# Patient Record
Sex: Male | Born: 1950 | Race: Black or African American | Hispanic: No | Smoking: Current every day smoker
Health system: Southern US, Community
[De-identification: ages and names within clinical notes are randomized; demographics above are authoritative.]

## PROBLEM LIST (undated history)

## (undated) DIAGNOSIS — I509 Heart failure, unspecified: Secondary | ICD-10-CM

## (undated) DIAGNOSIS — E119 Type 2 diabetes mellitus without complications: Secondary | ICD-10-CM

## (undated) DIAGNOSIS — I729 Aneurysm of unspecified site: Secondary | ICD-10-CM

## (undated) DIAGNOSIS — M1990 Unspecified osteoarthritis, unspecified site: Secondary | ICD-10-CM

## (undated) DIAGNOSIS — G4730 Sleep apnea, unspecified: Secondary | ICD-10-CM

## (undated) DIAGNOSIS — R011 Cardiac murmur, unspecified: Secondary | ICD-10-CM

## (undated) HISTORY — DX: Aneurysm of unspecified site: I72.9

## (undated) HISTORY — PX: BUNIONECTOMY: SHX129

---

## 2008-02-21 HISTORY — PX: CARDIAC CATHETERIZATION: SHX172

## 2008-05-09 ENCOUNTER — Inpatient Hospital Stay: Payer: Self-pay | Admitting: Internal Medicine

## 2009-05-25 ENCOUNTER — Encounter: Payer: Self-pay | Admitting: Internal Medicine

## 2010-02-20 HISTORY — PX: OTHER SURGICAL HISTORY: SHX169

## 2010-11-23 ENCOUNTER — Ambulatory Visit: Payer: Self-pay | Admitting: Gastroenterology

## 2012-05-10 ENCOUNTER — Other Ambulatory Visit: Payer: Self-pay | Admitting: Ophthalmology

## 2012-05-10 NOTE — H&P (Signed)
  Pre-operative History and Physical for Ophthalmic Surgery  Jeremy Gutierrez 05/10/2012                  Chief Complaint: Decreased Vision Right Eye  Diagnosis:  Combined Cataract  No known allergies  Prior to Admission medications   Not on File    Planned Procedure:                                       Phacoemulsification, Posterior Chamber Intra-ocular Lens Right Eye  There were no vitals filed for this visit.  Pulse: 74         Temp: NE        Resp:  18      ROS: negative   History   Social History  . Marital Status: N/A    Spouse Name: N/A    Number of Children: N/A  . Years of Education: N/A   Occupational History  . Not on file.   Social History Main Topics  . Smoking status: Not on file  . Smokeless tobacco: Not on file  . Alcohol Use: Not on file  . Drug Use: Not on file  . Sexually Active: Not on file   Other Topics Concern  . Not on file   Social History Narrative  . No narrative on file     The following examination is for anesthesia clearance for minimally invasive Ophthalmic surgery. It is primarily to document heart and lung findings and is not intended to elucidate unknown general medical conditions inclusive of abdominal masses, lung lesions, etc.   General Constitution:  within normal limits   Alertness/Orientation:  Person, time place     yes   HEENT:  Eye Findings:  Combined Cataract                   right eye  Neck: supple without masses  Chest/Lungs: clear to auscultation  Cardiac: Normal S1 and S2 without Murmur, S3 or S4  Neuro: non-focal  Impression:  Combined Cataract  Right Eye  Planned Procedure:  Phacoemulsification, Posterior Chamber Intraocular Lens OD    Shade Flood, MD

## 2012-06-03 ENCOUNTER — Encounter (HOSPITAL_COMMUNITY): Payer: Self-pay

## 2012-06-03 ENCOUNTER — Encounter (HOSPITAL_COMMUNITY): Payer: Self-pay | Admitting: *Deleted

## 2012-06-05 ENCOUNTER — Encounter (HOSPITAL_COMMUNITY)
Admission: RE | Disposition: A | Discharge status: Home / Self Care | Payer: Self-pay | Source: Ambulatory Visit | Attending: Ophthalmology

## 2012-06-05 ENCOUNTER — Ambulatory Visit (HOSPITAL_COMMUNITY)
Admission: RE | Admit: 2012-06-05 | Discharge: 2012-06-05 | Disposition: A | Discharge status: Home / Self Care | Payer: Medicare Other | Source: Ambulatory Visit | Attending: Ophthalmology | Admitting: Ophthalmology

## 2012-06-05 ENCOUNTER — Ambulatory Visit (HOSPITAL_COMMUNITY): Payer: Medicare Other | Admitting: Anesthesiology

## 2012-06-05 ENCOUNTER — Ambulatory Visit (HOSPITAL_COMMUNITY): Payer: Medicare Other

## 2012-06-05 ENCOUNTER — Encounter (HOSPITAL_COMMUNITY): Payer: Self-pay | Admitting: Anesthesiology

## 2012-06-05 DIAGNOSIS — H2510 Age-related nuclear cataract, unspecified eye: Secondary | ICD-10-CM | POA: Insufficient documentation

## 2012-06-05 DIAGNOSIS — H269 Unspecified cataract: Secondary | ICD-10-CM | POA: Insufficient documentation

## 2012-06-05 HISTORY — DX: Type 2 diabetes mellitus without complications: E11.9

## 2012-06-05 HISTORY — DX: Cardiac murmur, unspecified: R01.1

## 2012-06-05 HISTORY — DX: Unspecified osteoarthritis, unspecified site: M19.90

## 2012-06-05 HISTORY — DX: Sleep apnea, unspecified: G47.30

## 2012-06-05 HISTORY — PX: CATARACT EXTRACTION W/PHACO: SHX586

## 2012-06-05 HISTORY — DX: Heart failure, unspecified: I50.9

## 2012-06-05 LAB — BASIC METABOLIC PANEL
Calcium: 9.2 mg/dL (ref 8.4–10.5)
Creatinine, Ser: 1.15 mg/dL (ref 0.50–1.35)
GFR calc non Af Amer: 66 mL/min — ABNORMAL LOW (ref >90)
Sodium: 138 mEq/L (ref 135–145)

## 2012-06-05 LAB — CBC
MCH: 31.8 pg (ref 26.0–34.0)
MCHC: 35.6 g/dL (ref 30.0–36.0)
MCV: 89.2 fL (ref 78.0–100.0)
Platelets: 155 10*3/uL (ref 150–400)
RDW: 14 % (ref 11.5–15.5)

## 2012-06-05 LAB — GLUCOSE, CAPILLARY: Glucose-Capillary: 126 mg/dL — ABNORMAL HIGH (ref 70–99)

## 2012-06-05 SURGERY — PHACOEMULSIFICATION, CATARACT, WITH IOL INSERTION
Anesthesia: Monitor Anesthesia Care | Site: Eye | Laterality: Right | Wound class: Clean

## 2012-06-05 MED ORDER — PROVISC 10 MG/ML IO SOLN
INTRAOCULAR | Status: DC | PRN
  Administered 2012-06-05: 8.5 mg via INTRAOCULAR

## 2012-06-05 MED ORDER — DEXAMETHASONE SODIUM PHOSPHATE 10 MG/ML IJ SOLN
INTRAMUSCULAR | Status: DC | PRN
  Administered 2012-06-05: 10 mg via INTRAVENOUS

## 2012-06-05 MED ORDER — ONDANSETRON HCL 4 MG/2ML IJ SOLN: 4.0000 mg | Freq: Four times a day (QID) | INTRAMUSCULAR | Status: DC | PRN

## 2012-06-05 MED ORDER — PROPOFOL 10 MG/ML IV BOLUS
INTRAVENOUS | Status: DC | PRN
  Administered 2012-06-05: 20 mg via INTRAVENOUS
  Administered 2012-06-05: 40 mg via INTRAVENOUS

## 2012-06-05 MED ORDER — HYPROMELLOSE (GONIOSCOPIC) 2.5 % OP SOLN
OPHTHALMIC | Status: DC | PRN
  Administered 2012-06-05: 2 [drp] via OPHTHALMIC

## 2012-06-05 MED ORDER — SODIUM CHLORIDE 0.9 % IV SOLN
INTRAVENOUS | Status: DC | PRN
  Administered 2012-06-05: 12:00:00 via INTRAVENOUS

## 2012-06-05 MED ORDER — EPINEPHRINE HCL 1 MG/ML IJ SOLN
INTRAOCULAR | Status: DC | PRN
  Administered 2012-06-05: 12:00:00

## 2012-06-05 MED ORDER — OXYCODONE HCL 5 MG PO TABS: 5.0000 mg | ORAL_TABLET | Freq: Once | ORAL | Status: DC | PRN

## 2012-06-05 MED ORDER — NA CHONDROIT SULF-NA HYALURON 40-30 MG/ML IO SOLN
INTRAOCULAR | Status: DC | PRN
  Administered 2012-06-05: 0.5 mL via INTRAOCULAR

## 2012-06-05 MED ORDER — WATER FOR IRRIGATION, STERILE IR SOLN
Status: DC | PRN
  Administered 2012-06-05: 1000 mL

## 2012-06-05 MED ORDER — CEFAZOLIN SUBCONJUNCTIVAL INJECTION 100 MG/0.5 ML
INJECTION | SUBCONJUNCTIVAL | Status: DC | PRN
  Administered 2012-06-05: 200 mg via SUBCONJUNCTIVAL

## 2012-06-05 MED ORDER — BACITRACIN-POLYMYXIN B 500-10000 UNIT/GM OP OINT
TOPICAL_OINTMENT | OPHTHALMIC | Status: DC | PRN
  Administered 2012-06-05: 1 via OPHTHALMIC

## 2012-06-05 MED ORDER — FENTANYL CITRATE 0.05 MG/ML IJ SOLN: 25.0000 ug | INTRAMUSCULAR | Status: DC | PRN

## 2012-06-05 MED ORDER — PHENYLEPHRINE HCL 2.5 % OP SOLN
1.0000 [drp] | OPHTHALMIC | Status: AC | PRN
  Administered 2012-06-05 (×3): 1 [drp] via OPHTHALMIC
  Filled 2012-06-05: qty 3

## 2012-06-05 MED ORDER — LIDOCAINE HCL 2 % IJ SOLN
INTRAMUSCULAR | Status: DC | PRN
  Administered 2012-06-05: 12:00:00 via RETROBULBAR

## 2012-06-05 MED ORDER — TETRACAINE HCL 0.5 % OP SOLN
2.0000 [drp] | OPHTHALMIC | Status: AC
  Administered 2012-06-05: 2 [drp] via OPHTHALMIC
  Filled 2012-06-05: qty 2

## 2012-06-05 MED ORDER — GATIFLOXACIN 0.5 % OP SOLN
1.0000 [drp] | OPHTHALMIC | Status: AC | PRN
  Administered 2012-06-05 (×3): 1 [drp] via OPHTHALMIC
  Filled 2012-06-05 (×2): qty 2.5

## 2012-06-05 MED ORDER — CEFAZOLIN SUBCONJUNCTIVAL INJECTION 100 MG/0.5 ML
200.0000 mg | INJECTION | SUBCONJUNCTIVAL | Status: DC
  Filled 2012-06-05: qty 1

## 2012-06-05 MED ORDER — OXYCODONE HCL 5 MG/5ML PO SOLN: 5.0000 mg | Freq: Once | ORAL | Status: DC | PRN

## 2012-06-05 MED ORDER — PREDNISOLONE ACETATE 1 % OP SUSP
1.0000 [drp] | OPHTHALMIC | Status: AC
  Administered 2012-06-05: 1 [drp] via OPHTHALMIC
  Filled 2012-06-05 (×2): qty 5

## 2012-06-05 SURGICAL SUPPLY — 61 items
APPLICATOR COTTON TIP 6IN STRL (MISCELLANEOUS) ×2 IMPLANT
APPLICATOR DR MATTHEWS STRL (MISCELLANEOUS) ×2 IMPLANT
BAG MINI COLL DRAIN (WOUND CARE) ×2 IMPLANT
BLADE EYE MINI 60D BEAVER (BLADE) IMPLANT
BLADE KERATOME 2.75 (BLADE) ×2 IMPLANT
BLADE STAB KNIFE 15DEG (BLADE) IMPLANT
CANNULA ANTERIOR CHAMBER 27GA (MISCELLANEOUS) IMPLANT
CLOTH BEACON ORANGE TIMEOUT ST (SAFETY) ×2 IMPLANT
DRAPE OPHTHALMIC 77X100 STRL (CUSTOM PROCEDURE TRAY) ×2 IMPLANT
DRAPE POUCH INSTRU U-SHP 10X18 (DRAPES) ×2 IMPLANT
DRSG TEGADERM 4X4.75 (GAUZE/BANDAGES/DRESSINGS) ×2 IMPLANT
FILTER BLUE MILLIPORE (MISCELLANEOUS) IMPLANT
GLOVE ECLIPSE 7.5 STRL STRAW (GLOVE) ×2 IMPLANT
GLOVE SS BIOGEL STRL SZ 6.5 (GLOVE) ×2 IMPLANT
GLOVE SUPERSENSE BIOGEL SZ 6.5 (GLOVE) ×2
GOWN SRG XL XLNG 56XLVL 4 (GOWN DISPOSABLE) ×1 IMPLANT
GOWN STRL NON-REIN LRG LVL3 (GOWN DISPOSABLE) ×4 IMPLANT
GOWN STRL NON-REIN XL XLG LVL4 (GOWN DISPOSABLE) ×1
KIT BASIN OR (CUSTOM PROCEDURE TRAY) ×2 IMPLANT
KIT ROOM TURNOVER OR (KITS) IMPLANT
KNIFE GRIESHABER SHARP 2.5MM (MISCELLANEOUS) ×2 IMPLANT
LENS IOL ACRYSOF MP POST 21.0 (Intraocular Lens) ×2 IMPLANT
MASK EYE SHIELD (GAUZE/BANDAGES/DRESSINGS) ×2 IMPLANT
NEEDLE 18GX1X1/2 (RX/OR ONLY) (NEEDLE) ×4 IMPLANT
NEEDLE 22X1 1/2 (OR ONLY) (NEEDLE) ×2 IMPLANT
NEEDLE 25GX 5/8IN NON SAFETY (NEEDLE) ×2 IMPLANT
NEEDLE FILTER BLUNT 18X 1/2SAF (NEEDLE)
NEEDLE FILTER BLUNT 18X1 1/2 (NEEDLE) IMPLANT
NEEDLE HYPO 30X.5 LL (NEEDLE) ×4 IMPLANT
NS IRRIG 1000ML POUR BTL (IV SOLUTION) ×2 IMPLANT
PACK CATARACT CUSTOM (CUSTOM PROCEDURE TRAY) ×2 IMPLANT
PACK CATARACT MCHSCP (PACKS) ×2 IMPLANT
PACK COMBINED CATERACT/VIT 23G (OPHTHALMIC RELATED) IMPLANT
PAD ARMBOARD 7.5X6 YLW CONV (MISCELLANEOUS) ×4 IMPLANT
PAD EYE OVAL STERILE LF (GAUZE/BANDAGES/DRESSINGS) ×2 IMPLANT
PHACO TIP KELMAN 45DEG (TIP) ×2 IMPLANT
PROBE ANTERIOR 20G W/INFUS NDL (MISCELLANEOUS) IMPLANT
RING MALYGIN (MISCELLANEOUS) IMPLANT
ROLLS DENTAL (MISCELLANEOUS) IMPLANT
SHUTTLE MONARCH TYPE A (NEEDLE) ×2 IMPLANT
SOLUTION ANTI FOG 6CC (MISCELLANEOUS) IMPLANT
SPEAR EYE SURG WECK-CEL (MISCELLANEOUS) ×4 IMPLANT
SUT ETHILON 10-0 CS-B-6CS-B-6 (SUTURE)
SUT ETHILON 5 0 P 3 18 (SUTURE)
SUT ETHILON 9 0 TG140 8 (SUTURE) IMPLANT
SUT NYLON ETHILON 5-0 P-3 1X18 (SUTURE) IMPLANT
SUT PLAIN 6 0 TG1408 (SUTURE) IMPLANT
SUT POLY NON ABSORB 10-0 8 STR (SUTURE) IMPLANT
SUT VICRYL 6 0 S 29 12 (SUTURE) IMPLANT
SUTURE EHLN 10-0 CS-B-6CS-B-6 (SUTURE) IMPLANT
SYR 20CC LL (SYRINGE) IMPLANT
SYR 5ML LL (SYRINGE) IMPLANT
SYR TB 1ML LUER SLIP (SYRINGE) ×2 IMPLANT
SYRINGE 10CC LL (SYRINGE) IMPLANT
TAPE SURG TRANSPORE 1 IN (GAUZE/BANDAGES/DRESSINGS) ×1 IMPLANT
TAPE SURGICAL TRANSPORE 1 IN (GAUZE/BANDAGES/DRESSINGS) ×1
TIP ABS 45DEG FLARED 0.9MM (TIP) ×2 IMPLANT
TOWEL OR 17X24 6PK STRL BLUE (TOWEL DISPOSABLE) ×4 IMPLANT
WATER STERILE IRR 1000ML POUR (IV SOLUTION) ×2 IMPLANT
WIPE INSTRUMENT ADHESIVE BACK (MISCELLANEOUS) ×2 IMPLANT
WIPE INSTRUMENT VISIWIPE 73X73 (MISCELLANEOUS) ×2 IMPLANT

## 2012-06-05 NOTE — Interval H&P Note (Signed)
History and Physical Interval Note:  06/05/2012 11:44 AM  Jeremy Gutierrez  has presented today for surgery, with the diagnosis of Combined Cataract Right Eye  The various methods of treatment have been discussed with the patient and family. After consideration of risks, benefits and other options for treatment, the patient has consented to  Procedure(s): CATARACT EXTRACTION PHACO AND INTRAOCULAR LENS PLACEMENT (IOC) (Right) as a surgical intervention .  The patient's history has been reviewed, patient examined, no change in status, stable for surgery.  I have reviewed the patient's chart and labs.  Questions were answered to the patient's satisfaction.     Kenzie Thoreson, Waynette Buttery

## 2012-06-05 NOTE — Anesthesia Postprocedure Evaluation (Signed)
Anesthesia Post Note  Patient: Jeremy Gutierrez  Procedure(s) Performed: Procedure(s) (LRB): CATARACT EXTRACTION PHACO AND INTRAOCULAR LENS PLACEMENT (IOC) (Right)  Anesthesia type: MAC  Patient location: PACU  Post pain: Pain level controlled  Post assessment: Patient's Cardiovascular Status Stable  Last Vitals:  Filed Vitals:   06/05/12 1328  BP: 114/73  Pulse: 66  Temp: 36.4 C  Resp: 16    Post vital signs: Reviewed and stable  Level of consciousness: sedated  Complications: No apparent anesthesia complications

## 2012-06-05 NOTE — H&P (View-Only) (Signed)
  Pre-operative History and Physical for Ophthalmic Surgery  Jeremy Gutierrez 05/10/2012                  Chief Complaint: Decreased Vision Right Eye  Diagnosis:  Combined Cataract  No known allergies  Prior to Admission medications   Not on File    Planned Procedure:                                       Phacoemulsification, Posterior Chamber Intra-ocular Lens Right Eye  There were no vitals filed for this visit.  Pulse: 74         Temp: NE        Resp:  18      ROS: negative   History   Social History  . Marital Status: N/A    Spouse Name: N/A    Number of Children: N/A  . Years of Education: N/A   Occupational History  . Not on file.   Social History Main Topics  . Smoking status: Not on file  . Smokeless tobacco: Not on file  . Alcohol Use: Not on file  . Drug Use: Not on file  . Sexually Active: Not on file   Other Topics Concern  . Not on file   Social History Narrative  . No narrative on file     The following examination is for anesthesia clearance for minimally invasive Ophthalmic surgery. It is primarily to document heart and lung findings and is not intended to elucidate unknown general medical conditions inclusive of abdominal masses, lung lesions, etc.   General Constitution:  within normal limits   Alertness/Orientation:  Person, time place     yes   HEENT:  Eye Findings:  Combined Cataract                   right eye  Neck: supple without masses  Chest/Lungs: clear to auscultation  Cardiac: Normal S1 and S2 without Murmur, S3 or S4  Neuro: non-focal  Impression:  Combined Cataract  Right Eye  Planned Procedure:  Phacoemulsification, Posterior Chamber Intraocular Lens OD    Talar Fraley, MD       

## 2012-06-05 NOTE — Op Note (Signed)
Jeremy Gutierrez 06/05/2012 Cataract: Combined, Nuclear, Cortical and Posterior Subcapsular  Procedure: Phacoemulsification, Posterior Chamber Intra-ocular Lens Operative Eye:  right eye  Surgeon: Shade Flood Estimated Blood Loss: minimal Specimens for Pathology:  None Complications: none  The patient was prepared and draped in the usual manner for ocular surgery on the right eye. A Cook lid speculum was placed. A peripheral clear corneal incision was made at the surgical limbus centered at the 11:00 meridian. A separate clear corneal stab incision was made with a 15 degree blade at the 2:00 meridian to permit bi-manual technique. Viscoat and  Provisc as an underlying layer next to the capsule was instilled into the anterior chamber through that incision.  A keratome was used to create a self sealing incision entering the anterior chamber at the 11:00 meridian. A capsulorhexis was performed using a bent 25g needle. The lens was hydrodissected and the nucleus was hydrodilineated using a Nichammin cannula. The Chang chopper was inserted and used to rotate the lens to insure adequate lens mobility. The phacoemulsification handpiece was inserted and a combined phaco-chop technique was employed, fracturing the lens into separate sections with subsequent removal with the phaco handpiece.   The I/A cannula was used to remove remaining lens cortex. Provisc was instilled and used to deepen the anterior chamber and posterior capsule bag. The Monarch injector was used to place a folded Acrysof MA50BM PC IOL, + 21.00  diopters, into the capsule bag. A Sinskey lens hook was used to dial in the trailing haptic.  The I/A cannula was used to remove the viscoelastic from the anterior chamber. BSS was used to bring IOP to the desired range and the wound was checked to insure it was watertight. Subconjunctival injections of Ancef 100/0.6ml and Dexamethasone 0.5 ml of a 10mg /57ml solution were placed without complication.  The lid speculum and drapes were removed and the patient's eye was patched with Polymixin/Bacitracin ophthalmic ointment. An eye shield was placed and the patient was transferred alert and conversant from the operating room to the post-operative recovery area.   Shade Flood, MD

## 2012-06-05 NOTE — Anesthesia Preprocedure Evaluation (Signed)
Anesthesia Evaluation  Patient identified by MRN, date of birth, ID band Patient awake    Reviewed: Allergy & Precautions, H&P , NPO status , Patient's Chart, lab work & pertinent test results  Airway Mallampati: II  Neck ROM: full    Dental   Pulmonary sleep apnea , former smoker,          Cardiovascular +CHF     Neuro/Psych    GI/Hepatic   Endo/Other  diabetes, Type 2  Renal/GU      Musculoskeletal  (+) Arthritis -,   Abdominal   Peds  Hematology   Anesthesia Other Findings   Reproductive/Obstetrics                           Anesthesia Physical Anesthesia Plan  ASA: III  Anesthesia Plan: MAC   Post-op Pain Management:    Induction: Intravenous  Airway Management Planned: Nasal Cannula  Additional Equipment:   Intra-op Plan:   Post-operative Plan:   Informed Consent: I have reviewed the patients History and Physical, chart, labs and discussed the procedure including the risks, benefits and alternatives for the proposed anesthesia with the patient or authorized representative who has indicated his/her understanding and acceptance.     Plan Discussed with: Anesthesiologist, Surgeon and CRNA  Anesthesia Plan Comments:         Anesthesia Quick Evaluation

## 2012-06-05 NOTE — Transfer of Care (Signed)
Immediate Anesthesia Transfer of Care Note  Patient: Jeremy Gutierrez  Procedure(s) Performed: Procedure(s): CATARACT EXTRACTION PHACO AND INTRAOCULAR LENS PLACEMENT (IOC) (Right)  Patient Location: PACU and Short Stay  Anesthesia Type:MAC  Level of Consciousness: awake, alert  and oriented  Airway & Oxygen Therapy: Patient Spontanous Breathing  Post-op Assessment: Report given to PACU RN, Post -op Vital signs reviewed and stable and Patient moving all extremities  Post vital signs: Reviewed and stable  Complications: No apparent anesthesia complications

## 2012-06-05 NOTE — Preoperative (Signed)
Beta Blockers   Reason not to administer Beta Blockers:Not Applicable 

## 2012-06-05 NOTE — Interval H&P Note (Signed)
History and Physical Interval Note:  06/05/2012 10:25 AM  Jeremy Gutierrez  has presented today for surgery, with the diagnosis of Combined Cataract Right Eye  The various methods of treatment have been discussed with the patient and family. After consideration of risks, benefits and other options for treatment, the patient has consented to  Procedure(s): CATARACT EXTRACTION PHACO AND INTRAOCULAR LENS PLACEMENT (IOC) (Right) as a surgical intervention .  The patient's history has been reviewed, patient examined, no change in status, stable for surgery.  I have reviewed the patient's chart and labs.  Questions were answered to the patient's satisfaction.    The patient has a combined Nuclear, Cortical and Posterior Sub-capsular  Cataract with 4+ posterior subcapsular Cataract with best corrected acuity of 20/400 not improved with refraction. He does have Diabetic Retinopathy, but the vision is obscured for treating his retinopathy by the Cataract. He also reports poor vision in the right eye which is making him mis-step and have falls. He cannot see out of his right eye. He is also knocking over things when he reaches for them.   We discussed the risks, benefits and alternatives to planned Cataract surgery. We also discussed visual expectations. I shared that the Cataract obscures our ability to adequately treat and manage his retinopathy as well. He indicates understanding the indications for his surgery and desires to proceed with planned surgery with goal of vision improvement as well as facilitating care of his retinopathy.  AScan calculations were performed to assist selection of an appropriate intra-ocualar lens. The estimated lens power with the IOL Master was +21.00 diopter  using an Acrysof MA50BM implant lens.    Jeff Mccallum, Waynette Buttery

## 2012-06-06 ENCOUNTER — Encounter (HOSPITAL_COMMUNITY): Payer: Self-pay | Admitting: Ophthalmology

## 2012-10-29 ENCOUNTER — Encounter: Payer: Self-pay | Admitting: General Surgery

## 2012-10-29 ENCOUNTER — Ambulatory Visit (INDEPENDENT_AMBULATORY_CARE_PROVIDER_SITE_OTHER): Payer: Medicare Other | Admitting: General Surgery

## 2012-10-29 VITALS — BP 122/74 | HR 80 | Temp 97.8°F | Resp 12 | Ht 68.0 in | Wt 249.0 lb

## 2012-10-29 DIAGNOSIS — L02419 Cutaneous abscess of limb, unspecified: Secondary | ICD-10-CM

## 2012-10-29 DIAGNOSIS — L02415 Cutaneous abscess of right lower limb: Secondary | ICD-10-CM

## 2012-10-29 NOTE — Patient Instructions (Addendum)
Keep area clean Use pad as needed Finish antibiotic

## 2012-10-29 NOTE — Progress Notes (Signed)
Patient ID: Jeremy Gutierrez, male   DOB: 10/19/1950, 62 y.o.   MRN: [id number]  Chief Complaint  Patient presents with  . Rectal Pain    HPI Jeremy Gutierrez is a 62 y.o. male.  Patient here today for evaluation of buttock boil referred by Dr Maryellen Pile. States it has been there for about one week. States Dr. Maryellen Pile placed him on an antibiotic yesterday.  The area is painful and tender lower posteriorvpart of right buttock/upper thigh. States he gets "these boils" at various times and various locations through the year. HPI  Past Medical History  Diagnosis Date  . Diabetes mellitus without complication   . CHF (congestive heart failure)   . Heart murmur     as a child- 'nothing to worry about'  . Arthritis   . Sleep apnea   . Aneurysm     Past Surgical History  Procedure Laterality Date  . Bunionectomy Right   . Colonscopy  2012  . Cataract extraction w/phaco Right 06/05/2012    Procedure: CATARACT EXTRACTION PHACO AND INTRAOCULAR LENS PLACEMENT (IOC);  Surgeon: Shade Flood, MD;  Location: [facility] OR;  Service: Ophthalmology;  Laterality: Right;  . Cardiac catheterization  2010    History reviewed. No pertinent family history.  Social History History  Substance Use Topics  . Smoking status: Current Every Day Smoker -- 0.50 packs/day for 40 years    Types: Cigarettes    Last Attempt to Quit: 06/02/2012  . Smokeless tobacco: Not on file     Comment: smoked for many years.  . Alcohol Use: Yes     Comment: social    Allergies  Allergen Reactions  . Sulfa Antibiotics Hives    Current Outpatient Prescriptions  Medication Sig Dispense Refill  . aspirin 81 MG tablet Take 81 mg by mouth daily.      . carvedilol (COREG) 3.125 MG tablet Take 3.125 mg by mouth 2 (two) times daily with a meal.      . cephALEXin (KEFLEX) 500 MG capsule Take 500 mg by mouth 2 (two) times daily.      . furosemide (LASIX) 40 MG tablet Take 80 mg by mouth daily.      . insulin lispro protamine-lispro (HUMALOG 75/25)  (75-25) 100 UNIT/ML SUSP Inject 25-30 Units into the skin 2 (two) times daily. 30 morning and 25 at bedtime      . losartan (COZAAR) 50 MG tablet Take 50 mg by mouth daily.      . potassium chloride (K-DUR,KLOR-CON) 10 MEQ tablet Take 10 mEq by mouth daily.      . simvastatin (ZOCOR) 20 MG tablet Take 20 mg by mouth every evening.       No current facility-administered medications for this visit.    Review of Systems Review of Systems  Constitutional: Negative.   Respiratory: Negative.   Cardiovascular: Negative.   Gastrointestinal: Positive for rectal pain.    Blood pressure 122/74, pulse 80, temperature 97.8 F (36.6 C), temperature source Oral, resp. rate 12, height 5\' 8"  (1.727 m), weight 249 lb (112.946 kg).  Physical Exam Physical Exam  Constitutional: He is oriented to person, place, and time. He appears well-developed and well-nourished.  Neurological: He is alert and oriented to person, place, and time.  Skin: Skin is warm and dry.       Data Reviewed    Assessment    Skin abscess roight posterior thigh.     Plan    Drainage. Completed today  With pt consent.  After betadine prep 1 ml 1% xylocaine instilled over the central area of the right posterior thigh abscess. Cruciate incision made and 2 ml seropurulent fluid drained. Dressed with 4/4s. No immediate problems noted.       SANKAR,SEEPLAPUTHUR G 10/29/2012, 6:59 PM

## 2013-02-24 DIAGNOSIS — K3189 Other diseases of stomach and duodenum: Secondary | ICD-10-CM | POA: Diagnosis not present

## 2013-02-24 DIAGNOSIS — I10 Essential (primary) hypertension: Secondary | ICD-10-CM | POA: Diagnosis not present

## 2013-02-24 DIAGNOSIS — R1013 Epigastric pain: Secondary | ICD-10-CM | POA: Diagnosis not present

## 2013-02-24 DIAGNOSIS — I5023 Acute on chronic systolic (congestive) heart failure: Secondary | ICD-10-CM | POA: Diagnosis not present

## 2013-02-24 DIAGNOSIS — I119 Hypertensive heart disease without heart failure: Secondary | ICD-10-CM | POA: Diagnosis not present

## 2013-02-24 DIAGNOSIS — I270 Primary pulmonary hypertension: Secondary | ICD-10-CM | POA: Diagnosis not present

## 2013-02-24 DIAGNOSIS — I517 Cardiomegaly: Secondary | ICD-10-CM | POA: Diagnosis not present

## 2013-02-24 DIAGNOSIS — IMO0001 Reserved for inherently not codable concepts without codable children: Secondary | ICD-10-CM | POA: Diagnosis not present

## 2013-03-03 DIAGNOSIS — E11339 Type 2 diabetes mellitus with moderate nonproliferative diabetic retinopathy without macular edema: Secondary | ICD-10-CM | POA: Diagnosis not present

## 2013-03-03 DIAGNOSIS — E1139 Type 2 diabetes mellitus with other diabetic ophthalmic complication: Secondary | ICD-10-CM | POA: Diagnosis not present

## 2013-03-03 DIAGNOSIS — E11311 Type 2 diabetes mellitus with unspecified diabetic retinopathy with macular edema: Secondary | ICD-10-CM | POA: Diagnosis not present

## 2013-03-06 DIAGNOSIS — L708 Other acne: Secondary | ICD-10-CM | POA: Diagnosis not present

## 2013-03-06 DIAGNOSIS — Z79899 Other long term (current) drug therapy: Secondary | ICD-10-CM | POA: Diagnosis not present

## 2013-03-24 DIAGNOSIS — I119 Hypertensive heart disease without heart failure: Secondary | ICD-10-CM | POA: Diagnosis not present

## 2013-03-24 DIAGNOSIS — I270 Primary pulmonary hypertension: Secondary | ICD-10-CM | POA: Diagnosis not present

## 2013-03-24 DIAGNOSIS — R609 Edema, unspecified: Secondary | ICD-10-CM | POA: Diagnosis not present

## 2013-03-24 DIAGNOSIS — R0601 Orthopnea: Secondary | ICD-10-CM | POA: Diagnosis not present

## 2013-04-14 DIAGNOSIS — L708 Other acne: Secondary | ICD-10-CM | POA: Diagnosis not present

## 2013-04-14 DIAGNOSIS — Z79899 Other long term (current) drug therapy: Secondary | ICD-10-CM | POA: Diagnosis not present

## 2013-04-18 DIAGNOSIS — L708 Other acne: Secondary | ICD-10-CM | POA: Diagnosis not present

## 2013-05-19 DIAGNOSIS — L708 Other acne: Secondary | ICD-10-CM | POA: Diagnosis not present

## 2013-05-19 DIAGNOSIS — Z79899 Other long term (current) drug therapy: Secondary | ICD-10-CM | POA: Diagnosis not present

## 2013-05-21 DIAGNOSIS — L708 Other acne: Secondary | ICD-10-CM | POA: Diagnosis not present

## 2013-05-27 DIAGNOSIS — G4730 Sleep apnea, unspecified: Secondary | ICD-10-CM | POA: Diagnosis not present

## 2013-05-27 DIAGNOSIS — G4720 Circadian rhythm sleep disorder, unspecified type: Secondary | ICD-10-CM | POA: Diagnosis not present

## 2013-05-27 DIAGNOSIS — G4710 Hypersomnia, unspecified: Secondary | ICD-10-CM | POA: Diagnosis not present

## 2013-05-27 DIAGNOSIS — I059 Rheumatic mitral valve disease, unspecified: Secondary | ICD-10-CM | POA: Diagnosis not present

## 2013-06-02 DIAGNOSIS — E11339 Type 2 diabetes mellitus with moderate nonproliferative diabetic retinopathy without macular edema: Secondary | ICD-10-CM | POA: Diagnosis not present

## 2013-06-02 DIAGNOSIS — E11311 Type 2 diabetes mellitus with unspecified diabetic retinopathy with macular edema: Secondary | ICD-10-CM | POA: Diagnosis not present

## 2013-06-02 DIAGNOSIS — E1139 Type 2 diabetes mellitus with other diabetic ophthalmic complication: Secondary | ICD-10-CM | POA: Diagnosis not present

## 2013-06-20 DIAGNOSIS — L708 Other acne: Secondary | ICD-10-CM | POA: Diagnosis not present

## 2013-06-20 DIAGNOSIS — Z79899 Other long term (current) drug therapy: Secondary | ICD-10-CM | POA: Diagnosis not present

## 2013-06-23 DIAGNOSIS — L708 Other acne: Secondary | ICD-10-CM | POA: Diagnosis not present

## 2013-06-23 DIAGNOSIS — L819 Disorder of pigmentation, unspecified: Secondary | ICD-10-CM | POA: Diagnosis not present

## 2013-07-22 DIAGNOSIS — L708 Other acne: Secondary | ICD-10-CM | POA: Diagnosis not present

## 2013-07-22 DIAGNOSIS — Z79899 Other long term (current) drug therapy: Secondary | ICD-10-CM | POA: Diagnosis not present

## 2013-07-24 DIAGNOSIS — L708 Other acne: Secondary | ICD-10-CM | POA: Diagnosis not present

## 2013-07-30 DIAGNOSIS — E11339 Type 2 diabetes mellitus with moderate nonproliferative diabetic retinopathy without macular edema: Secondary | ICD-10-CM | POA: Diagnosis not present

## 2013-07-30 DIAGNOSIS — E11311 Type 2 diabetes mellitus with unspecified diabetic retinopathy with macular edema: Secondary | ICD-10-CM | POA: Diagnosis not present

## 2013-07-30 DIAGNOSIS — E1139 Type 2 diabetes mellitus with other diabetic ophthalmic complication: Secondary | ICD-10-CM | POA: Diagnosis not present

## 2013-08-13 DIAGNOSIS — I119 Hypertensive heart disease without heart failure: Secondary | ICD-10-CM | POA: Diagnosis not present

## 2013-08-13 DIAGNOSIS — IMO0001 Reserved for inherently not codable concepts without codable children: Secondary | ICD-10-CM | POA: Diagnosis not present

## 2013-08-13 DIAGNOSIS — E7800 Pure hypercholesterolemia, unspecified: Secondary | ICD-10-CM | POA: Diagnosis not present

## 2013-08-13 DIAGNOSIS — R972 Elevated prostate specific antigen [PSA]: Secondary | ICD-10-CM | POA: Diagnosis not present

## 2013-08-13 DIAGNOSIS — I270 Primary pulmonary hypertension: Secondary | ICD-10-CM | POA: Diagnosis not present

## 2013-09-01 DIAGNOSIS — L708 Other acne: Secondary | ICD-10-CM | POA: Diagnosis not present

## 2013-09-01 DIAGNOSIS — Z79899 Other long term (current) drug therapy: Secondary | ICD-10-CM | POA: Diagnosis not present

## 2013-09-16 DIAGNOSIS — L708 Other acne: Secondary | ICD-10-CM | POA: Diagnosis not present

## 2013-09-25 DIAGNOSIS — E1139 Type 2 diabetes mellitus with other diabetic ophthalmic complication: Secondary | ICD-10-CM | POA: Diagnosis not present

## 2013-09-25 DIAGNOSIS — E11339 Type 2 diabetes mellitus with moderate nonproliferative diabetic retinopathy without macular edema: Secondary | ICD-10-CM | POA: Diagnosis not present

## 2013-09-25 DIAGNOSIS — E11311 Type 2 diabetes mellitus with unspecified diabetic retinopathy with macular edema: Secondary | ICD-10-CM | POA: Diagnosis not present

## 2013-10-13 DIAGNOSIS — I10 Essential (primary) hypertension: Secondary | ICD-10-CM | POA: Diagnosis not present

## 2013-10-13 DIAGNOSIS — E1165 Type 2 diabetes mellitus with hyperglycemia: Secondary | ICD-10-CM | POA: Diagnosis not present

## 2013-10-13 DIAGNOSIS — E782 Mixed hyperlipidemia: Secondary | ICD-10-CM | POA: Diagnosis not present

## 2013-10-13 DIAGNOSIS — IMO0002 Reserved for concepts with insufficient information to code with codable children: Secondary | ICD-10-CM | POA: Diagnosis not present

## 2013-10-13 DIAGNOSIS — I2510 Atherosclerotic heart disease of native coronary artery without angina pectoris: Secondary | ICD-10-CM | POA: Diagnosis not present

## 2013-10-17 DIAGNOSIS — Z79899 Other long term (current) drug therapy: Secondary | ICD-10-CM | POA: Diagnosis not present

## 2013-10-17 DIAGNOSIS — L708 Other acne: Secondary | ICD-10-CM | POA: Diagnosis not present

## 2013-10-20 DIAGNOSIS — L280 Lichen simplex chronicus: Secondary | ICD-10-CM | POA: Diagnosis not present

## 2013-10-20 DIAGNOSIS — L981 Factitial dermatitis: Secondary | ICD-10-CM | POA: Diagnosis not present

## 2013-10-20 DIAGNOSIS — L708 Other acne: Secondary | ICD-10-CM | POA: Diagnosis not present

## 2013-10-28 DIAGNOSIS — I119 Hypertensive heart disease without heart failure: Secondary | ICD-10-CM | POA: Diagnosis not present

## 2013-10-28 DIAGNOSIS — E162 Hypoglycemia, unspecified: Secondary | ICD-10-CM | POA: Diagnosis not present

## 2013-10-28 DIAGNOSIS — I270 Primary pulmonary hypertension: Secondary | ICD-10-CM | POA: Diagnosis not present

## 2013-10-28 DIAGNOSIS — IMO0001 Reserved for inherently not codable concepts without codable children: Secondary | ICD-10-CM | POA: Diagnosis not present

## 2013-11-20 DIAGNOSIS — E10331 Type 1 diabetes mellitus with moderate nonproliferative diabetic retinopathy with macular edema: Secondary | ICD-10-CM | POA: Diagnosis not present

## 2013-11-20 DIAGNOSIS — H25811 Combined forms of age-related cataract, right eye: Secondary | ICD-10-CM | POA: Diagnosis not present

## 2013-11-20 DIAGNOSIS — H4611 Retrobulbar neuritis, right eye: Secondary | ICD-10-CM | POA: Diagnosis not present

## 2013-11-20 DIAGNOSIS — H40011 Open angle with borderline findings, low risk, right eye: Secondary | ICD-10-CM | POA: Diagnosis not present

## 2013-11-25 DIAGNOSIS — G4733 Obstructive sleep apnea (adult) (pediatric): Secondary | ICD-10-CM | POA: Diagnosis not present

## 2013-11-25 DIAGNOSIS — J300 Vasomotor rhinitis: Secondary | ICD-10-CM | POA: Diagnosis not present

## 2013-12-24 DIAGNOSIS — I129 Hypertensive chronic kidney disease with stage 1 through stage 4 chronic kidney disease, or unspecified chronic kidney disease: Secondary | ICD-10-CM | POA: Diagnosis not present

## 2013-12-24 DIAGNOSIS — E1122 Type 2 diabetes mellitus with diabetic chronic kidney disease: Secondary | ICD-10-CM | POA: Diagnosis not present

## 2013-12-24 DIAGNOSIS — N183 Chronic kidney disease, stage 3 (moderate): Secondary | ICD-10-CM | POA: Diagnosis not present

## 2014-01-09 DIAGNOSIS — I714 Abdominal aortic aneurysm, without rupture: Secondary | ICD-10-CM | POA: Diagnosis not present

## 2014-01-19 DIAGNOSIS — L728 Other follicular cysts of the skin and subcutaneous tissue: Secondary | ICD-10-CM | POA: Diagnosis not present

## 2014-01-19 DIAGNOSIS — L700 Acne vulgaris: Secondary | ICD-10-CM | POA: Diagnosis not present

## 2014-02-10 DIAGNOSIS — E11331 Type 2 diabetes mellitus with moderate nonproliferative diabetic retinopathy with macular edema: Secondary | ICD-10-CM | POA: Diagnosis not present

## 2014-02-10 DIAGNOSIS — H4611 Retrobulbar neuritis, right eye: Secondary | ICD-10-CM | POA: Diagnosis not present

## 2014-02-10 DIAGNOSIS — H25811 Combined forms of age-related cataract, right eye: Secondary | ICD-10-CM | POA: Diagnosis not present

## 2014-02-10 DIAGNOSIS — H40011 Open angle with borderline findings, low risk, right eye: Secondary | ICD-10-CM | POA: Diagnosis not present

## 2014-03-02 DIAGNOSIS — L732 Hidradenitis suppurativa: Secondary | ICD-10-CM | POA: Diagnosis not present

## 2014-03-02 DIAGNOSIS — L700 Acne vulgaris: Secondary | ICD-10-CM | POA: Diagnosis not present

## 2014-03-02 DIAGNOSIS — Z79899 Other long term (current) drug therapy: Secondary | ICD-10-CM | POA: Diagnosis not present

## 2014-03-02 DIAGNOSIS — L819 Disorder of pigmentation, unspecified: Secondary | ICD-10-CM | POA: Diagnosis not present

## 2014-03-02 DIAGNOSIS — L280 Lichen simplex chronicus: Secondary | ICD-10-CM | POA: Diagnosis not present

## 2014-03-16 DIAGNOSIS — N183 Chronic kidney disease, stage 3 (moderate): Secondary | ICD-10-CM | POA: Diagnosis not present

## 2014-03-16 DIAGNOSIS — I270 Primary pulmonary hypertension: Secondary | ICD-10-CM | POA: Diagnosis not present

## 2014-03-16 DIAGNOSIS — E1122 Type 2 diabetes mellitus with diabetic chronic kidney disease: Secondary | ICD-10-CM | POA: Diagnosis not present

## 2014-03-16 DIAGNOSIS — Z794 Long term (current) use of insulin: Secondary | ICD-10-CM | POA: Diagnosis not present

## 2014-03-16 DIAGNOSIS — N182 Chronic kidney disease, stage 2 (mild): Secondary | ICD-10-CM | POA: Diagnosis not present

## 2014-04-01 DIAGNOSIS — R600 Localized edema: Secondary | ICD-10-CM | POA: Diagnosis not present

## 2014-04-01 DIAGNOSIS — I429 Cardiomyopathy, unspecified: Secondary | ICD-10-CM | POA: Diagnosis not present

## 2014-04-01 DIAGNOSIS — L700 Acne vulgaris: Secondary | ICD-10-CM | POA: Diagnosis not present

## 2014-04-01 DIAGNOSIS — R0602 Shortness of breath: Secondary | ICD-10-CM | POA: Diagnosis not present

## 2014-04-01 DIAGNOSIS — I5043 Acute on chronic combined systolic (congestive) and diastolic (congestive) heart failure: Secondary | ICD-10-CM | POA: Diagnosis not present

## 2014-04-01 DIAGNOSIS — Z79899 Other long term (current) drug therapy: Secondary | ICD-10-CM | POA: Diagnosis not present

## 2014-04-02 DIAGNOSIS — L98491 Non-pressure chronic ulcer of skin of other sites limited to breakdown of skin: Secondary | ICD-10-CM | POA: Diagnosis not present

## 2014-04-02 DIAGNOSIS — L700 Acne vulgaris: Secondary | ICD-10-CM | POA: Diagnosis not present

## 2014-04-02 DIAGNOSIS — L720 Epidermal cyst: Secondary | ICD-10-CM | POA: Diagnosis not present

## 2014-04-02 DIAGNOSIS — L910 Hypertrophic scar: Secondary | ICD-10-CM | POA: Diagnosis not present

## 2014-04-02 DIAGNOSIS — Z79899 Other long term (current) drug therapy: Secondary | ICD-10-CM | POA: Diagnosis not present

## 2014-04-15 DIAGNOSIS — Z794 Long term (current) use of insulin: Secondary | ICD-10-CM | POA: Diagnosis not present

## 2014-04-15 DIAGNOSIS — N183 Chronic kidney disease, stage 3 (moderate): Secondary | ICD-10-CM | POA: Diagnosis not present

## 2014-04-15 DIAGNOSIS — E1122 Type 2 diabetes mellitus with diabetic chronic kidney disease: Secondary | ICD-10-CM | POA: Diagnosis not present

## 2014-04-30 DIAGNOSIS — E784 Other hyperlipidemia: Secondary | ICD-10-CM | POA: Diagnosis not present

## 2014-04-30 DIAGNOSIS — I2510 Atherosclerotic heart disease of native coronary artery without angina pectoris: Secondary | ICD-10-CM | POA: Diagnosis not present

## 2014-04-30 DIAGNOSIS — I429 Cardiomyopathy, unspecified: Secondary | ICD-10-CM | POA: Diagnosis not present

## 2014-04-30 DIAGNOSIS — I5042 Chronic combined systolic (congestive) and diastolic (congestive) heart failure: Secondary | ICD-10-CM | POA: Diagnosis not present

## 2014-05-01 DIAGNOSIS — L700 Acne vulgaris: Secondary | ICD-10-CM | POA: Diagnosis not present

## 2014-05-01 DIAGNOSIS — Z79899 Other long term (current) drug therapy: Secondary | ICD-10-CM | POA: Diagnosis not present

## 2014-05-04 DIAGNOSIS — Z79899 Other long term (current) drug therapy: Secondary | ICD-10-CM | POA: Diagnosis not present

## 2014-05-04 DIAGNOSIS — L720 Epidermal cyst: Secondary | ICD-10-CM | POA: Diagnosis not present

## 2014-05-04 DIAGNOSIS — L732 Hidradenitis suppurativa: Secondary | ICD-10-CM | POA: Diagnosis not present

## 2014-05-04 DIAGNOSIS — L700 Acne vulgaris: Secondary | ICD-10-CM | POA: Diagnosis not present

## 2014-05-04 DIAGNOSIS — L819 Disorder of pigmentation, unspecified: Secondary | ICD-10-CM | POA: Diagnosis not present

## 2014-05-07 DIAGNOSIS — H4611 Retrobulbar neuritis, right eye: Secondary | ICD-10-CM | POA: Diagnosis not present

## 2014-05-07 DIAGNOSIS — E11331 Type 2 diabetes mellitus with moderate nonproliferative diabetic retinopathy with macular edema: Secondary | ICD-10-CM | POA: Diagnosis not present

## 2014-05-07 DIAGNOSIS — H40011 Open angle with borderline findings, low risk, right eye: Secondary | ICD-10-CM | POA: Diagnosis not present

## 2014-05-07 DIAGNOSIS — H25811 Combined forms of age-related cataract, right eye: Secondary | ICD-10-CM | POA: Diagnosis not present

## 2014-05-26 DIAGNOSIS — J300 Vasomotor rhinitis: Secondary | ICD-10-CM | POA: Diagnosis not present

## 2014-05-26 DIAGNOSIS — G4733 Obstructive sleep apnea (adult) (pediatric): Secondary | ICD-10-CM | POA: Diagnosis not present

## 2014-05-26 DIAGNOSIS — F17210 Nicotine dependence, cigarettes, uncomplicated: Secondary | ICD-10-CM | POA: Diagnosis not present

## 2014-05-27 DIAGNOSIS — E119 Type 2 diabetes mellitus without complications: Secondary | ICD-10-CM | POA: Diagnosis not present

## 2014-06-05 DIAGNOSIS — Z79899 Other long term (current) drug therapy: Secondary | ICD-10-CM | POA: Diagnosis not present

## 2014-06-05 DIAGNOSIS — L700 Acne vulgaris: Secondary | ICD-10-CM | POA: Diagnosis not present

## 2014-06-08 DIAGNOSIS — R21 Rash and other nonspecific skin eruption: Secondary | ICD-10-CM | POA: Diagnosis not present

## 2014-06-08 DIAGNOSIS — Z79899 Other long term (current) drug therapy: Secondary | ICD-10-CM | POA: Diagnosis not present

## 2014-06-08 DIAGNOSIS — L701 Acne conglobata: Secondary | ICD-10-CM | POA: Diagnosis not present

## 2014-06-08 DIAGNOSIS — L300 Nummular dermatitis: Secondary | ICD-10-CM | POA: Diagnosis not present

## 2014-06-08 DIAGNOSIS — L732 Hidradenitis suppurativa: Secondary | ICD-10-CM | POA: Diagnosis not present

## 2014-06-08 DIAGNOSIS — L0291 Cutaneous abscess, unspecified: Secondary | ICD-10-CM | POA: Diagnosis not present

## 2014-07-06 DIAGNOSIS — L700 Acne vulgaris: Secondary | ICD-10-CM | POA: Diagnosis not present

## 2014-07-06 DIAGNOSIS — Z79899 Other long term (current) drug therapy: Secondary | ICD-10-CM | POA: Diagnosis not present

## 2014-07-08 DIAGNOSIS — Z79899 Other long term (current) drug therapy: Secondary | ICD-10-CM | POA: Diagnosis not present

## 2014-07-08 DIAGNOSIS — L700 Acne vulgaris: Secondary | ICD-10-CM | POA: Diagnosis not present

## 2014-07-08 DIAGNOSIS — L732 Hidradenitis suppurativa: Secondary | ICD-10-CM | POA: Diagnosis not present

## 2014-07-15 DIAGNOSIS — I272 Other secondary pulmonary hypertension: Secondary | ICD-10-CM | POA: Diagnosis not present

## 2014-07-15 DIAGNOSIS — Z794 Long term (current) use of insulin: Secondary | ICD-10-CM | POA: Diagnosis not present

## 2014-07-15 DIAGNOSIS — E1165 Type 2 diabetes mellitus with hyperglycemia: Secondary | ICD-10-CM | POA: Diagnosis not present

## 2014-07-15 DIAGNOSIS — N401 Enlarged prostate with lower urinary tract symptoms: Secondary | ICD-10-CM | POA: Diagnosis not present

## 2014-07-24 DIAGNOSIS — I272 Other secondary pulmonary hypertension: Secondary | ICD-10-CM | POA: Diagnosis not present

## 2014-08-06 DIAGNOSIS — Z79899 Other long term (current) drug therapy: Secondary | ICD-10-CM | POA: Diagnosis not present

## 2014-08-06 DIAGNOSIS — L700 Acne vulgaris: Secondary | ICD-10-CM | POA: Diagnosis not present

## 2014-08-10 DIAGNOSIS — L0291 Cutaneous abscess, unspecified: Secondary | ICD-10-CM | POA: Diagnosis not present

## 2014-08-10 DIAGNOSIS — L708 Other acne: Secondary | ICD-10-CM | POA: Diagnosis not present

## 2014-08-10 DIAGNOSIS — L732 Hidradenitis suppurativa: Secondary | ICD-10-CM | POA: Diagnosis not present

## 2014-08-10 DIAGNOSIS — Z79899 Other long term (current) drug therapy: Secondary | ICD-10-CM | POA: Diagnosis not present

## 2014-08-10 DIAGNOSIS — L720 Epidermal cyst: Secondary | ICD-10-CM | POA: Diagnosis not present

## 2014-08-27 DIAGNOSIS — H25811 Combined forms of age-related cataract, right eye: Secondary | ICD-10-CM | POA: Diagnosis not present

## 2014-08-27 DIAGNOSIS — E11331 Type 2 diabetes mellitus with moderate nonproliferative diabetic retinopathy with macular edema: Secondary | ICD-10-CM | POA: Diagnosis not present

## 2014-08-27 DIAGNOSIS — H4611 Retrobulbar neuritis, right eye: Secondary | ICD-10-CM | POA: Diagnosis not present

## 2014-08-27 DIAGNOSIS — H40011 Open angle with borderline findings, low risk, right eye: Secondary | ICD-10-CM | POA: Diagnosis not present

## 2014-09-08 DIAGNOSIS — Z79899 Other long term (current) drug therapy: Secondary | ICD-10-CM | POA: Diagnosis not present

## 2014-09-08 DIAGNOSIS — L700 Acne vulgaris: Secondary | ICD-10-CM | POA: Diagnosis not present

## 2014-09-10 DIAGNOSIS — L732 Hidradenitis suppurativa: Secondary | ICD-10-CM | POA: Diagnosis not present

## 2014-09-10 DIAGNOSIS — L700 Acne vulgaris: Secondary | ICD-10-CM | POA: Diagnosis not present

## 2014-09-10 DIAGNOSIS — L0291 Cutaneous abscess, unspecified: Secondary | ICD-10-CM | POA: Diagnosis not present

## 2014-09-10 DIAGNOSIS — Z79899 Other long term (current) drug therapy: Secondary | ICD-10-CM | POA: Diagnosis not present

## 2014-10-14 DIAGNOSIS — I272 Other secondary pulmonary hypertension: Secondary | ICD-10-CM | POA: Diagnosis not present

## 2014-10-16 DIAGNOSIS — Z79899 Other long term (current) drug therapy: Secondary | ICD-10-CM | POA: Diagnosis not present

## 2014-10-16 DIAGNOSIS — L700 Acne vulgaris: Secondary | ICD-10-CM | POA: Diagnosis not present

## 2014-10-19 DIAGNOSIS — L720 Epidermal cyst: Secondary | ICD-10-CM | POA: Diagnosis not present

## 2014-10-19 DIAGNOSIS — L732 Hidradenitis suppurativa: Secondary | ICD-10-CM | POA: Diagnosis not present

## 2014-10-19 DIAGNOSIS — Z79899 Other long term (current) drug therapy: Secondary | ICD-10-CM | POA: Diagnosis not present

## 2014-10-19 DIAGNOSIS — L0291 Cutaneous abscess, unspecified: Secondary | ICD-10-CM | POA: Diagnosis not present

## 2014-10-29 DIAGNOSIS — I272 Other secondary pulmonary hypertension: Secondary | ICD-10-CM | POA: Diagnosis not present

## 2014-10-29 DIAGNOSIS — N289 Disorder of kidney and ureter, unspecified: Secondary | ICD-10-CM | POA: Diagnosis not present

## 2014-10-29 DIAGNOSIS — F17200 Nicotine dependence, unspecified, uncomplicated: Secondary | ICD-10-CM | POA: Diagnosis not present

## 2014-10-29 DIAGNOSIS — I10 Essential (primary) hypertension: Secondary | ICD-10-CM | POA: Diagnosis not present

## 2014-10-29 DIAGNOSIS — I5032 Chronic diastolic (congestive) heart failure: Secondary | ICD-10-CM | POA: Diagnosis not present

## 2014-10-29 DIAGNOSIS — R600 Localized edema: Secondary | ICD-10-CM | POA: Diagnosis not present

## 2014-10-29 DIAGNOSIS — E785 Hyperlipidemia, unspecified: Secondary | ICD-10-CM | POA: Diagnosis not present

## 2014-10-29 DIAGNOSIS — E119 Type 2 diabetes mellitus without complications: Secondary | ICD-10-CM | POA: Diagnosis not present

## 2014-11-11 DIAGNOSIS — L700 Acne vulgaris: Secondary | ICD-10-CM | POA: Diagnosis not present

## 2014-11-11 DIAGNOSIS — Z79899 Other long term (current) drug therapy: Secondary | ICD-10-CM | POA: Diagnosis not present

## 2014-11-19 DIAGNOSIS — Z79899 Other long term (current) drug therapy: Secondary | ICD-10-CM | POA: Diagnosis not present

## 2014-11-19 DIAGNOSIS — L732 Hidradenitis suppurativa: Secondary | ICD-10-CM | POA: Diagnosis not present

## 2014-11-20 DIAGNOSIS — N289 Disorder of kidney and ureter, unspecified: Secondary | ICD-10-CM | POA: Diagnosis not present

## 2014-11-20 DIAGNOSIS — R079 Chest pain, unspecified: Secondary | ICD-10-CM | POA: Diagnosis not present

## 2014-12-01 DIAGNOSIS — G4733 Obstructive sleep apnea (adult) (pediatric): Secondary | ICD-10-CM | POA: Diagnosis not present

## 2014-12-01 DIAGNOSIS — J309 Allergic rhinitis, unspecified: Secondary | ICD-10-CM | POA: Diagnosis not present

## 2014-12-01 DIAGNOSIS — F17210 Nicotine dependence, cigarettes, uncomplicated: Secondary | ICD-10-CM | POA: Diagnosis not present

## 2014-12-07 DIAGNOSIS — R319 Hematuria, unspecified: Secondary | ICD-10-CM | POA: Diagnosis not present

## 2014-12-07 DIAGNOSIS — E1122 Type 2 diabetes mellitus with diabetic chronic kidney disease: Secondary | ICD-10-CM | POA: Diagnosis not present

## 2014-12-07 DIAGNOSIS — E785 Hyperlipidemia, unspecified: Secondary | ICD-10-CM | POA: Diagnosis not present

## 2014-12-07 DIAGNOSIS — E559 Vitamin D deficiency, unspecified: Secondary | ICD-10-CM | POA: Diagnosis not present

## 2014-12-07 DIAGNOSIS — I10 Essential (primary) hypertension: Secondary | ICD-10-CM | POA: Diagnosis not present

## 2014-12-07 DIAGNOSIS — R609 Edema, unspecified: Secondary | ICD-10-CM | POA: Diagnosis not present

## 2014-12-07 DIAGNOSIS — R809 Proteinuria, unspecified: Secondary | ICD-10-CM | POA: Diagnosis not present

## 2014-12-09 DIAGNOSIS — R809 Proteinuria, unspecified: Secondary | ICD-10-CM | POA: Diagnosis not present

## 2014-12-09 DIAGNOSIS — N183 Chronic kidney disease, stage 3 (moderate): Secondary | ICD-10-CM | POA: Diagnosis not present

## 2014-12-21 DIAGNOSIS — E785 Hyperlipidemia, unspecified: Secondary | ICD-10-CM | POA: Diagnosis not present

## 2014-12-21 DIAGNOSIS — I10 Essential (primary) hypertension: Secondary | ICD-10-CM | POA: Diagnosis not present

## 2014-12-21 DIAGNOSIS — N183 Chronic kidney disease, stage 3 (moderate): Secondary | ICD-10-CM | POA: Diagnosis not present

## 2014-12-21 DIAGNOSIS — R319 Hematuria, unspecified: Secondary | ICD-10-CM | POA: Diagnosis not present

## 2014-12-21 DIAGNOSIS — E1129 Type 2 diabetes mellitus with other diabetic kidney complication: Secondary | ICD-10-CM | POA: Diagnosis not present

## 2014-12-21 DIAGNOSIS — E875 Hyperkalemia: Secondary | ICD-10-CM | POA: Diagnosis not present

## 2014-12-21 DIAGNOSIS — R809 Proteinuria, unspecified: Secondary | ICD-10-CM | POA: Diagnosis not present

## 2014-12-28 DIAGNOSIS — L905 Scar conditions and fibrosis of skin: Secondary | ICD-10-CM | POA: Diagnosis not present

## 2014-12-28 DIAGNOSIS — L732 Hidradenitis suppurativa: Secondary | ICD-10-CM | POA: Diagnosis not present

## 2014-12-28 DIAGNOSIS — L280 Lichen simplex chronicus: Secondary | ICD-10-CM | POA: Diagnosis not present

## 2014-12-29 DIAGNOSIS — I10 Essential (primary) hypertension: Secondary | ICD-10-CM | POA: Diagnosis not present

## 2014-12-29 DIAGNOSIS — N183 Chronic kidney disease, stage 3 (moderate): Secondary | ICD-10-CM | POA: Diagnosis not present

## 2014-12-31 DIAGNOSIS — H25811 Combined forms of age-related cataract, right eye: Secondary | ICD-10-CM | POA: Diagnosis not present

## 2014-12-31 DIAGNOSIS — H4611 Retrobulbar neuritis, right eye: Secondary | ICD-10-CM | POA: Diagnosis not present

## 2014-12-31 DIAGNOSIS — E083313 Diabetes mellitus due to underlying condition with moderate nonproliferative diabetic retinopathy with macular edema, bilateral: Secondary | ICD-10-CM | POA: Diagnosis not present

## 2014-12-31 DIAGNOSIS — H40011 Open angle with borderline findings, low risk, right eye: Secondary | ICD-10-CM | POA: Diagnosis not present

## 2015-01-11 DIAGNOSIS — E119 Type 2 diabetes mellitus without complications: Secondary | ICD-10-CM | POA: Diagnosis not present

## 2015-01-11 DIAGNOSIS — I272 Other secondary pulmonary hypertension: Secondary | ICD-10-CM | POA: Diagnosis not present

## 2015-01-11 DIAGNOSIS — I119 Hypertensive heart disease without heart failure: Secondary | ICD-10-CM | POA: Diagnosis not present

## 2015-01-12 DIAGNOSIS — I714 Abdominal aortic aneurysm, without rupture: Secondary | ICD-10-CM | POA: Diagnosis not present

## 2015-01-12 DIAGNOSIS — I10 Essential (primary) hypertension: Secondary | ICD-10-CM | POA: Diagnosis not present

## 2015-02-08 DIAGNOSIS — L905 Scar conditions and fibrosis of skin: Secondary | ICD-10-CM | POA: Diagnosis not present

## 2015-02-08 DIAGNOSIS — L732 Hidradenitis suppurativa: Secondary | ICD-10-CM | POA: Diagnosis not present

## 2015-02-08 DIAGNOSIS — R208 Other disturbances of skin sensation: Secondary | ICD-10-CM | POA: Diagnosis not present

## 2015-02-08 DIAGNOSIS — L819 Disorder of pigmentation, unspecified: Secondary | ICD-10-CM | POA: Diagnosis not present

## 2015-02-08 DIAGNOSIS — L720 Epidermal cyst: Secondary | ICD-10-CM | POA: Diagnosis not present

## 2015-03-22 DIAGNOSIS — I10 Essential (primary) hypertension: Secondary | ICD-10-CM | POA: Diagnosis not present

## 2015-03-22 DIAGNOSIS — E1129 Type 2 diabetes mellitus with other diabetic kidney complication: Secondary | ICD-10-CM | POA: Diagnosis not present

## 2015-03-22 DIAGNOSIS — E559 Vitamin D deficiency, unspecified: Secondary | ICD-10-CM | POA: Diagnosis not present

## 2015-03-22 DIAGNOSIS — R809 Proteinuria, unspecified: Secondary | ICD-10-CM | POA: Diagnosis not present

## 2015-04-15 DIAGNOSIS — E119 Type 2 diabetes mellitus without complications: Secondary | ICD-10-CM | POA: Diagnosis not present

## 2015-04-15 DIAGNOSIS — I119 Hypertensive heart disease without heart failure: Secondary | ICD-10-CM | POA: Diagnosis not present

## 2015-04-22 DIAGNOSIS — I5042 Chronic combined systolic (congestive) and diastolic (congestive) heart failure: Secondary | ICD-10-CM | POA: Diagnosis not present

## 2015-04-22 DIAGNOSIS — R600 Localized edema: Secondary | ICD-10-CM | POA: Diagnosis not present

## 2015-04-22 DIAGNOSIS — R0602 Shortness of breath: Secondary | ICD-10-CM | POA: Diagnosis not present

## 2015-04-22 DIAGNOSIS — R011 Cardiac murmur, unspecified: Secondary | ICD-10-CM | POA: Diagnosis not present

## 2015-04-22 DIAGNOSIS — I429 Cardiomyopathy, unspecified: Secondary | ICD-10-CM | POA: Diagnosis not present

## 2015-04-22 DIAGNOSIS — I272 Other secondary pulmonary hypertension: Secondary | ICD-10-CM | POA: Diagnosis not present

## 2015-04-22 DIAGNOSIS — I208 Other forms of angina pectoris: Secondary | ICD-10-CM | POA: Diagnosis not present

## 2015-04-22 DIAGNOSIS — I2510 Atherosclerotic heart disease of native coronary artery without angina pectoris: Secondary | ICD-10-CM | POA: Diagnosis not present

## 2015-05-05 DIAGNOSIS — I272 Other secondary pulmonary hypertension: Secondary | ICD-10-CM | POA: Diagnosis not present

## 2015-05-05 DIAGNOSIS — R0602 Shortness of breath: Secondary | ICD-10-CM | POA: Diagnosis not present

## 2015-05-05 DIAGNOSIS — I2510 Atherosclerotic heart disease of native coronary artery without angina pectoris: Secondary | ICD-10-CM | POA: Diagnosis not present

## 2015-05-05 DIAGNOSIS — I429 Cardiomyopathy, unspecified: Secondary | ICD-10-CM | POA: Diagnosis not present

## 2015-05-05 DIAGNOSIS — I208 Other forms of angina pectoris: Secondary | ICD-10-CM | POA: Diagnosis not present

## 2015-05-17 DIAGNOSIS — Z794 Long term (current) use of insulin: Secondary | ICD-10-CM | POA: Diagnosis not present

## 2015-05-17 DIAGNOSIS — G4733 Obstructive sleep apnea (adult) (pediatric): Secondary | ICD-10-CM | POA: Diagnosis not present

## 2015-05-17 DIAGNOSIS — N183 Chronic kidney disease, stage 3 (moderate): Secondary | ICD-10-CM | POA: Diagnosis not present

## 2015-05-17 DIAGNOSIS — E1122 Type 2 diabetes mellitus with diabetic chronic kidney disease: Secondary | ICD-10-CM | POA: Diagnosis not present

## 2015-06-01 DIAGNOSIS — G4733 Obstructive sleep apnea (adult) (pediatric): Secondary | ICD-10-CM | POA: Diagnosis not present

## 2015-06-01 DIAGNOSIS — E6609 Other obesity due to excess calories: Secondary | ICD-10-CM | POA: Diagnosis not present

## 2015-06-01 DIAGNOSIS — F17210 Nicotine dependence, cigarettes, uncomplicated: Secondary | ICD-10-CM | POA: Diagnosis not present

## 2015-06-01 DIAGNOSIS — J309 Allergic rhinitis, unspecified: Secondary | ICD-10-CM | POA: Diagnosis not present

## 2015-08-09 DIAGNOSIS — Z79899 Other long term (current) drug therapy: Secondary | ICD-10-CM | POA: Diagnosis not present

## 2015-08-09 DIAGNOSIS — L720 Epidermal cyst: Secondary | ICD-10-CM | POA: Diagnosis not present

## 2015-08-09 DIAGNOSIS — L905 Scar conditions and fibrosis of skin: Secondary | ICD-10-CM | POA: Diagnosis not present

## 2015-08-09 DIAGNOSIS — L819 Disorder of pigmentation, unspecified: Secondary | ICD-10-CM | POA: Diagnosis not present

## 2015-08-09 DIAGNOSIS — L732 Hidradenitis suppurativa: Secondary | ICD-10-CM | POA: Diagnosis not present

## 2015-09-13 DIAGNOSIS — E1129 Type 2 diabetes mellitus with other diabetic kidney complication: Secondary | ICD-10-CM | POA: Diagnosis not present

## 2015-09-13 DIAGNOSIS — N2581 Secondary hyperparathyroidism of renal origin: Secondary | ICD-10-CM | POA: Diagnosis not present

## 2015-09-13 DIAGNOSIS — R809 Proteinuria, unspecified: Secondary | ICD-10-CM | POA: Diagnosis not present

## 2015-09-13 DIAGNOSIS — I10 Essential (primary) hypertension: Secondary | ICD-10-CM | POA: Diagnosis not present

## 2015-09-13 DIAGNOSIS — E559 Vitamin D deficiency, unspecified: Secondary | ICD-10-CM | POA: Diagnosis not present

## 2015-10-04 DIAGNOSIS — E1122 Type 2 diabetes mellitus with diabetic chronic kidney disease: Secondary | ICD-10-CM | POA: Diagnosis not present

## 2015-10-04 DIAGNOSIS — R809 Proteinuria, unspecified: Secondary | ICD-10-CM | POA: Diagnosis not present

## 2015-10-04 DIAGNOSIS — N2581 Secondary hyperparathyroidism of renal origin: Secondary | ICD-10-CM | POA: Diagnosis not present

## 2015-10-04 DIAGNOSIS — N183 Chronic kidney disease, stage 3 (moderate): Secondary | ICD-10-CM | POA: Diagnosis not present

## 2015-10-04 DIAGNOSIS — I129 Hypertensive chronic kidney disease with stage 1 through stage 4 chronic kidney disease, or unspecified chronic kidney disease: Secondary | ICD-10-CM | POA: Diagnosis not present

## 2015-10-04 DIAGNOSIS — E559 Vitamin D deficiency, unspecified: Secondary | ICD-10-CM | POA: Diagnosis not present

## 2015-10-12 DIAGNOSIS — I252 Old myocardial infarction: Secondary | ICD-10-CM | POA: Diagnosis not present

## 2015-10-12 DIAGNOSIS — G4733 Obstructive sleep apnea (adult) (pediatric): Secondary | ICD-10-CM | POA: Diagnosis not present

## 2015-10-12 DIAGNOSIS — E1122 Type 2 diabetes mellitus with diabetic chronic kidney disease: Secondary | ICD-10-CM | POA: Diagnosis not present

## 2015-10-21 DIAGNOSIS — E113213 Type 2 diabetes mellitus with mild nonproliferative diabetic retinopathy with macular edema, bilateral: Secondary | ICD-10-CM | POA: Diagnosis not present

## 2015-10-27 DIAGNOSIS — I2510 Atherosclerotic heart disease of native coronary artery without angina pectoris: Secondary | ICD-10-CM | POA: Diagnosis not present

## 2015-10-27 DIAGNOSIS — I429 Cardiomyopathy, unspecified: Secondary | ICD-10-CM | POA: Diagnosis not present

## 2015-10-27 DIAGNOSIS — N183 Chronic kidney disease, stage 3 (moderate): Secondary | ICD-10-CM | POA: Diagnosis not present

## 2015-10-27 DIAGNOSIS — R0602 Shortness of breath: Secondary | ICD-10-CM | POA: Diagnosis not present

## 2015-10-27 DIAGNOSIS — R600 Localized edema: Secondary | ICD-10-CM | POA: Diagnosis not present

## 2015-10-27 DIAGNOSIS — I5042 Chronic combined systolic (congestive) and diastolic (congestive) heart failure: Secondary | ICD-10-CM | POA: Diagnosis not present

## 2015-10-27 DIAGNOSIS — R319 Hematuria, unspecified: Secondary | ICD-10-CM | POA: Diagnosis not present

## 2015-10-27 DIAGNOSIS — R809 Proteinuria, unspecified: Secondary | ICD-10-CM | POA: Diagnosis not present

## 2015-10-27 DIAGNOSIS — I272 Other secondary pulmonary hypertension: Secondary | ICD-10-CM | POA: Diagnosis not present

## 2015-10-27 DIAGNOSIS — R011 Cardiac murmur, unspecified: Secondary | ICD-10-CM | POA: Diagnosis not present

## 2015-10-27 DIAGNOSIS — I208 Other forms of angina pectoris: Secondary | ICD-10-CM | POA: Diagnosis not present

## 2015-11-10 DIAGNOSIS — E113313 Type 2 diabetes mellitus with moderate nonproliferative diabetic retinopathy with macular edema, bilateral: Secondary | ICD-10-CM | POA: Diagnosis not present

## 2015-11-30 DIAGNOSIS — J300 Vasomotor rhinitis: Secondary | ICD-10-CM | POA: Diagnosis not present

## 2015-11-30 DIAGNOSIS — F17210 Nicotine dependence, cigarettes, uncomplicated: Secondary | ICD-10-CM | POA: Diagnosis not present

## 2015-11-30 DIAGNOSIS — G4733 Obstructive sleep apnea (adult) (pediatric): Secondary | ICD-10-CM | POA: Diagnosis not present

## 2015-11-30 DIAGNOSIS — E6609 Other obesity due to excess calories: Secondary | ICD-10-CM | POA: Diagnosis not present

## 2015-12-08 DIAGNOSIS — H25812 Combined forms of age-related cataract, left eye: Secondary | ICD-10-CM | POA: Diagnosis not present

## 2015-12-08 DIAGNOSIS — E113313 Type 2 diabetes mellitus with moderate nonproliferative diabetic retinopathy with macular edema, bilateral: Secondary | ICD-10-CM | POA: Diagnosis not present

## 2015-12-13 ENCOUNTER — Other Ambulatory Visit: Payer: Self-pay | Admitting: Internal Medicine

## 2015-12-13 DIAGNOSIS — H540X55 Blindness right eye category 5, blindness left eye category 5: Principal | ICD-10-CM

## 2015-12-13 DIAGNOSIS — E113313 Type 2 diabetes mellitus with moderate nonproliferative diabetic retinopathy with macular edema, bilateral: Secondary | ICD-10-CM

## 2015-12-13 DIAGNOSIS — I6523 Occlusion and stenosis of bilateral carotid arteries: Secondary | ICD-10-CM

## 2015-12-16 ENCOUNTER — Ambulatory Visit
Admission: RE | Admit: 2015-12-16 | Discharge: 2015-12-16 | Disposition: A | Discharge status: Home / Self Care | Payer: Medicare Other | Source: Ambulatory Visit | Attending: Internal Medicine | Admitting: Internal Medicine

## 2015-12-16 DIAGNOSIS — H540X55 Blindness right eye category 5, blindness left eye category 5: Secondary | ICD-10-CM

## 2015-12-16 DIAGNOSIS — E113313 Type 2 diabetes mellitus with moderate nonproliferative diabetic retinopathy with macular edema, bilateral: Secondary | ICD-10-CM

## 2015-12-16 DIAGNOSIS — I6523 Occlusion and stenosis of bilateral carotid arteries: Secondary | ICD-10-CM | POA: Insufficient documentation

## 2016-01-06 DIAGNOSIS — H2512 Age-related nuclear cataract, left eye: Secondary | ICD-10-CM | POA: Diagnosis not present

## 2016-01-06 DIAGNOSIS — H25812 Combined forms of age-related cataract, left eye: Secondary | ICD-10-CM | POA: Diagnosis not present

## 2016-01-11 ENCOUNTER — Other Ambulatory Visit (INDEPENDENT_AMBULATORY_CARE_PROVIDER_SITE_OTHER): Payer: Self-pay | Admitting: Vascular Surgery

## 2016-01-11 DIAGNOSIS — I714 Abdominal aortic aneurysm, without rupture, unspecified: Secondary | ICD-10-CM

## 2016-01-17 ENCOUNTER — Other Ambulatory Visit (INDEPENDENT_AMBULATORY_CARE_PROVIDER_SITE_OTHER): Payer: Self-pay | Admitting: Vascular Surgery

## 2016-01-17 DIAGNOSIS — Z79899 Other long term (current) drug therapy: Secondary | ICD-10-CM | POA: Diagnosis not present

## 2016-01-17 DIAGNOSIS — I714 Abdominal aortic aneurysm, without rupture, unspecified: Secondary | ICD-10-CM

## 2016-01-17 DIAGNOSIS — L0291 Cutaneous abscess, unspecified: Secondary | ICD-10-CM | POA: Diagnosis not present

## 2016-01-17 DIAGNOSIS — L732 Hidradenitis suppurativa: Secondary | ICD-10-CM | POA: Diagnosis not present

## 2016-01-18 ENCOUNTER — Ambulatory Visit (INDEPENDENT_AMBULATORY_CARE_PROVIDER_SITE_OTHER): Payer: Medicare Other | Admitting: Vascular Surgery

## 2016-01-18 ENCOUNTER — Other Ambulatory Visit (INDEPENDENT_AMBULATORY_CARE_PROVIDER_SITE_OTHER): Payer: Medicare Other

## 2016-01-24 ENCOUNTER — Other Ambulatory Visit (INDEPENDENT_AMBULATORY_CARE_PROVIDER_SITE_OTHER): Payer: Self-pay | Admitting: Vascular Surgery

## 2016-01-25 ENCOUNTER — Other Ambulatory Visit (INDEPENDENT_AMBULATORY_CARE_PROVIDER_SITE_OTHER): Payer: Self-pay | Admitting: Vascular Surgery

## 2016-01-25 DIAGNOSIS — I714 Abdominal aortic aneurysm, without rupture, unspecified: Secondary | ICD-10-CM

## 2016-02-01 ENCOUNTER — Ambulatory Visit
Admission: RE | Admit: 2016-02-01 | Discharge: 2016-02-01 | Disposition: A | Discharge status: Home / Self Care | Payer: Medicare Other | Source: Ambulatory Visit | Attending: Vascular Surgery | Admitting: Vascular Surgery

## 2016-02-01 DIAGNOSIS — K769 Liver disease, unspecified: Secondary | ICD-10-CM | POA: Insufficient documentation

## 2016-02-01 DIAGNOSIS — I714 Abdominal aortic aneurysm, without rupture, unspecified: Secondary | ICD-10-CM

## 2016-02-23 DIAGNOSIS — E559 Vitamin D deficiency, unspecified: Secondary | ICD-10-CM | POA: Diagnosis not present

## 2016-02-23 DIAGNOSIS — E1122 Type 2 diabetes mellitus with diabetic chronic kidney disease: Secondary | ICD-10-CM | POA: Diagnosis not present

## 2016-02-23 DIAGNOSIS — I129 Hypertensive chronic kidney disease with stage 1 through stage 4 chronic kidney disease, or unspecified chronic kidney disease: Secondary | ICD-10-CM | POA: Diagnosis not present

## 2016-02-23 DIAGNOSIS — N2581 Secondary hyperparathyroidism of renal origin: Secondary | ICD-10-CM | POA: Diagnosis not present

## 2016-02-23 DIAGNOSIS — N183 Chronic kidney disease, stage 3 (moderate): Secondary | ICD-10-CM | POA: Diagnosis not present

## 2016-02-23 DIAGNOSIS — R809 Proteinuria, unspecified: Secondary | ICD-10-CM | POA: Diagnosis not present

## 2016-02-29 ENCOUNTER — Encounter (INDEPENDENT_AMBULATORY_CARE_PROVIDER_SITE_OTHER): Payer: Self-pay | Admitting: Vascular Surgery

## 2016-02-29 ENCOUNTER — Ambulatory Visit (INDEPENDENT_AMBULATORY_CARE_PROVIDER_SITE_OTHER): Payer: Medicare Other | Admitting: Vascular Surgery

## 2016-02-29 VITALS — BP 108/66 | HR 72 | Resp 16 | Ht 68.0 in | Wt 227.0 lb

## 2016-02-29 DIAGNOSIS — I714 Abdominal aortic aneurysm, without rupture, unspecified: Secondary | ICD-10-CM

## 2016-02-29 DIAGNOSIS — E118 Type 2 diabetes mellitus with unspecified complications: Secondary | ICD-10-CM

## 2016-02-29 DIAGNOSIS — Z794 Long term (current) use of insulin: Secondary | ICD-10-CM | POA: Diagnosis not present

## 2016-02-29 DIAGNOSIS — E119 Type 2 diabetes mellitus without complications: Secondary | ICD-10-CM | POA: Insufficient documentation

## 2016-02-29 DIAGNOSIS — I10 Essential (primary) hypertension: Secondary | ICD-10-CM | POA: Insufficient documentation

## 2016-02-29 NOTE — Assessment & Plan Note (Signed)
The patient has a long-standing abdominal aortic aneurysm which we have been following. His recent ultrasound the hospital would suggest slight progression from his previous study but still a less than 4 cm aneurysm. Plan to recheck this in one year with aortic duplex. Contact our office with problems in the interim. Smoking cessation and blood pressure control recommended.

## 2016-02-29 NOTE — Assessment & Plan Note (Signed)
blood glucose control important in reducing the progression of atherosclerotic disease. Also, involved in wound healing. On appropriate medications.  

## 2016-02-29 NOTE — Progress Notes (Signed)
MRN : [id number]  Jeremy Gutierrez is a 66 y.o. ([birth year]) male who presents with chief complaint of  Chief Complaint  Patient presents with  . Follow-up  .  History of Present Illness: Patient returns today in follow up of AAA. He has no aneurysm related symptoms. Specifically, the patient denies new back or abdominal pain, or signs of peripheral embolization. His aortic duplex performed recently at the hospital suggest slight progression of his aneurysm now measuring 3.5 cm in maximal diameter. This had previously measured 3.3-3.4 cm.  Current Outpatient Prescriptions  Medication Sig Dispense Refill  . aspirin 81 MG tablet Take 81 mg by mouth daily.    . carvedilol (COREG) 3.125 MG tablet Take 3.125 mg by mouth 2 (two) times daily with a meal.    . DILT-XR 120 MG 24 hr capsule     . furosemide (LASIX) 40 MG tablet Take 80 mg by mouth daily.    . insulin lispro protamine-lispro (HUMALOG 75/25) (75-25) 100 UNIT/ML SUSP Inject 25-30 Units into the skin 2 (two) times daily. 30 morning and 25 at bedtime    . losartan (COZAAR) 50 MG tablet Take 50 mg by mouth daily.    . simvastatin (ZOCOR) 20 MG tablet Take 20 mg by mouth every evening.    . torsemide (DEMADEX) 100 MG tablet     . cephALEXin (KEFLEX) 500 MG capsule Take 500 mg by mouth 2 (two) times daily.    . potassium chloride (K-DUR,KLOR-CON) 10 MEQ tablet Take 10 mEq by mouth daily.     No current facility-administered medications for this visit.     Past Medical History:  Diagnosis Date  . Aneurysm (HCC)   . Arthritis   . CHF (congestive heart failure) (HCC)   . Diabetes mellitus without complication (HCC)   . Heart murmur    as a child- 'nothing to worry about'  . Sleep apnea     Past Surgical History:  Procedure Laterality Date  . BUNIONECTOMY Right   . CARDIAC CATHETERIZATION  2010  . CATARACT EXTRACTION W/PHACO Right 06/05/2012   Procedure: CATARACT EXTRACTION PHACO AND INTRAOCULAR LENS PLACEMENT (IOC);  Surgeon:  Shade Flood, MD;  Location: [facility] OR;  Service: Ophthalmology;  Laterality: Right;  . Colonscopy  2012    Social History Social History  Substance Use Topics  . Smoking status: Current Every Day Smoker    Packs/day: 0.50    Years: 40.00    Types: Cigarettes    Last attempt to quit: 06/02/2012  . Smokeless tobacco: Never Used     Comment: smoked for many years.  . Alcohol use Yes     Comment: social    Family History No bleeding or clotting disorders  Allergies  Allergen Reactions  . Sulfa Antibiotics Hives    Patient states he is not allergic to this medication     REVIEW OF SYSTEMS (Negative unless checked)  Constitutional: [] Weight loss  [] Fever  [] Chills Cardiac: [] Chest pain   [] Chest pressure   [] Palpitations   [] Shortness of breath when laying flat   [] Shortness of breath at rest   [] Shortness of breath with exertion. Vascular:  [] Pain in legs with walking   [] Pain in legs at rest   [] Pain in legs when laying flat   [] Claudication   [] Pain in feet when walking  [] Pain in feet at rest  [] Pain in feet when laying flat   [] History of DVT   [] Phlebitis   [] Swelling in legs   []   Varicose veins   [] Non-healing ulcers Pulmonary:   [] Uses home oxygen   [] Productive cough   [] Hemoptysis   [] Wheeze  [] COPD   [] Asthma Neurologic:  [] Dizziness  [] Blackouts   [] Seizures   [] History of stroke   [] History of TIA  [] Aphasia   [] Temporary blindness   [] Dysphagia   [] Weakness or numbness in arms   [] Weakness or numbness in legs Musculoskeletal:  [] Arthritis   [] Joint swelling   [] Joint pain   [] Low back pain Hematologic:  [] Easy bruising  [] Easy bleeding   [] Hypercoagulable state   [] Anemic   Gastrointestinal:  [] Blood in stool   [] Vomiting blood  [] Gastroesophageal reflux/heartburn   [] Abdominal pain Genitourinary:  [] Chronic kidney disease   [] Difficult urination  [] Frequent urination  [] Burning with urination   [] Hematuria Skin:  [] Rashes   [] Ulcers   [] Wounds Psychological:  [] History of  anxiety   []  History of major depression.  Physical Examination  BP 108/66   Pulse 72   Resp 16   Ht 5\' 8"  (1.727 m)   Wt 227 lb (103 kg)   BMI 34.52 kg/m  Gen:  WD/WN, NAD Head: [place]/AT, No temporalis wasting. Ear/Nose/Throat: Hearing grossly intact, nares w/o erythema or drainage, trachea midline Eyes: Conjunctiva clear. Sclera non-icteric Neck: Supple.  No JVD.  Pulmonary:  Good air movement, no use of accessory muscles.  Cardiac: RRR, normal S1, S2 Vascular:  Vessel Right Left  Radial Palpable Palpable                                   Gastrointestinal: soft, non-tender/non-distended.  Musculoskeletal: M/S 5/5 throughout.  No deformity or atrophy.  Neurologic: Sensation grossly intact in extremities.  Symmetrical.  Speech is fluent.  Psychiatric: Judgment intact, Mood & affect appropriate for pt's clinical situation. Dermatologic: No rashes or ulcers noted.  No cellulitis or open wounds. Lymph : No Cervical, Axillary, or Inguinal lymphadenopathy.      Labs No results found for this or any previous visit (from the past 2160 hour(s)).  Radiology [place] Retroperitoneal Comp  Result Date: 02/01/2016 CLINICAL DATA:  History of abdominal aortic aneurysm and a abdominal pain EXAM: ULTRASOUND RETROPERITONEAL COMPLETE TECHNIQUE: Ultrasound examination of the abdominal aorta was performed to evaluate for abdominal aortic aneurysm. The common iliac arteries, IVC, and kidneys were also evaluated. COMPARISON:  None. FINDINGS: Abdominal Aorta Mild aneurysmal dilatation to 3.5 cm. Maximum AP Diameter:  3.4 cm Maximum TRV Diameter: 3.5 cm Right Common Iliac Artery 9 mm Left Common Iliac Artery 12 mm IVC No abnormality visualized. Right Kidney Length: 11.5 cm Echogenicity within normal limits. No mass or hydronephrosis visualized. Left Kidney Length: 10.5 cm Echogenicity within normal limits. No mass or hydronephrosis visualized. Incidental note is made of a 1.4 cm hyperechoic lesion in  the left lobe of the liver. Statistically this likely represents a hemangioma. The need for further workup can be determined clinically. IMPRESSION: Mild aneurysmal dilatation of the abdominal aorta. Hyperechoic lesion within the left lobe of the liver likely representing a hemangioma. Electronically Signed   By: Alcide Clever M.D.   On: 02/01/2016 10:42      Assessment/Plan  Essential hypertension, benign blood pressure control important in reducing the progression of atherosclerotic disease and aneurysmal disease. On appropriate oral medications.   AAA (abdominal aortic aneurysm) without rupture (HCC) The patient has a long-standing abdominal aortic aneurysm which we have been following. His recent ultrasound the hospital would  suggest slight progression from his previous study but still a less than 4 cm aneurysm. Plan to recheck this in one year with aortic duplex. Contact our office with problems in the interim. Smoking cessation and blood pressure control recommended.  Diabetes (HCC) blood glucose control important in reducing the progression of atherosclerotic disease. Also, involved in wound healing. On appropriate medications.     Festus BarrenJason Hance Caspers, MD  02/29/2016 4:14 PM    This note was created with Dragon medical transcription system.  Any errors from dictation are purely unintentional

## 2016-02-29 NOTE — Assessment & Plan Note (Signed)
blood pressure control important in reducing the progression of atherosclerotic disease and aneurysmal disease. On appropriate oral medications.  

## 2016-03-21 DIAGNOSIS — E1165 Type 2 diabetes mellitus with hyperglycemia: Secondary | ICD-10-CM | POA: Diagnosis not present

## 2016-03-21 DIAGNOSIS — I110 Hypertensive heart disease with heart failure: Secondary | ICD-10-CM | POA: Diagnosis not present

## 2016-03-21 DIAGNOSIS — I252 Old myocardial infarction: Secondary | ICD-10-CM | POA: Diagnosis not present

## 2016-04-01 DIAGNOSIS — R531 Weakness: Secondary | ICD-10-CM | POA: Diagnosis not present

## 2016-04-01 DIAGNOSIS — I959 Hypotension, unspecified: Secondary | ICD-10-CM | POA: Diagnosis not present

## 2016-04-02 ENCOUNTER — Encounter: Payer: Self-pay | Admitting: Emergency Medicine

## 2016-04-02 ENCOUNTER — Emergency Department: Payer: Medicare Other

## 2016-04-02 ENCOUNTER — Inpatient Hospital Stay
Admit: 2016-04-02 | Discharge: 2016-04-02 | Disposition: A | Discharge status: Home / Self Care | Payer: Medicare Other | Attending: Internal Medicine | Admitting: Internal Medicine

## 2016-04-02 ENCOUNTER — Inpatient Hospital Stay
Admission: EM | Admit: 2016-04-02 | Discharge: 2016-04-04 | DRG: 638 | Disposition: A | Discharge status: Home / Self Care | Payer: Medicare Other | Attending: Internal Medicine | Admitting: Internal Medicine

## 2016-04-02 DIAGNOSIS — Z8249 Family history of ischemic heart disease and other diseases of the circulatory system: Secondary | ICD-10-CM | POA: Diagnosis not present

## 2016-04-02 DIAGNOSIS — I130 Hypertensive heart and chronic kidney disease with heart failure and stage 1 through stage 4 chronic kidney disease, or unspecified chronic kidney disease: Secondary | ICD-10-CM | POA: Diagnosis present

## 2016-04-02 DIAGNOSIS — R809 Proteinuria, unspecified: Secondary | ICD-10-CM | POA: Diagnosis not present

## 2016-04-02 DIAGNOSIS — M1990 Unspecified osteoarthritis, unspecified site: Secondary | ICD-10-CM | POA: Diagnosis present

## 2016-04-02 DIAGNOSIS — E785 Hyperlipidemia, unspecified: Secondary | ICD-10-CM | POA: Diagnosis present

## 2016-04-02 DIAGNOSIS — Z7982 Long term (current) use of aspirin: Secondary | ICD-10-CM | POA: Diagnosis not present

## 2016-04-02 DIAGNOSIS — E1165 Type 2 diabetes mellitus with hyperglycemia: Principal | ICD-10-CM | POA: Diagnosis present

## 2016-04-02 DIAGNOSIS — R410 Disorientation, unspecified: Secondary | ICD-10-CM

## 2016-04-02 DIAGNOSIS — I714 Abdominal aortic aneurysm, without rupture: Secondary | ICD-10-CM | POA: Diagnosis present

## 2016-04-02 DIAGNOSIS — E1122 Type 2 diabetes mellitus with diabetic chronic kidney disease: Secondary | ICD-10-CM | POA: Diagnosis present

## 2016-04-02 DIAGNOSIS — T502X5A Adverse effect of carbonic-anhydrase inhibitors, benzothiadiazides and other diuretics, initial encounter: Secondary | ICD-10-CM | POA: Diagnosis present

## 2016-04-02 DIAGNOSIS — R05 Cough: Secondary | ICD-10-CM | POA: Diagnosis not present

## 2016-04-02 DIAGNOSIS — E1151 Type 2 diabetes mellitus with diabetic peripheral angiopathy without gangrene: Secondary | ICD-10-CM | POA: Diagnosis present

## 2016-04-02 DIAGNOSIS — I5023 Acute on chronic systolic (congestive) heart failure: Secondary | ICD-10-CM | POA: Diagnosis not present

## 2016-04-02 DIAGNOSIS — M6281 Muscle weakness (generalized): Secondary | ICD-10-CM

## 2016-04-02 DIAGNOSIS — R531 Weakness: Secondary | ICD-10-CM | POA: Diagnosis not present

## 2016-04-02 DIAGNOSIS — G4730 Sleep apnea, unspecified: Secondary | ICD-10-CM | POA: Diagnosis present

## 2016-04-02 DIAGNOSIS — F17210 Nicotine dependence, cigarettes, uncomplicated: Secondary | ICD-10-CM | POA: Diagnosis present

## 2016-04-02 DIAGNOSIS — N179 Acute kidney failure, unspecified: Secondary | ICD-10-CM | POA: Diagnosis not present

## 2016-04-02 DIAGNOSIS — Z809 Family history of malignant neoplasm, unspecified: Secondary | ICD-10-CM | POA: Diagnosis not present

## 2016-04-02 DIAGNOSIS — E119 Type 2 diabetes mellitus without complications: Secondary | ICD-10-CM | POA: Diagnosis not present

## 2016-04-02 DIAGNOSIS — I5022 Chronic systolic (congestive) heart failure: Secondary | ICD-10-CM | POA: Diagnosis present

## 2016-04-02 DIAGNOSIS — N184 Chronic kidney disease, stage 4 (severe): Secondary | ICD-10-CM | POA: Diagnosis present

## 2016-04-02 DIAGNOSIS — R17 Unspecified jaundice: Secondary | ICD-10-CM

## 2016-04-02 DIAGNOSIS — N183 Chronic kidney disease, stage 3 (moderate): Secondary | ICD-10-CM | POA: Diagnosis not present

## 2016-04-02 DIAGNOSIS — R739 Hyperglycemia, unspecified: Secondary | ICD-10-CM | POA: Diagnosis present

## 2016-04-02 DIAGNOSIS — K7689 Other specified diseases of liver: Secondary | ICD-10-CM | POA: Diagnosis not present

## 2016-04-02 DIAGNOSIS — I081 Rheumatic disorders of both mitral and tricuspid valves: Secondary | ICD-10-CM | POA: Diagnosis present

## 2016-04-02 DIAGNOSIS — Z961 Presence of intraocular lens: Secondary | ICD-10-CM | POA: Diagnosis present

## 2016-04-02 DIAGNOSIS — Z882 Allergy status to sulfonamides status: Secondary | ICD-10-CM | POA: Diagnosis not present

## 2016-04-02 DIAGNOSIS — Z716 Tobacco abuse counseling: Secondary | ICD-10-CM | POA: Diagnosis not present

## 2016-04-02 DIAGNOSIS — G4733 Obstructive sleep apnea (adult) (pediatric): Secondary | ICD-10-CM | POA: Diagnosis not present

## 2016-04-02 DIAGNOSIS — Z9841 Cataract extraction status, right eye: Secondary | ICD-10-CM

## 2016-04-02 LAB — URINALYSIS, COMPLETE (UACMP) WITH MICROSCOPIC
BACTERIA UA: NONE SEEN
BILIRUBIN URINE: NEGATIVE
Glucose, UA: >=500 mg/dL — AB
HGB URINE DIPSTICK: NEGATIVE
KETONES UR: NEGATIVE mg/dL
LEUKOCYTES UA: NEGATIVE
NITRITE: NEGATIVE
PROTEIN: 100 mg/dL — AB
SPECIFIC GRAVITY, URINE: 1.015 (ref 1.005–1.030)
pH: 5 (ref 5.0–8.0)

## 2016-04-02 LAB — COMPREHENSIVE METABOLIC PANEL
ALBUMIN: 3.9 g/dL (ref 3.5–5.0)
ALT: 46 U/L (ref 17–63)
AST: 39 U/L (ref 15–41)
Alkaline Phosphatase: 105 U/L (ref 38–126)
Anion gap: 13 (ref 5–15)
BUN: 96 mg/dL — AB (ref 6–20)
CHLORIDE: 81 mmol/L — AB (ref 101–111)
CO2: 31 mmol/L (ref 22–32)
Calcium: 9.4 mg/dL (ref 8.9–10.3)
Creatinine, Ser: 3 mg/dL — ABNORMAL HIGH (ref 0.61–1.24)
GFR calc Af Amer: 23 mL/min — ABNORMAL LOW (ref >60)
GFR calc non Af Amer: 20 mL/min — ABNORMAL LOW (ref >60)
Glucose, Bld: 706 mg/dL (ref 65–99)
POTASSIUM: 4.9 mmol/L (ref 3.5–5.1)
Sodium: 125 mmol/L — ABNORMAL LOW (ref 135–145)
Total Bilirubin: 1.9 mg/dL — ABNORMAL HIGH (ref 0.3–1.2)
Total Protein: 8.5 g/dL — ABNORMAL HIGH (ref 6.5–8.1)

## 2016-04-02 LAB — GLUCOSE, CAPILLARY
Glucose-Capillary: 138 mg/dL — ABNORMAL HIGH (ref 65–99)
Glucose-Capillary: 167 mg/dL — ABNORMAL HIGH (ref 65–99)
Glucose-Capillary: 361 mg/dL — ABNORMAL HIGH (ref 65–99)
Glucose-Capillary: 452 mg/dL — ABNORMAL HIGH (ref 65–99)
Glucose-Capillary: 479 mg/dL — ABNORMAL HIGH (ref 65–99)
Glucose-Capillary: 553 mg/dL (ref 65–99)
Glucose-Capillary: 74 mg/dL (ref 65–99)
Glucose-Capillary: 88 mg/dL (ref 65–99)
Glucose-Capillary: >600 mg/dL (ref 65–99)
Glucose-Capillary: >600 mg/dL (ref 65–99)
Glucose-Capillary: >600 mg/dL (ref 65–99)

## 2016-04-02 LAB — BASIC METABOLIC PANEL
Anion gap: 14 (ref 5–15)
BUN: 103 mg/dL — ABNORMAL HIGH (ref 6–20)
CO2: 30 mmol/L (ref 22–32)
Calcium: 9.7 mg/dL (ref 8.9–10.3)
Chloride: 83 mmol/L — ABNORMAL LOW (ref 101–111)
Creatinine, Ser: 3.07 mg/dL — ABNORMAL HIGH (ref 0.61–1.24)
GFR calc Af Amer: 23 mL/min — ABNORMAL LOW (ref >60)
GFR calc non Af Amer: 20 mL/min — ABNORMAL LOW (ref >60)
Glucose, Bld: 573 mg/dL (ref 65–99)
Potassium: 4.4 mmol/L (ref 3.5–5.1)
Sodium: 127 mmol/L — ABNORMAL LOW (ref 135–145)

## 2016-04-02 LAB — DIFFERENTIAL
BASOS ABS: 0.1 10*3/uL (ref 0–0.1)
BASOS PCT: 0 %
EOS ABS: 0.1 10*3/uL (ref 0–0.7)
Eosinophils Relative: 1 %
Lymphocytes Relative: 25 %
Lymphs Abs: 3.2 10*3/uL (ref 1.0–3.6)
MONOS PCT: 8 %
Monocytes Absolute: 1 10*3/uL (ref 0.2–1.0)
Neutro Abs: 8.4 10*3/uL — ABNORMAL HIGH (ref 1.4–6.5)
Neutrophils Relative %: 66 %

## 2016-04-02 LAB — CBC
HEMATOCRIT: 60.9 % — AB (ref 40.0–52.0)
Hemoglobin: 19.9 g/dL — ABNORMAL HIGH (ref 13.0–18.0)
MCH: 31.1 pg (ref 26.0–34.0)
MCHC: 32.6 g/dL (ref 32.0–36.0)
MCV: 95.3 fL (ref 80.0–100.0)
PLATELETS: 148 10*3/uL — AB (ref 150–440)
RBC: 6.4 MIL/uL — AB (ref 4.40–5.90)
RDW: 13.8 % (ref 11.5–14.5)
WBC: 13.3 10*3/uL — AB (ref 3.8–10.6)

## 2016-04-02 LAB — TSH: TSH: 3.434 u[IU]/mL (ref 0.350–4.500)

## 2016-04-02 LAB — MAGNESIUM: Magnesium: 2.4 mg/dL (ref 1.7–2.4)

## 2016-04-02 LAB — INFLUENZA PANEL BY PCR (TYPE A & B)
Influenza A By PCR: NEGATIVE
Influenza B By PCR: NEGATIVE

## 2016-04-02 MED ORDER — ONDANSETRON HCL 4 MG PO TABS: 4.0000 mg | ORAL_TABLET | Freq: Four times a day (QID) | ORAL | Status: DC | PRN

## 2016-04-02 MED ORDER — INSULIN ASPART 100 UNIT/ML ~~LOC~~ SOLN: 8.0000 [IU] | Freq: Three times a day (TID) | SUBCUTANEOUS | Status: DC

## 2016-04-02 MED ORDER — DOCUSATE SODIUM 100 MG PO CAPS
100.0000 mg | ORAL_CAPSULE | Freq: Two times a day (BID) | ORAL | Status: DC
  Administered 2016-04-02 – 2016-04-04 (×4): 100 mg via ORAL
  Filled 2016-04-02 (×5): qty 1

## 2016-04-02 MED ORDER — INSULIN REGULAR HUMAN 100 UNIT/ML IJ SOLN
10.0000 [IU] | Freq: Once | INTRAMUSCULAR | Status: AC
  Administered 2016-04-02: 10 [IU] via INTRAVENOUS
  Filled 2016-04-02: qty 0.1

## 2016-04-02 MED ORDER — SIMVASTATIN 20 MG PO TABS
20.0000 mg | ORAL_TABLET | Freq: Every evening | ORAL | Status: DC
  Administered 2016-04-02: 20 mg via ORAL
  Filled 2016-04-02: qty 1

## 2016-04-02 MED ORDER — ACETAMINOPHEN 650 MG RE SUPP: 650.0000 mg | Freq: Four times a day (QID) | RECTAL | Status: DC | PRN

## 2016-04-02 MED ORDER — POTASSIUM CHLORIDE CRYS ER 10 MEQ PO TBCR
10.0000 meq | EXTENDED_RELEASE_TABLET | Freq: Every day | ORAL | Status: DC
  Administered 2016-04-02 – 2016-04-03 (×2): 10 meq via ORAL
  Filled 2016-04-02 (×2): qty 1

## 2016-04-02 MED ORDER — FUROSEMIDE 40 MG PO TABS
80.0000 mg | ORAL_TABLET | Freq: Every day | ORAL | Status: DC
  Administered 2016-04-02: 80 mg via ORAL
  Filled 2016-04-02: qty 2

## 2016-04-02 MED ORDER — INSULIN ASPART 100 UNIT/ML ~~LOC~~ SOLN
0.0000 [IU] | Freq: Three times a day (TID) | SUBCUTANEOUS | Status: DC
  Administered 2016-04-02: 20 [IU] via SUBCUTANEOUS
  Administered 2016-04-02: 28 [IU] via SUBCUTANEOUS
  Administered 2016-04-03: 4 [IU] via SUBCUTANEOUS
  Administered 2016-04-03: 15 [IU] via SUBCUTANEOUS
  Administered 2016-04-03: 11 [IU] via SUBCUTANEOUS
  Administered 2016-04-04: 7 [IU] via SUBCUTANEOUS
  Administered 2016-04-04: 4 [IU] via SUBCUTANEOUS
  Administered 2016-04-04: 11 [IU] via SUBCUTANEOUS
  Filled 2016-04-02: qty 20
  Filled 2016-04-02: qty 28
  Filled 2016-04-02: qty 4
  Filled 2016-04-02: qty 7
  Filled 2016-04-02: qty 4
  Filled 2016-04-02: qty 11
  Filled 2016-04-02: qty 15
  Filled 2016-04-02: qty 11

## 2016-04-02 MED ORDER — DILTIAZEM HCL ER COATED BEADS 120 MG PO CP24
120.0000 mg | ORAL_CAPSULE | Freq: Every day | ORAL | Status: DC
  Administered 2016-04-02 – 2016-04-04 (×3): 120 mg via ORAL
  Filled 2016-04-02 (×3): qty 1

## 2016-04-02 MED ORDER — ORAL CARE MOUTH RINSE
15.0000 mL | Freq: Two times a day (BID) | OROMUCOSAL | Status: DC
  Administered 2016-04-02 (×2): 15 mL via OROMUCOSAL

## 2016-04-02 MED ORDER — ASPIRIN 81 MG PO CHEW
81.0000 mg | CHEWABLE_TABLET | Freq: Every day | ORAL | Status: DC
  Administered 2016-04-02 – 2016-04-04 (×3): 81 mg via ORAL
  Filled 2016-04-02 (×3): qty 1

## 2016-04-02 MED ORDER — INSULIN DETEMIR 100 UNIT/ML ~~LOC~~ SOLN
40.0000 [IU] | Freq: Every day | SUBCUTANEOUS | Status: DC
  Administered 2016-04-02: 40 [IU] via SUBCUTANEOUS
  Filled 2016-04-02: qty 0.4

## 2016-04-02 MED ORDER — NICOTINE 21 MG/24HR TD PT24
21.0000 mg | MEDICATED_PATCH | Freq: Every day | TRANSDERMAL | Status: DC
  Filled 2016-04-02: qty 1

## 2016-04-02 MED ORDER — CARVEDILOL 3.125 MG PO TABS
3.1250 mg | ORAL_TABLET | Freq: Two times a day (BID) | ORAL | Status: DC
  Administered 2016-04-02 – 2016-04-04 (×4): 3.125 mg via ORAL
  Filled 2016-04-02 (×4): qty 1

## 2016-04-02 MED ORDER — HEPARIN SODIUM (PORCINE) 5000 UNIT/ML IJ SOLN
5000.0000 [IU] | Freq: Three times a day (TID) | INTRAMUSCULAR | Status: DC
  Administered 2016-04-02 – 2016-04-03 (×4): 5000 [IU] via SUBCUTANEOUS
  Filled 2016-04-02 (×4): qty 1

## 2016-04-02 MED ORDER — INSULIN ASPART 100 UNIT/ML ~~LOC~~ SOLN
8.0000 [IU] | Freq: Once | SUBCUTANEOUS | Status: AC
  Administered 2016-04-02: 8 [IU] via INTRAVENOUS
  Filled 2016-04-02: qty 8

## 2016-04-02 MED ORDER — SODIUM CHLORIDE 0.9 % IV SOLN
INTRAVENOUS | Status: DC
  Administered 2016-04-02 (×2): via INTRAVENOUS

## 2016-04-02 MED ORDER — ONDANSETRON HCL 4 MG/2ML IJ SOLN: 4.0000 mg | Freq: Four times a day (QID) | INTRAMUSCULAR | Status: DC | PRN

## 2016-04-02 MED ORDER — INSULIN ASPART 100 UNIT/ML ~~LOC~~ SOLN
0.0000 [IU] | Freq: Once | SUBCUTANEOUS | Status: AC
  Administered 2016-04-02: 5 [IU] via SUBCUTANEOUS
  Filled 2016-04-02: qty 5

## 2016-04-02 MED ORDER — LOSARTAN POTASSIUM 50 MG PO TABS: 50.0000 mg | ORAL_TABLET | Freq: Every day | ORAL | Status: DC

## 2016-04-02 MED ORDER — ACETAMINOPHEN 325 MG PO TABS: 650.0000 mg | ORAL_TABLET | Freq: Four times a day (QID) | ORAL | Status: DC | PRN

## 2016-04-02 NOTE — ED Notes (Signed)
While rounding on the pt, pt has removed all CM , b/p cuff and IV from the right hand , states he had to use the BR and just pulled everything off. States he would rather have the IV in the left hand. IV placed pt reconnected to CM. Encouraged pt to please use the call bell when he needs assistance..Marland Kitchen

## 2016-04-02 NOTE — Progress Notes (Signed)
Pt's CBG dropped from 361 at 1200 to 74 at 1700. Pt is due for 8 units of novolog for meal coverage. Will hold this dose. MD made aware. No new orders at this time.

## 2016-04-02 NOTE — ED Notes (Signed)
Reports called to French Anaracy, RN, RN "not comfortable with taking patient" and French Anaracy, Charity fundraiserN, is supposed to call back this RN after talking to house sup,  Charge and Dr Sheryle Hailiamond notified, orders recieved

## 2016-04-02 NOTE — Progress Notes (Signed)
*  PRELIMINARY RESULTS* Echocardiogram 2D Echocardiogram has been performed.  Garrel Ridgelikeshia S Tadeo Besecker 04/02/2016, 2:18 PM

## 2016-04-02 NOTE — ED Notes (Signed)
Dr Sheryle Hailiamond notified of "HI" CBG

## 2016-04-02 NOTE — Progress Notes (Addendum)
This is a 66 year old male admitted for hyperglycemia. The patient complains of generalized weakness. Blood sugar was high more than 600 in the ED. He was treated with insulin several times and start Levemir with NovoLog 8 units before meals. Blood sugar is better at 361.  Vital signs reviewed and stable. Physical examinations is unremarkable.  1. Hyperglycemia and DM  Continue sliding scale Levemir with NovoLog 8 units before meals.   2. Leukocytosis: Likely secondary to hyperglycemia induced demarcation. Differential of CBC is normal. The patient is afebrile without wounds and has a clear chest x-ray. Follow-up CBC.  3. Congestive heart failure: Chronic; systolic. Murmurs consistent with tricuspid and mitral stenosis are present.  hold Lasix due to renal failure. F/u echocardiogram. Continue Coreg.  4. Hypertension: Controlled; hold losartan until kidney function improves. Continue diltiazem  5. 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 IV fluid support and follow-up BMP. Nephrology consult. Avoid nephrotoxic agents, hold losartan.  6. Hyperlipidemia: Continue statin therapy 7. AAA: no abdominal pain or pulsatile abdominal masses. Stable at 3.4 cm last ultrasound.  Tobacco abuse. Smoking cessation was counseled for 4-5 minutes, nicotine patch.  Time spent: 43 minutes.

## 2016-04-02 NOTE — ED Notes (Signed)
Pt eating breakfast with a diet sprite per pt preference.

## 2016-04-02 NOTE — Progress Notes (Signed)
Pt transferred from ED to floor. Pt is alert and oriented x 4, daughter is at bedside, VSS: BP 114/66 (BP Location: Right Arm)   Pulse 80   Temp 97.7 F (36.5 C) (Oral)   Resp 16   Ht 5\' 8"  (1.727 m)   Wt 94.2 kg (207 lb 9.6 oz)   SpO2 96%   BMI 31.57 kg/m , and he has no complaints at this time.

## 2016-04-02 NOTE — ED Triage Notes (Signed)
Pt from home via EMS; per EMS pt c/o generalized weakness, pt's bp was 86/56 around 2340; EMS administered approximately 700 ml of NS, BP increased to 118/70, pt's cbg was 289; [facility] ED, pt is alert, and speaking in full sentences, pt's speech is incoherent at times, pt is oriented to person, time and space; pt states he uses a c-pap at home at night; pt states he has a cough and congestion for the past month, cough is unproductive, pt denies feeling any dizziness, lightheadedness, nausea, vomiting, or diarrhea; pt has history of CHF, and diabetes

## 2016-04-02 NOTE — H&P (Addendum)
Jeremy Gutierrez is an 66 y.o. male.   Chief Complaint: Weakness HPI: The patient with past medical history of diabetes and congestive heart failure presents emergency department complaining of weakness. The patient awoke yesterday morning feeling somewhat tremulous. He told his daughter that he felt well and had no complaints. Throughout the day she states that the patient did not seem to have the same energy level as usual but again did not complain of fever, nausea, vomiting, chest pain or shortness of breath. By the evening the patient was again tremulous but decided to have a little to eat and drink because thought his blood sugar was low. he went to bed because he felt fatigued. Later, the patient's daughter heard him fall and got up from bed to find him laying in the floor. The patient was conscious without altered mental status.  He denied falling but stated that he was too weak to get up on his own which prompted him to seek medical evaluation. In the emergency department he was found to have a blood sugar greater than 700 without increased anion gap. After intravenous insulin emergency department staff called the hospitalist service for admission.  Past Medical History:  Diagnosis Date  . Aneurysm ([place])   . Arthritis   . CHF (congestive heart failure) ([place])   . Diabetes mellitus without complication ([place])   . Heart murmur    as a child- 'nothing to worry about'  . Sleep apnea     Past Surgical History:  Procedure Laterality Date  . BUNIONECTOMY Right   . CARDIAC CATHETERIZATION  2010  . CATARACT EXTRACTION W/PHACO Right 06/05/2012   Procedure: CATARACT EXTRACTION PHACO AND INTRAOCULAR LENS PLACEMENT (IOC);  Surgeon: Adonis Brook, MD;  Location: [place];  Service: Ophthalmology;  Laterality: Right;  . Colonscopy  2012    Family History  Problem Relation Age of Onset  . CAD Mother   . Cancer Sister    Social History:  reports that he has been smoking Cigarettes.  He has a 20.00  pack-year smoking history. He has never used smokeless tobacco. He reports that he drinks alcohol. He reports that he does not use drugs.  Allergies:  Allergies  Allergen Reactions  . Sulfa Antibiotics Hives    Patient states he is not allergic to this medication    Prior to Admission medications   Medication Sig Start Date End Date Taking? Authorizing Provider  aspirin 81 MG tablet Take 81 mg by mouth daily.    Historical Provider, MD  carvedilol (COREG) 3.125 MG tablet Take 3.125 mg by mouth 2 (two) times daily with a meal.    Historical Provider, MD  cephALEXin (KEFLEX) 500 MG capsule Take 500 mg by mouth 2 (two) times daily.    Historical Provider, MD  DILT-XR 120 MG 24 hr capsule  12/19/15   Historical Provider, MD  furosemide (LASIX) 40 MG tablet Take 80 mg by mouth daily.    Historical Provider, MD  insulin lispro protamine-lispro (HUMALOG 75/25) (75-25) 100 UNIT/ML SUSP Inject 25-30 Units into the skin 2 (two) times daily. 30 morning and 25 at bedtime    Historical Provider, MD  losartan (COZAAR) 50 MG tablet Take 50 mg by mouth daily.    Historical Provider, MD  potassium chloride (K-DUR,KLOR-CON) 10 MEQ tablet Take 10 mEq by mouth daily.    Historical Provider, MD  simvastatin (ZOCOR) 20 MG tablet Take 20 mg by mouth every evening.    Historical Provider, MD  torsemide [facility])  100 MG tablet  12/24/15   Historical Provider, MD     Results for orders placed or performed during the hospital encounter of 04/02/16 (from the past 48 hour(s))  Glucose, capillary     Status: Abnormal   Collection Time: 04/02/16 12:35 AM  Result Value Ref Range   Glucose-Capillary >600 (HH) 65 - 99 mg/dL  Glucose, capillary     Status: Abnormal   Collection Time: 04/02/16 12:38 AM  Result Value Ref Range   Glucose-Capillary >600 (HH) 65 - 99 mg/dL  CBC     Status: Abnormal   Collection Time: 04/02/16  1:02 AM  Result Value Ref Range   WBC 13.3 (H) 3.8 - 10.6 K/uL   RBC 6.40 (H) 4.40 - 5.90  MIL/uL   Hemoglobin 19.9 (H) 13.0 - 18.0 g/dL   HCT 60.9 (H) 40.0 - 52.0 %   MCV 95.3 80.0 - 100.0 fL   MCH 31.1 26.0 - 34.0 pg   MCHC 32.6 32.0 - 36.0 g/dL   RDW 13.8 11.5 - 14.5 %   Platelets 148 (L) 150 - 440 K/uL  Comprehensive metabolic panel     Status: Abnormal   Collection Time: 04/02/16  1:02 AM  Result Value Ref Range   Sodium 125 (L) 135 - 145 mmol/L   Potassium 4.9 3.5 - 5.1 mmol/L   Chloride 81 (L) 101 - 111 mmol/L   CO2 31 22 - 32 mmol/L   Glucose, Bld 706 (HH) 65 - 99 mg/dL    Comment: CRITICAL RESULT CALLED TO, READ BACK BY AND VERIFIED WITH KENNY PATEL AT 0140 04/02/16.PMH   BUN 96 (H) 6 - 20 mg/dL    Comment: RESULT CONFIRMED BY MANUAL DILUTION.PMH   Creatinine, Ser 3.00 (H) 0.61 - 1.24 mg/dL   Calcium 9.4 8.9 - 10.3 mg/dL   Total Protein 8.5 (H) 6.5 - 8.1 g/dL   Albumin 3.9 3.5 - 5.0 g/dL   AST 39 15 - 41 U/L   ALT 46 17 - 63 U/L   Alkaline Phosphatase 105 38 - 126 U/L   Total Bilirubin 1.9 (H) 0.3 - 1.2 mg/dL   GFR calc non Af Amer 20 (L) >60 mL/min   GFR calc Af Amer 23 (L) >60 mL/min    Comment: (NOTE) The eGFR has been calculated using the CKD EPI equation. This calculation has not been validated in all clinical situations. eGFR's persistently <60 mL/min signify possible Chronic Kidney Disease.    Anion gap 13 5 - 15  Influenza panel by PCR (type A & B)     Status: None   Collection Time: 04/02/16  1:02 AM  Result Value Ref Range   Influenza A By PCR NEGATIVE NEGATIVE   Influenza B By PCR NEGATIVE NEGATIVE    Comment: (NOTE) The Xpert Xpress Flu assay is intended as an aid in the diagnosis of  influenza and should not be used as a sole basis for treatment.  This  assay is FDA approved for nasopharyngeal swab specimens only. Nasal  washings and aspirates are unacceptable for Xpert Xpress Flu testing.   Urinalysis, Complete w Microscopic     Status: Abnormal   Collection Time: 04/02/16  1:02 AM  Result Value Ref Range   Color, Urine YELLOW (A)  YELLOW   APPearance CLEAR (A) CLEAR   Specific Gravity, Urine 1.015 1.005 - 1.030   pH 5.0 5.0 - 8.0   Glucose, UA >=500 (A) NEGATIVE mg/dL   Hgb urine dipstick NEGATIVE NEGATIVE  Bilirubin Urine NEGATIVE NEGATIVE   Ketones, ur NEGATIVE NEGATIVE mg/dL   Protein, ur 100 (A) NEGATIVE mg/dL   Nitrite NEGATIVE NEGATIVE   Leukocytes, UA NEGATIVE NEGATIVE   RBC / HPF 0-5 0 - 5 RBC/hpf   WBC, UA 0-5 0 - 5 WBC/hpf   Bacteria, UA NONE SEEN NONE SEEN   Squamous Epithelial / LPF 0-5 (A) NONE SEEN   Mucous PRESENT   Differential     Status: Abnormal   Collection Time: 04/02/16  1:02 AM  Result Value Ref Range   Neutrophils Relative % 66 %   Neutro Abs 8.4 (H) 1.4 - 6.5 K/uL   Lymphocytes Relative 25 %   Lymphs Abs 3.2 1.0 - 3.6 K/uL   Monocytes Relative 8 %   Monocytes Absolute 1.0 0.2 - 1.0 K/uL   Eosinophils Relative 1 %   Eosinophils Absolute 0.1 0 - 0.7 K/uL   Basophils Relative 0 %   Basophils Absolute 0.1 0 - 0.1 K/uL  Glucose, capillary     Status: Abnormal   Collection Time: 04/02/16  3:38 AM  Result Value Ref Range   Glucose-Capillary >600 (HH) 65 - 99 mg/dL  Glucose, capillary     Status: Abnormal   Collection Time: 04/02/16  5:26 AM  Result Value Ref Range   Glucose-Capillary >600 (HH) 65 - 99 mg/dL   Dg Chest 2 View  Result Date: 04/02/2016 CLINICAL DATA:  66 y/o  M; cough and dyspnea. EXAM: CHEST  2 VIEW COMPARISON:  06/05/2012 chest radiograph FINDINGS: Stable mild cardiomegaly. Aortic atherosclerosis with calcification. No focal consolidation. No pleural effusion. No pneumothorax. Mild degenerative changes of the thoracic spine. IMPRESSION: No active cardiopulmonary disease. Stable mild cardiomegaly. Aortic atherosclerosis. Electronically Signed   By: Kristine Garbe M.D.   On: 04/02/2016 01:10    Review of Systems  Constitutional: Negative for chills and fever.  HENT: Negative for sore throat and tinnitus.   Eyes: Negative for blurred vision and redness.   Respiratory: Positive for cough (chronic). Negative for shortness of breath.   Cardiovascular: Negative for chest pain, palpitations, orthopnea and PND.  Gastrointestinal: Negative for abdominal pain, diarrhea, nausea and vomiting.  Genitourinary: Negative for dysuria, frequency and urgency.  Musculoskeletal: Negative for joint pain and myalgias.  Skin: Negative for rash.       No lesions  Neurological: Positive for tremors. Negative for speech change, focal weakness and weakness.  Endo/Heme/Allergies: Does not bruise/bleed easily.       No temperature intolerance  Psychiatric/Behavioral: Negative for depression and suicidal ideas.    Blood pressure 123/71, pulse 79, temperature 97.9 F (36.6 C), temperature source Oral, resp. rate 20, SpO2 94 %. Physical Exam  Constitutional: He is oriented to person, place, and time. He appears well-developed and well-nourished. No distress.  HENT:  Head: Normocephalic and atraumatic.  Mouth/Throat: Oropharynx is clear and moist.  Eyes: Conjunctivae and EOM are normal. Pupils are equal, round, and reactive to light. No scleral icterus.  Neck: Normal range of motion. Neck supple. No JVD present. No tracheal deviation present. No thyromegaly present.  Cardiovascular: Normal rate and regular rhythm.  Exam reveals no gallop and no friction rub.   Murmur heard.  Systolic murmur is present with a grade of 3/6  Heard best over tricuspid and mitral areas  Respiratory: Effort normal and breath sounds normal. No respiratory distress.  GI: Soft. Bowel sounds are normal. He exhibits no distension. There is no tenderness.  Genitourinary:  Genitourinary Comments: Deferred  Musculoskeletal:  Normal range of motion. He exhibits no edema.  Lymphadenopathy:    He has no cervical adenopathy.  Neurological: He is alert and oriented to person, place, and time. No cranial nerve deficit.  Skin: Skin is warm and dry. No rash noted. No erythema.  Psychiatric: He has a  normal mood and affect. His behavior is normal. Judgment and thought content normal.     Assessment/Plan This is a 66 year old male admitted for hyperglycemia. 1. Hyperglycemia: Unclear etiology. The patient states that his diet is no different than usual (which admittedly is not per diabetic recommendations) but does not usually result in hyperglycemia of this magnitude. He denies constitutional symptoms. He has been eating and drinking like usual (although he admits to decreasing the amount of diuretic he had been taking as he had not made much urine the day before). He is received a total of 18 units of IV insulin in the emergency department and I started him on a gentle rate of normal saline due to his congestive heart failure. I have also given the patient his nightly dose of basal insulin as well as some corrective fast acting insulin subcutaneously. We will recheck a BMP for an accurate glucose reading. If still high start insulin drip. Pseudohyponatremia present secondary to hyperglycemia. 2. Leukocytosis: Likely secondary to hyperglycemia induced demarcation. Differential of CBC is normal. The patient is afebrile without wounds and has a clear chest x-ray. Follow for signs or symptoms of sepsis. 3. Congestive heart failure: Chronic; systolic. Murmurs consistent with tricuspid and mitral stenosis are present. Continue Lasix per home regimen. EF is unknown as I cannot find an echocardiogram in his records. As the patient will need continuous fluids to improve his blood sugars I have also ordered an echocardiogram to assess cardiac motion and output.  Continue Coreg 4. Hypertension: Controlled; hold losartan until kidney function improves. Continue diltiazem 5. CKD: Stage IV; check outpatient labs for baseline. Avoid nephrotoxic agents 6. Diabetes mellitus type 2: Continue basal insulin as well as sliding scale hospitalized. Hold oral hypoglycemic agents 7. Hyperlipidemia: Continue statin  therapy 8. AAA: no abdominal pain or pulsatile abdominal masses. Stable at 3.4 cm last ultrasound. 9. DVT prophylaxis: Heparin 10. GI prophylaxis: None The patient is a full code. Time spent on admission orders and patient care approximately 45 minutes   Harrie Foreman, MD 04/02/2016, 7:16 AM

## 2016-04-02 NOTE — ED Notes (Signed)
Per Dr. Claudie Fishermanhin will receive 28units on insulin aspart due to above 400 on the sliding scale

## 2016-04-02 NOTE — ED Provider Notes (Signed)
[facility] Emergency Department Provider Note   I have reviewed the triage vital signs and the nursing notes.   HISTORY  Chief Complaint Weakness    HPI Jeremy Gutierrez is a 66 y.o. male with bolus of chronic medical conditions presents to the emergency department with generalized weakness, unproductive cough and congestion. Patient states that he's noted progressive generalized weakness today. Patient denies any headache no nausea vomiting diarrhea. Patient denies any fever,Dyspnea.   Past Medical History:  Diagnosis Date  . Aneurysm ([facility])   . Arthritis   . CHF (congestive heart failure) ([facility])   . Diabetes mellitus without complication ([facility])   . Heart murmur    as a child- 'nothing to worry about'  . Sleep apnea     Patient Active Problem List   Diagnosis Date Noted  . Hyperglycemia 04/02/2016  . Essential hypertension, benign 02/29/2016  . AAA (abdominal aortic aneurysm) without rupture ([facility]) 02/29/2016  . Diabetes ([facility]) 02/29/2016  . Abscess of right thigh 10/29/2012    Past Surgical History:  Procedure Laterality Date  . BUNIONECTOMY Right   . CARDIAC CATHETERIZATION  2010  . CATARACT EXTRACTION W/PHACO Right 06/05/2012   Procedure: CATARACT EXTRACTION PHACO AND INTRAOCULAR LENS PLACEMENT (IOC);  Surgeon: Shade FloodGreer Geiger, MD;  Location[facility] OR;  Service: Ophthalmology;  Laterality: Right;  . Colonscopy  2012    Prior to Admission medications   Medication Sig Start Date End Date Taking? Authorizing Provider  aspirin 81 MG tablet Take 81 mg by mouth daily.    Historical Provider, MD  carvedilol (COREG) 3.125 MG tablet Take 3.125 mg by mouth 2 (two) times daily with a meal.    Historical Provider, MD  cephALEXin (KEFLEX) 500 MG capsule Take 500 mg by mouth 2 (two) times daily.    Historical Provider, MD  DILT-XR 120 MG 24 hr capsule  12/19/15   Historical Provider, MD  furosemide (LASIX) 40 MG tablet Take 80 mg by mouth daily.    Historical  Provider, MD  insulin lispro protamine-lispro (HUMALOG 75/25) (75-25) 100 UNIT/ML SUSP Inject 25-30 Units into the skin 2 (two) times daily. 30 morning and 25 at bedtime    Historical Provider, MD  losartan (COZAAR) 50 MG tablet Take 50 mg by mouth daily.    Historical Provider, MD  potassium chloride (K-DUR,KLOR-CON) 10 MEQ tablet Take 10 mEq by mouth daily.    Historical Provider, MD  simvastatin (ZOCOR) 20 MG tablet Take 20 mg by mouth every evening.    Historical Provider, MD  torsemide (DEMADEX) 100 MG tablet  12/24/15   Historical Provider, MD    Allergies Sulfa antibiotics  History reviewed. No pertinent family history.  Social History Social History  Substance Use Topics  . Smoking status: Current Every Day Smoker    Packs/day: 0.50    Years: 40.00    Types: Cigarettes    Last attempt to quit: 06/02/2012  . Smokeless tobacco: Never Used     Comment: smoked for many years.  . Alcohol use Yes     Comment: social    Review of Systems Constitutional: No fever/chills Eyes: No visual changes. ENT: No sore throat. Cardiovascular: Denies chest pain. Respiratory: Denies shortness of breath. Gastrointestinal: No abdominal pain.  No nausea, no vomiting.  No diarrhea.  No constipation. Genitourinary: Negative for dysuria. Musculoskeletal: Negative for back pain. Skin: Negative for rash. Neurological: Negative for headaches, Positive for generalized weakness and fatigue.  10-point ROS otherwise negative.  ____________________________________________  PHYSICAL EXAM:  VITAL SIGNS: ED Triage Vitals  Enc Vitals Group     BP      Pulse      Resp      Temp      Temp src      SpO2      Weight      Height      Head Circumference      Peak Flow      Pain Score      Pain Loc      Pain Edu?      Excl. in GC?     Constitutional: Alert and oriented. Well appearing and in no acute distress. Eyes: Conjunctivae are normal. PERRL. EOMI. Head: Atraumatic. Mouth/Throat:  Mucous membranes are dry. Oropharynx non-erythematous. Neck: No stridor. No cervical spine tenderness to palpation. Cardiovascular: Normal rate, regular rhythm. Good peripheral circulation. Grossly normal heart sounds. Respiratory: Normal respiratory effort.  No retractions. Lungs CTAB. Gastrointestinal: Soft and nontender. No distention.  Musculoskeletal: No lower extremity tenderness nor edema. No gross deformities of extremities. Neurologic:  Normal speech and language. No gross focal neurologic deficits are appreciated.  Skin:  Skin is warm, dry and intact. No rash noted. Psychiatric: Mood and affect are normal. Speech and behavior are normal.  ____________________________________________   LABS (all labs ordered are listed, but only abnormal results are displayed)  Labs Reviewed  CBC - Abnormal; Notable for the following:       Result Value   WBC 13.3 (*)    RBC 6.40 (*)    Hemoglobin 19.9 (*)    HCT 60.9 (*)    Platelets 148 (*)    All other components within normal limits  COMPREHENSIVE METABOLIC PANEL - Abnormal; Notable for the following:    Sodium 125 (*)    Chloride 81 (*)    Glucose, Bld 706 (*)    BUN 96 (*)    Creatinine, Ser 3.00 (*)    Total Protein 8.5 (*)    Total Bilirubin 1.9 (*)    GFR calc non Af Amer 20 (*)    GFR calc Af Amer 23 (*)    All other components within normal limits  URINALYSIS, COMPLETE (UACMP) WITH MICROSCOPIC - Abnormal; Notable for the following:    Color, Urine YELLOW (*)    APPearance CLEAR (*)    Glucose, UA >=500 (*)    Protein, ur 100 (*)    Squamous Epithelial / LPF 0-5 (*)    All other components within normal limits  GLUCOSE, CAPILLARY - Abnormal; Notable for the following:    Glucose-Capillary >600 (*)    All other components within normal limits  GLUCOSE, CAPILLARY - Abnormal; Notable for the following:    Glucose-Capillary >600 (*)    All other components within normal limits  DIFFERENTIAL - Abnormal; Notable for the  following:    Neutro Abs 8.4 (*)    All other components within normal limits  CULTURE, BLOOD (ROUTINE X 2)  CULTURE, BLOOD (ROUTINE X 2)  INFLUENZA PANEL BY PCR (TYPE A & B)   ____________________________________________  EKG  ED ECG REPORT I, [place] Tyliyah Mcmeekin, the attending physician, personally viewed and interpreted this ECG.   Date: 04/02/2016  EKG Time: 12:24 AM  Rate: 74  Rhythm: Normal sinus rhythm with right bundle-branch block  Axis: Normal  Intervals: Prolonged PR interval  ST&T Change: None  ____________________________________________  RADIOLOGY I, [place] Althea Backs, personally viewed and evaluated these images (plain radiographs) as part  of my medical decision making, as well as reviewing the written report by the radiologist.  Dg Chest 2 View  Result Date: 04/02/2016 CLINICAL DATA:  66 y/o  M; cough and dyspnea. EXAM: CHEST  2 VIEW COMPARISON:  06/05/2012 chest radiograph FINDINGS: Stable mild cardiomegaly. Aortic atherosclerosis with calcification. No focal consolidation. No pleural effusion. No pneumothorax. Mild degenerative changes of the thoracic spine. IMPRESSION: No active cardiopulmonary disease. Stable mild cardiomegaly. Aortic atherosclerosis. Electronically Signed   By: Mitzi Hansen M.D.   On: 04/02/2016 01:10     Procedures     INITIAL IMPRESSION / ASSESSMENT AND PLAN / ED COURSE  Pertinent labs & imaging results that were available during my care of the patient were reviewed by me and considered in my medical decision making (see chart for details).  Patient's glucose greater than 600 on arrival to the emergency department. Patient given a units of IV insulin. Check 6 are revealed no evidence of pneumonia influenza negative. Patient discussed with Dr. Sheryle Hail for hospital admission      ____________________________________________  FINAL CLINICAL IMPRESSION(S) / ED DIAGNOSES  Final diagnoses:  Hyperglycemia     MEDICATIONS  GIVEN DURING THIS VISIT:  Medications  insulin aspart (novoLOG) injection 8 Units (8 Units Intravenous Given 04/02/16 0158)     NEW OUTPATIENT MEDICATIONS STARTED DURING THIS VISIT:  New Prescriptions   No medications on file    Modified Medications   No medications on file    Discontinued Medications   No medications on file     Note:  This document was prepared using Dragon voice recognition software and may include unintentional dictation errors.    Darci Current, MD 04/02/16 [phone]

## 2016-04-02 NOTE — Progress Notes (Signed)
CH responded to an OR for an AD. Patient presented in pain and was being prepped for a procedure. Pt and CH agreed that this was not a good time for education. Pt asked for the materials to be left. CH left materials on the bed tray and will educate at Pt request.    04/02/16 1330  Clinical Encounter Type  Visited With Patient  Visit Type Initial;Spiritual support  Referral From Nurse  Spiritual Encounters  Spiritual Needs Literature  Advance Directives (For Healthcare)  Does Patient Have a Medical Advance Directive? No  Would patient like information on creating a medical advance directive? Yes (Inpatient - patient requests chaplain consult to create a medical advance directive)  Mental Health Advance Directives  Does Patient Have a Mental Health Advance Directive? No  Would patient like information on creating a mental health advance directive? Yes (Inpatient - patient requests chaplain consult to create a mental health advance directive)

## 2016-04-03 ENCOUNTER — Inpatient Hospital Stay: Payer: Medicare Other

## 2016-04-03 LAB — BASIC METABOLIC PANEL
Anion gap: 10 (ref 5–15)
BUN: 96 mg/dL — ABNORMAL HIGH (ref 6–20)
CHLORIDE: 90 mmol/L — AB (ref 101–111)
CO2: 32 mmol/L (ref 22–32)
CREATININE: 2.58 mg/dL — AB (ref 0.61–1.24)
Calcium: 9.5 mg/dL (ref 8.9–10.3)
GFR calc non Af Amer: 24 mL/min — ABNORMAL LOW (ref >60)
GFR, EST AFRICAN AMERICAN: 28 mL/min — AB (ref >60)
Glucose, Bld: 142 mg/dL — ABNORMAL HIGH (ref 65–99)
POTASSIUM: 4 mmol/L (ref 3.5–5.1)
SODIUM: 132 mmol/L — AB (ref 135–145)

## 2016-04-03 LAB — CBC
HEMATOCRIT: 58.7 % — AB (ref 40.0–52.0)
HEMOGLOBIN: 19.6 g/dL — AB (ref 13.0–18.0)
MCH: 31 pg (ref 26.0–34.0)
MCHC: 33.4 g/dL (ref 32.0–36.0)
MCV: 92.9 fL (ref 80.0–100.0)
Platelets: 139 10*3/uL — ABNORMAL LOW (ref 150–440)
RBC: 6.32 MIL/uL — ABNORMAL HIGH (ref 4.40–5.90)
RDW: 13.8 % (ref 11.5–14.5)
WBC: 15.9 10*3/uL — ABNORMAL HIGH (ref 3.8–10.6)

## 2016-04-03 LAB — GLUCOSE, CAPILLARY
Glucose-Capillary: 164 mg/dL — ABNORMAL HIGH (ref 65–99)
Glucose-Capillary: 317 mg/dL — ABNORMAL HIGH (ref 65–99)
Glucose-Capillary: 286 mg/dL — ABNORMAL HIGH (ref 65–99)
Glucose-Capillary: >600 mg/dL (ref 65–99)
Glucose-Capillary: 94 mg/dL (ref 65–99)

## 2016-04-03 LAB — ECHOCARDIOGRAM COMPLETE
Height: 68 in
Weight: 3321.6 oz

## 2016-04-03 LAB — HEMOGLOBIN A1C
Hgb A1c MFr Bld: 13.2 % — ABNORMAL HIGH (ref 4.8–5.6)
Mean Plasma Glucose: 332 mg/dL

## 2016-04-03 MED ORDER — ENOXAPARIN SODIUM 40 MG/0.4ML ~~LOC~~ SOLN
40.0000 mg | SUBCUTANEOUS | Status: DC
  Administered 2016-04-03: 40 mg via SUBCUTANEOUS
  Filled 2016-04-03: qty 0.4

## 2016-04-03 MED ORDER — INSULIN ASPART PROT & ASPART (70-30 MIX) 100 UNIT/ML ~~LOC~~ SUSP
30.0000 [IU] | Freq: Two times a day (BID) | SUBCUTANEOUS | Status: DC
  Administered 2016-04-03: 30 [IU] via SUBCUTANEOUS
  Filled 2016-04-03: qty 30

## 2016-04-03 MED ORDER — SIMVASTATIN 10 MG PO TABS
10.0000 mg | ORAL_TABLET | Freq: Every evening | ORAL | Status: DC
  Administered 2016-04-03 – 2016-04-04 (×2): 10 mg via ORAL
  Filled 2016-04-03: qty 1

## 2016-04-03 NOTE — Progress Notes (Signed)
cpap refused. Pt states he prefers to use his own machine which his daughter will bring in.

## 2016-04-03 NOTE — Progress Notes (Signed)
[facility] - [place] [facility]   PATIENT NAME: Jeremy Gutierrez    MR#:  [id number]  DATE OF BIRTH:  03/17/1950  SUBJECTIVE:  CHIEF COMPLAINT:   Chief Complaint  Patient presents with  . Weakness   No Complaint. Blood sugar is better controlled. REVIEW OF SYSTEMS:  Review of Systems  Constitutional: Positive for malaise/fatigue. Negative for chills and fever.  HENT: Negative for congestion.   Eyes: Negative for blurred vision and double vision.  Respiratory: Negative for cough, shortness of breath, wheezing and stridor.   Cardiovascular: Negative for chest pain and leg swelling.  Gastrointestinal: Negative for abdominal pain, blood in stool, constipation, diarrhea, melena, nausea and vomiting.  Genitourinary: Negative for dysuria and hematuria.  Musculoskeletal: Negative for joint pain.  Neurological: Positive for weakness. Negative for dizziness, focal weakness and loss of consciousness.  Psychiatric/Behavioral: Negative for depression. The patient is not nervous/anxious.     DRUG ALLERGIES:   Allergies  Allergen Reactions  . Sulfa Antibiotics Hives    Patient states he is not allergic to this medication   VITALS:  Blood pressure 131/66, pulse 66, temperature 98.5 F (36.9 C), temperature source Oral, resp. rate 18, height 5\' 8"  (1.727 m), weight 208 lb 3.2 oz (94.4 kg), SpO2 96 %. PHYSICAL EXAMINATION:  Physical Exam  Constitutional: He is oriented to person, place, and time and well-developed, well-nourished, and in no distress.  HENT:  Head: Normocephalic.  Mouth/Throat: Oropharynx is clear and moist.  Eyes: Conjunctivae and EOM are normal. No scleral icterus.  Neck: Normal range of motion. Neck supple. No JVD present. No tracheal deviation present.  Cardiovascular: Normal rate, regular rhythm and normal heart sounds.  Exam reveals no gallop.   No murmur heard. Pulmonary/Chest: Effort normal and breath sounds normal. No respiratory distress. He  has no wheezes. He has no rales.  Abdominal: Soft. Bowel sounds are normal. He exhibits no distension. There is no tenderness.  Musculoskeletal: Normal range of motion. He exhibits no edema or tenderness.  Neurological: He is alert and oriented to person, place, and time. No cranial nerve deficit.  Skin: No rash noted. No erythema.  Psychiatric: Affect normal.   LABORATORY PANEL:   CBC  Recent Labs Lab 04/03/16 0330  WBC 15.9*  HGB 19.6*  HCT 58.7*  PLT 139*   ------------------------------------------------------------------------------------------------------------------ Chemistries   Recent Labs Lab 04/02/16 0102 04/02/16 0740 04/03/16 0330  NA 125* 127* 132*  K 4.9 4.4 4.0  CL 81* 83* 90*  CO2 31 30 32  GLUCOSE 706* 573* 142*  BUN 96* 103* 96*  CREATININE 3.00* 3.07* 2.58*  CALCIUM 9.4 9.7 9.5  MG  --  2.4  --   AST 39  --   --   ALT 46  --   --   ALKPHOS 105  --   --   BILITOT 1.9*  --   --    RADIOLOGY:  No results found. ASSESSMENT AND PLAN:   This is a 66 year old male admitted for hyperglycemia. The patient complains of generalized weakness. Blood sugar was high more than 600 in the ED. He was treated with insulin several times and start Levemir with NovoLog 8 units before meals. Blood sugar is better at 361.  Vital signs reviewed and stable. Physical examinations is unremarkable.  1. Hyperglycemia and DM.  BS is better controlled. on sliding scale, Levemir with NovoLog 8 units before meals. Changed to NovoLog 70/30, 30 units bid (home meds). Hemoglobin A1c 13.2.  2.  Leukocytosis: Likely secondary to hyperglycemia induced demarcation. Differential of CBC is normal. The patient is afebrile without wounds and has a clear chest x-ray. Follow-up CBC.  3. Congestive heart failure: Chronic; systolic. Murmurs consistent with tricuspid and mitral stenosis are present.  hold Lasix due to renal failure. echocardiogram: Systolic function was normal. The  estimated ejection fraction was in the range of 60% to 65%. Continue Coreg.  4. Hypertension: Controlled; hold losartan until kidney function improves. Continue diltiazem  5. 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 Hold IV fluid and follow-up BMP per Dr. Thedore Mins. Nephrology consult. Avoid nephrotoxic agents, hold losartan.  6. Hyperlipidemia: Continue statin therapy 7. AAA: no abdominal pain or pulsatile abdominal masses. Stable at 3.4 cm last ultrasound.  Elevated bili. Abd [place].  Tobacco abuse. Smoking cessation was counseled for 4-5 minutes, nicotine patch.  All the records are reviewed and case discussed with Care Management/Social Worker. Management plans discussed with the patient, family and they are in agreement.  CODE STATUS: full code.  TOTAL TIME TAKING CARE OF THIS PATIENT: 33 minutes.   More than 50% of the time was spent in counseling/coordination of care: YES  POSSIBLE D/C IN 2 DAYS, DEPENDING ON CLINICAL CONDITION.   Shaune Pollack M.D on 04/03/2016 at 3:36 PM  Between 7am to 6pm - Pager - [phone]  After 6pm go to [email or web address] - Social research officer, government  [facility]  Office  [phone]  CC: Primary care physician; Derwood Kaplan, MD  Note: This dictation was prepared with Dragon dictation along with smaller phrase technology. Any transcriptional errors that result from this process are unintentional.

## 2016-04-03 NOTE — Progress Notes (Signed)
PT Cancellation Note  Patient Details Name: Jeremy Gutierrez MRN: [id number] DOB: 05/11/1950   Cancelled Treatment:    Reason Eval/Treat Not Completed: Patient declined, no reason specified.  Pt firmly reporting that he is not going to do any physical therapy today.  Will re-attempt PT eval at a later date/time (pending pt's willingness to participate).  Irving Burtonmily Kameren Baade 04/03/2016, 12:08 PM Hendricks LimesEmily Crissie Aloi, PT [phone]

## 2016-04-03 NOTE — Progress Notes (Signed)
Inpatient Diabetes Program Recommendations  AACE/ADA: New Consensus Statement on Inpatient Glycemic Control (2015)  Target Ranges:  Prepandial:   less than 140 mg/dL      Peak postprandial:   less than 180 mg/dL (1-2 hours)      Critically ill patients:  140 - 180 mg/dL   Results for Merideth AbbeyLSTON, Damione L (MRN [id number]) as of 04/03/2016 09:14  Ref. Range 04/02/2016 00:38 04/02/2016 03:38 04/02/2016 05:26 04/02/2016 09:49 04/02/2016 10:50 04/02/2016 10:51 04/02/2016 12:03 04/02/2016 16:34 04/02/2016 18:07 04/02/2016 19:29 04/02/2016 21:14 04/03/2016 07:34  Glucose-Capillary Latest Ref Range: 65 - 99 mg/dL >409>600 (HH) >811>600 (HH) >914>600 (HH) 553 (HH) 479 (H) 452 (H) 361 (H) 74 88 167 (H) 138 (H) 164 (H)    Admit with: Weakness/ Hyperglycemia  History: DM, CHF  Home DM Meds: Humalog 75/25 Insulin- 30 units AM/ 25 units PM  Current Insulin Orders: 70/30 Insulin- 30 units BID      Novolog Resistant Correction Scale/ SSI (0-20 units) TID AC       -Note patient profoundly hyperglycemic on admission.    -Was given several doses of insulin to bring glucose levels down last PM: Novolog 8 units 2am Novolog 5 units 4am Levemir 40 units 4am Regular insulin 10 units 4am Novolog 28 units 9am Novolog 20 units 1pm  -CBG this AM down to 164 mg/dl.  Due to receive 30 units 70/30 insulin this AM.    --Will follow patient during hospitalization--  Ambrose FinlandJeannine Johnston Drevin Ortner RN, MSN, CDE Diabetes Coordinator Inpatient Glycemic Control Team Team Pager: [phone] (8a-5p)

## 2016-04-03 NOTE — Progress Notes (Signed)
Inpatient Diabetes Program Recommendations  AACE/ADA: New Consensus Statement on Inpatient Glycemic Control (2015)  Target Ranges:  Prepandial:   less than 140 mg/dL      Peak postprandial:   less than 180 mg/dL (1-2 hours)      Critically ill patients:  140 - 180 mg/dL      MD- Note patient refused dose of 70/30 insulin this AM.  CBGs will likely run high this afternoon given pt's refusal to take 70/30 insulin.  CBG at 12pm: 317 mg/dl.  Patient did agree to take 15 units Novolog SSI.      --Will follow patient during hospitalization--  Ambrose FinlandJeannine Johnston Adriyana Greenbaum RN, MSN, CDE Diabetes Coordinator Inpatient Glycemic Control Team Team Pager: [phone] (8a-5p)

## 2016-04-03 NOTE — Progress Notes (Deleted)
Patient found multiple in soiled gown and bed.  When patient was asked about being able to use the urinal he states "I know I'm wet and I'm fine."  At times patient uses urinal appropriately. Patient allows nurse and nurse aid to clean him.  Patient appears to be confused at times and then oriented at others.  Recently patient was found urinating in water pitcher when urinal was within reach.  Dr. Imogene Burnhen paged to be notified of strange behavior.

## 2016-04-03 NOTE — Progress Notes (Signed)
Subjective:   Patient known to our practice from outpatient and is followed by Dr. Wynelle Link for chronic kidney disease. His baseline creatinine is 1.33/GFR of 63 from January 2018 He presented to the hospital via EMS for generalized weakness. Upon arrival his blood pressure was noted to be low at 86/56. He was given IV fluid bolus. He also has nonproductive cough for the past few weeks. Otherwise no complaints of fevers or chills. No nausea or vomiting. No diarrhea. No blood in the urine.  At admission, his blood sugar was extremely high at 706, creatinine was high at 3.0 and BUN 96. Today's labs show that his creatinine has improved to 2.58 and BUN is high at 96.  Patient is able to eat without any nausea or vomiting. Denies any acute shortness of breath. He is sitting up in the bed eating lunch. Additional lab findings show that his hemoglobin A1c is 13.2. His hemoglobin upon admission was 19.6  Patient also reports that he was given diuretics as outpatient which caused him to have significant urine output. He also had cramping.  Objective:  Vital signs in last 24 hours:  Temp:  [98.3 F (36.8 C)-98.5 F (36.9 C)] 98.3 F (36.8 C) (02/12 0734) Pulse Rate:  [68-90] 90 (02/12 0734) Resp:  [18] 18 (02/12 0734) BP: (111-147)/(55-77) 138/77 (02/12 0734) SpO2:  [95 %-97 %] 95 % (02/12 0800) Weight:  [94.4 kg (208 lb 3.2 oz)] 94.4 kg (208 lb 3.2 oz) (02/12 0436)  Weight change:  Filed Weights   04/02/16 1252 04/03/16 0436  Weight: 94.2 kg (207 lb 9.6 oz) 94.4 kg (208 lb 3.2 oz)    Intake/Output:    Intake/Output Summary (Last 24 hours) at 04/03/16 1428 Last data filed at 04/03/16 1201  Gross per 24 hour  Intake           2182.5 ml  Output             1000 ml  Net           1182.5 ml     Physical Exam: General: No acute distress, sitting up in bed   HEENT Anicteric, moist oral mucous membranes   Neck Supple, no JVD   Pulm/lungs Normal breathing effort, coarse breath sounds    CVS/Heart No rub or gallop   Abdomen:  Soft nontender   Extremities: No significant edema   Neurologic: Alert, able to answer questions   Skin: No acute rashes           Basic Metabolic Panel:   Recent Labs Lab 04/02/16 0102 04/02/16 0740 04/03/16 0330  NA 125* 127* 132*  K 4.9 4.4 4.0  CL 81* 83* 90*  CO2 31 30 32  GLUCOSE 706* 573* 142*  BUN 96* 103* 96*  CREATININE 3.00* 3.07* 2.58*  CALCIUM 9.4 9.7 9.5  MG  --  2.4  --      CBC:  Recent Labs Lab 04/02/16 0102 04/03/16 0330  WBC 13.3* 15.9*  NEUTROABS 8.4*  --   HGB 19.9* 19.6*  HCT 60.9* 58.7*  MCV 95.3 92.9  PLT 148* 139*      Microbiology:  Recent Results (from the past 720 hour(s))  CULTURE, BLOOD (ROUTINE X 2) w Reflex to ID Panel     Status: None (Preliminary result)   Collection Time: 04/02/16  1:02 AM  Result Value Ref Range Status   Specimen Description BLOOD LEFT ANTECUBITAL  Final   Special Requests   Final    BOTTLES  DRAWN AEROBIC AND ANAEROBIC AER 11CC,ANA 10CC   Culture NO GROWTH 1 DAY  Final   Report Status PENDING  Incomplete  CULTURE, BLOOD (ROUTINE X 2) w Reflex to ID Panel     Status: None (Preliminary result)   Collection Time: 04/02/16  1:02 AM  Result Value Ref Range Status   Specimen Description BLOOD RIGHT ANTECUBITAL  Final   Special Requests   Final    BOTTLES DRAWN AEROBIC AND ANAEROBIC AER 9CC,ANA 11CC   Culture NO GROWTH 1 DAY  Final   Report Status PENDING  Incomplete    Coagulation Studies: No results for input(s): LABPROT, INR in the last 72 hours.  Urinalysis:  Recent Labs  04/02/16 0102  COLORURINE YELLOW*  LABSPEC 1.015  PHURINE 5.0  GLUCOSEU >=500*  HGBUR NEGATIVE  BILIRUBINUR NEGATIVE  KETONESUR NEGATIVE  PROTEINUR 100*  NITRITE NEGATIVE  LEUKOCYTESUR NEGATIVE      Imaging: Dg Chest 2 View  Result Date: 04/02/2016 CLINICAL DATA:  66 y/o  M; cough and dyspnea. EXAM: CHEST  2 VIEW COMPARISON:  06/05/2012 chest radiograph FINDINGS: Stable  mild cardiomegaly. Aortic atherosclerosis with calcification. No focal consolidation. No pleural effusion. No pneumothorax. Mild degenerative changes of the thoracic spine. IMPRESSION: No active cardiopulmonary disease. Stable mild cardiomegaly. Aortic atherosclerosis. Electronically Signed   By: Mitzi HansenLance  Furusawa-Stratton M.D.   On: 04/02/2016 01:10     Medications:   . sodium chloride Stopped (04/03/16 1201)   . aspirin  81 mg Oral Daily  . carvedilol  3.125 mg Oral BID WC  . diltiazem  120 mg Oral Daily  . docusate sodium  100 mg Oral BID  . heparin  5,000 Units Subcutaneous Q8H  . insulin aspart  0-20 Units Subcutaneous TID WC  . insulin aspart protamine- aspart  30 Units Subcutaneous BID WC  . mouth rinse  15 mL Mouth Rinse BID  . nicotine  21 mg Transdermal Daily  . potassium chloride  10 mEq Oral Daily  . simvastatin  20 mg Oral QPM   acetaminophen **OR** acetaminophen, ondansetron **OR** ondansetron (ZOFRAN) IV  Assessment/ Plan:  66 y.o. African American male  with long-standing diabetes, insulin dependent, congestive heart failure, sleep apnea, Coronary disease, hyperlipidemia, hypertension, peripheral vascular disease, pulmonary hypertension, tobacco abuse, proteinuria  1. Acute renal failure on chronic kidney disease stage III  Baseline creatinine 1.3 from January 2018 Acute renal failure is likely secondary to overdiuresis from outpatient administration of diuretics. At present, volume status appears to have stabilized. Continue to hold further diuretics for now. Discontinue maintenance IV normal saline. Continue Na restriction to 2 g per day Monitor labs on a daily basis  2. Diabetes type 2 with chronic kidney disease Hemoglobin A1c 13.2% Counseled patient regarding better control of blood sugars  3. Proteinuria Outpatient urine protein/creatinine ratio was 1.2 g Patient gets losartan as outpatient. Currently it is on hold due to renal insufficiency. May restart at  the time of discharge.    LOS: 1 Ragan Reale 2/12/20182:28 PM

## 2016-04-03 NOTE — Progress Notes (Signed)
Patient found multiple in soiled gown and bed.  When patient was asked about being able to use the urinal he states "I know I'm wet and I'm fine."  At times patient uses urinal appropriately. Patient allows nurse and nurse aid to clean him.  Patient appears to be confused at times and then oriented at others.  Recently patient was found urinating in water pitcher when urinal was within reach. Dr. Allena KatzPatel notified.  Order obtained for CT of head.

## 2016-04-03 NOTE — Progress Notes (Signed)
PT Cancellation Note  Patient Details Name: Jeremy AbbeyBarry L Pawling MRN: [id number] DOB: 10/01/1950   Cancelled Treatment:    Reason Eval/Treat Not Completed: Patient declined, no reason specified.  Pt reporting he was not going to participate in any PT until he received his breakfast (pt reports calling breakfast in already) and "might" participate in PT after that.  Will re-attempt PT eval at a later date/time.   Irving Burtonmily Tyrann Donaho 04/03/2016, 9:19 AM Hendricks LimesEmily Bertran Zeimet, PT [phone]

## 2016-04-03 NOTE — Progress Notes (Signed)
Nurse called to patients room due to patient pulling IV out.  When patient questioned about why he removed his own he stated " I had to get up and go to bathroom, so I took it out." Dr. Imogene Burnhen notified.

## 2016-04-04 ENCOUNTER — Inpatient Hospital Stay: Payer: Medicare Other

## 2016-04-04 LAB — COMPREHENSIVE METABOLIC PANEL
ALT: 36 U/L (ref 17–63)
ANION GAP: 11 (ref 5–15)
AST: 39 U/L (ref 15–41)
Albumin: 3.6 g/dL (ref 3.5–5.0)
Alkaline Phosphatase: 80 U/L (ref 38–126)
BUN: 86 mg/dL — AB (ref 6–20)
CHLORIDE: 96 mmol/L — AB (ref 101–111)
CO2: 26 mmol/L (ref 22–32)
Calcium: 9.4 mg/dL (ref 8.9–10.3)
Creatinine, Ser: 1.88 mg/dL — ABNORMAL HIGH (ref 0.61–1.24)
GFR, EST AFRICAN AMERICAN: 41 mL/min — AB (ref >60)
GFR, EST NON AFRICAN AMERICAN: 35 mL/min — AB (ref >60)
Glucose, Bld: 194 mg/dL — ABNORMAL HIGH (ref 65–99)
POTASSIUM: 4.4 mmol/L (ref 3.5–5.1)
Sodium: 133 mmol/L — ABNORMAL LOW (ref 135–145)
Total Bilirubin: 2.1 mg/dL — ABNORMAL HIGH (ref 0.3–1.2)
Total Protein: 8.1 g/dL (ref 6.5–8.1)

## 2016-04-04 LAB — CBC
HCT: 57.2 % — ABNORMAL HIGH (ref 40.0–52.0)
Hemoglobin: 19.1 g/dL — ABNORMAL HIGH (ref 13.0–18.0)
MCH: 31.1 pg (ref 26.0–34.0)
MCHC: 33.4 g/dL (ref 32.0–36.0)
MCV: 93.1 fL (ref 80.0–100.0)
PLATELETS: 133 10*3/uL — AB (ref 150–440)
RBC: 6.14 MIL/uL — ABNORMAL HIGH (ref 4.40–5.90)
RDW: 13.5 % (ref 11.5–14.5)
WBC: 13.9 10*3/uL — ABNORMAL HIGH (ref 3.8–10.6)

## 2016-04-04 LAB — GLUCOSE, CAPILLARY
Glucose-Capillary: 267 mg/dL — ABNORMAL HIGH (ref 65–99)
Glucose-Capillary: 239 mg/dL — ABNORMAL HIGH (ref 65–99)
Glucose-Capillary: 166 mg/dL — ABNORMAL HIGH (ref 65–99)

## 2016-04-04 MED ORDER — INSULIN LISPRO PROT & LISPRO (75-25 MIX) 100 UNIT/ML ~~LOC~~ SUSP: 32.0000 [IU] | Freq: Two times a day (BID) | SUBCUTANEOUS | 2 refills | Status: DC

## 2016-04-04 MED ORDER — INSULIN ASPART PROT & ASPART (70-30 MIX) 100 UNIT/ML ~~LOC~~ SUSP
32.0000 [IU] | Freq: Two times a day (BID) | SUBCUTANEOUS | Status: DC
  Administered 2016-04-04: 32 [IU] via SUBCUTANEOUS
  Filled 2016-04-04: qty 32

## 2016-04-04 NOTE — Discharge Instructions (Signed)
Heart healthy and ADA diet. °

## 2016-04-04 NOTE — Evaluation (Signed)
Physical Therapy Evaluation Patient Details Name: Jeremy Gutierrez MRN: [id number] DOB: [birth year] Today's Date: 04/04/2016   History of Present Illness  Pt is a 66 y.o. male presenting to hospital with generalized weakness, cough, and congestion.  Pt admitted to hospital with hyperglycemia and leukocytosis.  PMH includes aneurysm, CHF, DM, heart murmur, AAA without rupture.  Clinical Impression  Prior to hospital admission, pt was independent with functional mobility.  Pt lives with his daughter and 2 y.o. granddaughter in 2 level home.  Currently pt is independent with bed mobility and transfers, SBA with ambulation 240 feet no AD, and CGA navigating stairs.  Pt's LE's appeared to fatigue with distance/activity but no loss of balance noted during session.  Pt would benefit from skilled PT to address noted impairments and functional limitations during hospital stay.  Recommend pt discharge to home with support of family when medically appropriate.    Follow Up Recommendations No PT follow up    Equipment Recommendations  None recommended by PT    Recommendations for Other Services       Precautions / Restrictions Precautions Precautions: Fall Restrictions Weight Bearing Restrictions: No      Mobility  Bed Mobility Overal bed mobility: Independent             General bed mobility comments: Supine to/from sit without any difficulty  Transfers Overall transfer level: Independent Equipment used: None             General transfer comment: strong stand; steady without loss of balance  Ambulation/Gait Ambulation/Gait assistance: Supervision Ambulation Distance (Feet): 240 Feet Assistive device: None Gait Pattern/deviations: Step-through pattern   Gait velocity interpretation: at or above normal speed for age/gender General Gait Details: pt steady without loss of balance; LE's appeared to fatigue though with distance and stairs navigation  Stairs Stairs: Yes Stairs  assistance: Min guard Stair Management: One rail Left;Forwards Number of Stairs: 4 General stair comments: alternating pattern ascending; step to pattern descending  Wheelchair Mobility    Modified Rankin (Stroke Patients Only)       Balance Overall balance assessment: Needs assistance Sitting-balance support: No upper extremity supported;Feet supported Sitting balance-Leahy Scale: Normal     Standing balance support: No upper extremity supported;During functional activity Standing balance-Leahy Scale: Good Standing balance comment: with ambulation               High Level Balance Comments: No loss of balance with ambulation and head turns R/L/up/down, increasing/decreasing speed, and turning and stopping             Pertinent Vitals/Pain Pain Assessment: No/denies pain  Vitals (HR and O2 on room air) stable and WFL throughout treatment session.    Home Living Family/patient expects to be discharged to:: Private residence Living Arrangements: Children (Pt's daughter and granddaughter) Available Help at Discharge: Family Type of Home: House Home Access: Stairs to enter Entrance Stairs-Rails: Right;Left;Can reach both Entrance Stairs-Number of Steps: 4 Home Layout: Two level;Able to live on main level with bedroom/bathroom (Sometimes sleeps on the sofa.) Home Equipment: None      Prior Function Level of Independence: Independent         Comments: Pt denies any falls in past 6 months.     Hand Dominance        Extremity/Trunk Assessment   Upper Extremity Assessment Upper Extremity Assessment: Generalized weakness    Lower Extremity Assessment Lower Extremity Assessment: Generalized weakness    Cervical / Trunk Assessment Cervical / Trunk Assessment: Normal  Communication   Communication: No difficulties  Cognition Arousal/Alertness: Awake/alert Behavior During Therapy: Flat affect Overall Cognitive Status: Within Functional Limits for tasks  assessed                      General Comments   Nursing cleared pt for participation in physical therapy.  Pt agreeable to PT session.    Exercises     Assessment/Plan    PT Assessment Patient needs continued PT services  PT Problem List Decreased strength;Decreased activity tolerance          PT Treatment Interventions Therapeutic exercise;Therapeutic activities;Patient/family education    PT Goals (Current goals can be found in the Care Plan section)  Acute Rehab PT Goals Patient Stated Goal: to go home PT Goal Formulation: With patient Time For Goal Achievement: 04/18/16 Potential to Achieve Goals: Good    Frequency Min 2X/week   Barriers to discharge        Co-evaluation               End of Session Equipment Utilized During Treatment: Gait belt Activity Tolerance: Patient tolerated treatment well Patient left: in bed;with call bell/phone within reach;with bed alarm set Nurse Communication: Mobility status;Precautions         Time: 1010-1025 PT Time Calculation (min) (ACUTE ONLY): 15 min   Charges:   PT Evaluation $PT Eval Low Complexity: 1 Procedure     PT G CodesHendricks Limes:        Sharlette Jansma 04/04/2016, 12:06 PM Hendricks LimesEmily Rumor Sun, PT [phone]

## 2016-04-04 NOTE — Discharge Summary (Addendum)
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 SECONDARY DIAGNOSIS:   Past Medical History:  Diagnosis Date  . Aneurysm (HCC)   . Arthritis   . CHF (congestive heart failure) (HCC)   . Diabetes mellitus without complication (HCC)   . Heart murmur    as a child- 'nothing to worry about'  . Sleep apnea    HOSPITAL COURSE:  This is a 66 year old male admitted for hyperglycemia.  1. Hyperglycemia and DM.  BS is better controlled. on sliding scale, he was on Levemir with NovoLog 8 units before meals. Changed to NovoLog 70/30, 30 units bid (home meds).  Increase to  32 units bid. Hemoglobin A1c 13.2.  2. Leukocytosis: Likely secondary to hyperglycemia induced demarcation. Differential of CBC is normal. The patient is afebrile without wounds and has a clear chest x-ray. Follow-up CBC.  3. Congestive heart failure: Chronic; systolic. Murmurs consistent with tricuspid and mitral stenosis are present.  hold losartan and torsemide due to renal failure. echocardiogram: Systolic function was normal. Theestimated ejection fraction was in the range of 60% to 65%. Continue Coreg.  4. Hypertension: Controlled; hold losartan until kidney function improves. Continue diltiazem  5. 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 Hold IV fluid and follow-up BMP per Dr. Thedore MinsSingh. Nephrology consult.Avoid nephrotoxic agents, hold losartan and torsemide. Renal function is improving.  6. Hyperlipidemia: Continue statin therapy 7. AAA: no abdominal pain or pulsatile abdominal masses. Stable at 3.4 cm last ultrasound.  Elevated bili. Abd US: Left lobe liver  1.4 cm hyperechoic structure may represent a Hemangioma. Follow-up PCP as outpatient.  Tobacco abuse. Smoking cessation was counseled for 4-695minutes, nicotine patch. DISCHARGE CONDITIONS:  Stable, discharge to home today. CONSULTS OBTAINED:  Treatment Team:  Lamont DowdySarath Kolluru, MD DRUG ALLERGIES:   Allergies  Allergen Reactions  . Sulfa Antibiotics Hives    Patient states he is not allergic to this medication   DISCHARGE MEDICATIONS:   Allergies as of 04/04/2016      Reactions   Sulfa Antibiotics Hives   Patient states he is not allergic to this medication      Medication List    STOP taking these medications   losartan 50 MG tablet Commonly known as:  COZAAR   torsemide 100 MG tablet Commonly known as:  DEMADEX     TAKE these medications   aspirin 81 MG tablet Take 81 mg by mouth daily.   carvedilol 3.125 MG tablet Commonly known as:  COREG Take 3.125 mg by mouth 2 (two) times daily with a meal.   DILT-XR 120 MG 24 hr capsule Generic drug:  diltiazem Take 120 mg by mouth daily.   insulin lispro protamine-lispro (75-25) 100 UNIT/ML Susp injection Commonly known as:  HUMALOG 75/25 MIX Inject 32 Units into the skin 2 (two) times daily with a meal. 30 morning and 25 at bedtime What changed:  how much to take  when to take this   simvastatin 20 MG tablet Commonly known as:  ZOCOR Take 20 mg by mouth every evening.        DISCHARGE INSTRUCTIONS:  See AVS  If you experience  worsening of your admission symptoms, develop shortness of breath, life threatening emergency, suicidal or homicidal thoughts you must seek medical attention immediately by calling 911 or calling your MD immediately  if symptoms less severe.  You Must read complete instructions/literature along with all the possible adverse reactions/side effects for all the Medicines you take and that have been prescribed to you. Take any new Medicines after you have completely understood and accpet all  the possible adverse reactions/side effects.   Please note  You were cared for by a hospitalist during your hospital stay. If you have any questions about your discharge medications or the care you received while you were in the hospital after you are discharged, you can call the unit and asked to speak with the hospitalist on call if the hospitalist that took care of you is not available. Once you are discharged, your primary care physician will handle any further medical issues. Please note that NO REFILLS for any discharge medications will be authorized once you are discharged, as it is imperative that you return to your primary care physician (or establish a relationship with a primary care physician if you do not have one) for your aftercare needs so that they can reassess your need for medications and monitor your lab values.    On the day of Discharge:  VITAL SIGNS:  Blood pressure 131/69, pulse 75, temperature 98.5 F (36.9 C), temperature source Oral, resp. rate 18, height 5\' 8"  (1.727 m), weight 205 lb 6.4 oz (93.2 kg), SpO2 93 %. PHYSICAL EXAMINATION:  GENERAL:  66 y.o.-year-old patient lying in the bed with no acute distress.  EYES: Pupils equal, round, reactive to light and accommodation. No scleral icterus. Extraocular muscles intact.  HEENT: Head atraumatic, normocephalic. Oropharynx and nasopharynx clear.  NECK:  Supple, no jugular venous distention. No thyroid enlargement, no tenderness.  LUNGS: Normal breath sounds bilaterally, no wheezing, rales,rhonchi or crepitation. No use of accessory muscles of respiration.  CARDIOVASCULAR: S1, S2 normal. No murmurs, rubs, or gallops.  ABDOMEN: Soft, non-tender, non-distended. Bowel sounds present. No organomegaly or mass.  EXTREMITIES: No pedal edema, cyanosis, or clubbing.  NEUROLOGIC: Cranial nerves II through XII are intact. Muscle strength 5/5 in all extremities. Sensation intact. Gait not checked.  PSYCHIATRIC: The patient is alert  and oriented x 3.  SKIN: No obvious rash, lesion, or ulcer.  DATA REVIEW:   CBC  Recent Labs Lab 04/04/16 0414  WBC 13.9*  HGB 19.1*  HCT 57.2*  PLT 133*    Chemistries   Recent Labs Lab 04/02/16 0740  04/04/16 0414  NA 127*  < > 133*  K 4.4  < > 4.4  CL 83*  < > 96*  CO2 30  < > 26  GLUCOSE 573*  < > 194*  BUN 103*  < > 86*  CREATININE 3.07*  < > 1.88*  CALCIUM 9.7  < > 9.4  MG 2.4  --   --   AST  --   --  39  ALT  --   --  36  ALKPHOS  --   --  80  BILITOT  --   --  2.1*  < > = values in this interval not displayed.   Microbiology Results  Results for orders placed or performed during the hospital encounter of 04/02/16  CULTURE, BLOOD (ROUTINE X 2) w Reflex to ID Panel     Status: None (Preliminary result)   Collection Time: 04/02/16  1:02 AM  Result Value  Ref Range Status   Specimen Description BLOOD LEFT ANTECUBITAL  Final   Special Requests   Final    BOTTLES DRAWN AEROBIC AND ANAEROBIC AER 11CC,ANA 10CC   Culture NO GROWTH 2 DAYS  Final   Report Status PENDING  Incomplete  CULTURE, BLOOD (ROUTINE X 2) w Reflex to ID Panel     Status: None (Preliminary result)   Collection Time: 04/02/16  1:02 AM  Result Value Ref Range Status   Specimen Description BLOOD RIGHT ANTECUBITAL  Final   Special Requests   Final    BOTTLES DRAWN AEROBIC AND ANAEROBIC AER 9CC,ANA 11CC   Culture NO GROWTH 2 DAYS  Final   Report Status PENDING  Incomplete    RADIOLOGY:  Ct Head Wo Contrast  Result Date: 04/03/2016 CLINICAL DATA:  Confusion. EXAM: CT HEAD WITHOUT CONTRAST TECHNIQUE: Contiguous axial images were obtained from the base of the skull through the vertex without intravenous contrast. COMPARISON:  None of the head. Exam from 05/13/2008 labeled CT head is actually of the abdomen and pelvis. FINDINGS: BRAIN: No intraparenchymal hemorrhage, mass effect nor midline shift. Moderate degree of periventricular and subcortical white matter hypodensities consistent with small  vessel ischemic disease are identified. No acute large vascular territory infarcts. No abnormal extra-axial fluid collections. Basal cisterns are not effaced and midline. VASCULAR: Moderate calcific atherosclerosis of the carotid siphons. SKULL: No skull fracture. No significant scalp soft tissue swelling. SINUSES/ORBITS: The mastoid air-cells are clear. The included paranasal sinuses are well-aerated.The included ocular globes and orbital contents are non-suspicious. OTHER: None. IMPRESSION: Moderate degree of periventricular white matter small vessel ischemia likely chronic given moderate calcifications seen at the carotid siphons. No acute intracranial hemorrhage, large vascular territory infarction or extra-axial fluid collection. No focal mass. Electronically Signed   By: Tollie Eth M.D.   On: 04/03/2016 20:28     Management plans discussed with the patient, family and they are in agreement.  CODE STATUS: Full Code   TOTAL TIME TAKING CARE OF THIS PATIENT: 32 minutes.    Shaune Pollack M.D on 04/04/2016 at 3:57 PM  Between 7am to 6pm - Pager - [phone]  After 6pm go to [email or web address] - Social research officer, government  [facility] [place] [facility]  Office  [phone]  CC: Primary care physician; Jeremy Kaplan, MD   Note: This dictation was prepared with Dragon dictation along with smaller phrase technology. Any transcriptional errors that result from this process are unintentional.

## 2016-04-04 NOTE — Progress Notes (Signed)
Inpatient Diabetes Program Recommendations  AACE/ADA: New Consensus Statement on Inpatient Glycemic Control (2015)  Target Ranges:  Prepandial:   less than 140 mg/dL      Peak postprandial:   less than 180 mg/dL (1-2 hours)      Critically ill patients:  140 - 180 mg/dL   Results for Jeremy Gutierrez, Jeremy Gutierrez (MRN [id number]) as of 04/04/2016 13:58  Ref. Range 04/03/2016 07:34 04/03/2016 11:38 04/03/2016 16:21 04/03/2016 21:07  Glucose-Capillary Latest Ref Range: 65 - 99 mg/dL 409164 (H) 811317 (H) 914286 (H) 94   Results for Jeremy Gutierrez, Jeremy Gutierrez (MRN [id number]) as of 04/04/2016 13:58  Ref. Range 04/04/2016 07:35 04/04/2016 11:06  Glucose-Capillary Latest Ref Range: 65 - 99 mg/dL 086267 (H) 578239 (H)    Home DM Meds: Humalog 75/25 Insulin- 30 units AM/ 25 units PM  Current Insulin Orders: 70/30 Insulin- 32 units BID                                       Novolog Resistant Correction Scale/ SSI (0-20 units) TID AC      -Note patient refused dose of 70/30 insulin yesterday AM.  Did not get 70/30 insulin this AM again.  -Note 70/30 insulin increased to 32 units BID today.   Spoke with patient about his current A1c of 13.2%.  Explained what an A1c is and what it measures.  Reminded patient that his goal A1c is 7% or less per ADA standards to prevent both acute and long-term complications.  Explained to patient the extreme importance of good glucose control at home.  Encouraged patient to check his/her CBGs at least bid at home (fasting and another check within the day) and to record all CBGs in a logbook for his PCP to review.  Patient told me he sees Dr. Maryellen PileEason for primary care/diabetes management.  Could not recall the date of his last visit with his PCP.  Encouraged pt to schedule follow-up visit with his PCP soon after d/c.  Patient stated he did not have any questions for me about his DM care at home.      --Will follow patient during hospitalization--  Ambrose FinlandJeannine Johnston Maelyn Berrey RN, MSN, CDE Diabetes  Coordinator Inpatient Glycemic Control Team Team Pager: [phone] (8a-5p)

## 2016-04-04 NOTE — Progress Notes (Signed)
Patient just left with daughter. I explained the importance of keeping a check on his BS and taking his medications. Patient had no complaints or signs and symptoms of distress.

## 2016-04-04 NOTE — Progress Notes (Signed)
Subjective:   Doing fair today. Serum creatinine is down to 1.88 Some confusion issues overnight. CT of the head was negative Urine output recorded at 675 cc Blood sugar control is better  Objective:  Vital signs in last 24 hours:  Temp:  [98 F (36.7 C)-98.9 F (37.2 C)] 98.9 F (37.2 C) (02/13 0736) Pulse Rate:  [66-87] 81 (02/13 0736) Resp:  [18-19] 18 (02/13 0736) BP: (109-143)/(60-81) 132/69 (02/13 0736) SpO2:  [96 %-99 %] 99 % (02/13 0736) Weight:  [93.2 kg (205 lb 6.4 oz)] 93.2 kg (205 lb 6.4 oz) (02/13 0500)  Weight change: -0.998 kg (-2 lb 3.2 oz) Filed Weights   04/02/16 1252 04/03/16 0436 04/04/16 0500  Weight: 94.2 kg (207 lb 9.6 oz) 94.4 kg (208 lb 3.2 oz) 93.2 kg (205 lb 6.4 oz)    Intake/Output:    Intake/Output Summary (Last 24 hours) at 04/04/16 1233 Last data filed at 04/04/16 0211  Gross per 24 hour  Intake              120 ml  Output              500 ml  Net             -380 ml     Physical Exam: General: No acute distress, sitting up in bed   HEENT Anicteric, moist oral mucous membranes   Neck Supple, no JVD   Pulm/lungs Normal breathing effort, coarse breath sounds   CVS/Heart No rub or gallop   Abdomen:  Soft nontender   Extremities: No significant edema   Neurologic: Alert, able to answer questions   Skin: No acute rashes           Basic Metabolic Panel:   Recent Labs Lab 04/02/16 0102 04/02/16 0740 04/03/16 0330 04/04/16 0414  NA 125* 127* 132* 133*  K 4.9 4.4 4.0 4.4  CL 81* 83* 90* 96*  CO2 31 30 32 26  GLUCOSE 706* 573* 142* 194*  BUN 96* 103* 96* 86*  CREATININE 3.00* 3.07* 2.58* 1.88*  CALCIUM 9.4 9.7 9.5 9.4  MG  --  2.4  --   --      CBC:  Recent Labs Lab 04/02/16 0102 04/03/16 0330 04/04/16 0414  WBC 13.3* 15.9* 13.9*  NEUTROABS 8.4*  --   --   HGB 19.9* 19.6* 19.1*  HCT 60.9* 58.7* 57.2*  MCV 95.3 92.9 93.1  PLT 148* 139* 133*      Microbiology:  Recent Results (from the past 720 hour(s))   CULTURE, BLOOD (ROUTINE X 2) w Reflex to ID Panel     Status: None (Preliminary result)   Collection Time: 04/02/16  1:02 AM  Result Value Ref Range Status   Specimen Description BLOOD LEFT ANTECUBITAL  Final   Special Requests   Final    BOTTLES DRAWN AEROBIC AND ANAEROBIC AER 11CC,ANA 10CC   Culture NO GROWTH 2 DAYS  Final   Report Status PENDING  Incomplete  CULTURE, BLOOD (ROUTINE X 2) w Reflex to ID Panel     Status: None (Preliminary result)   Collection Time: 04/02/16  1:02 AM  Result Value Ref Range Status   Specimen Description BLOOD RIGHT ANTECUBITAL  Final   Special Requests   Final    BOTTLES DRAWN AEROBIC AND ANAEROBIC AER 9CC,ANA 11CC   Culture NO GROWTH 2 DAYS  Final   Report Status PENDING  Incomplete    Coagulation Studies: No results for input(s): LABPROT, INR in  the last 72 hours.  Urinalysis:  Recent Labs  04/02/16 0102  COLORURINE YELLOW*  LABSPEC 1.015  PHURINE 5.0  GLUCOSEU >=500*  HGBUR NEGATIVE  BILIRUBINUR NEGATIVE  KETONESUR NEGATIVE  PROTEINUR 100*  NITRITE NEGATIVE  LEUKOCYTESUR NEGATIVE      Imaging: Ct Head Wo Contrast  Result Date: 04/03/2016 CLINICAL DATA:  Confusion. EXAM: CT HEAD WITHOUT CONTRAST TECHNIQUE: Contiguous axial images were obtained from the base of the skull through the vertex without intravenous contrast. COMPARISON:  None of the head. Exam from 05/13/2008 labeled CT head is actually of the abdomen and pelvis. FINDINGS: BRAIN: No intraparenchymal hemorrhage, mass effect nor midline shift. Moderate degree of periventricular and subcortical white matter hypodensities consistent with small vessel ischemic disease are identified. No acute large vascular territory infarcts. No abnormal extra-axial fluid collections. Basal cisterns are not effaced and midline. VASCULAR: Moderate calcific atherosclerosis of the carotid siphons. SKULL: No skull fracture. No significant scalp soft tissue swelling. SINUSES/ORBITS: The mastoid  air-cells are clear. The included paranasal sinuses are well-aerated.The included ocular globes and orbital contents are non-suspicious. OTHER: None. IMPRESSION: Moderate degree of periventricular white matter small vessel ischemia likely chronic given moderate calcifications seen at the carotid siphons. No acute intracranial hemorrhage, large vascular territory infarction or extra-axial fluid collection. No focal mass. Electronically Signed   By: Tollie Ethavid  Kwon M.D.   On: 04/03/2016 20:28     Medications:    . aspirin  81 mg Oral Daily  . carvedilol  3.125 mg Oral BID WC  . diltiazem  120 mg Oral Daily  . docusate sodium  100 mg Oral BID  . enoxaparin (LOVENOX) injection  40 mg Subcutaneous Q24H  . insulin aspart  0-20 Units Subcutaneous TID WC  . insulin aspart protamine- aspart  32 Units Subcutaneous BID WC  . mouth rinse  15 mL Mouth Rinse BID  . nicotine  21 mg Transdermal Daily  . simvastatin  10 mg Oral QPM   acetaminophen **OR** acetaminophen, ondansetron **OR** ondansetron (ZOFRAN) IV  Assessment/ Plan:  66 y.o. African American male  with long-standing diabetes, insulin dependent, congestive heart failure, sleep apnea, Coronary disease, hyperlipidemia, hypertension, peripheral vascular disease, pulmonary hypertension, tobacco abuse, proteinuria  1. Acute renal failure on chronic kidney disease stage III  Baseline creatinine 1.3 from January 2018 Acute renal failure is likely secondary to overdiuresis from outpatient administration of diuretics. At present, volume status appears to have stabilized. Continue to hold further diuretics for now. Discontinue maintenance IV normal saline. Continue Na restriction to 2 g per day Monitor labs on a daily basis Serum creatinine is improving  2. Diabetes type 2 with chronic kidney disease Hemoglobin A1c 13.2% Counseled patient regarding better control of blood sugars  3. Proteinuria Outpatient urine protein/creatinine ratio was 1.2  g Patient gets losartan as outpatient. Currently it is on hold due to renal insufficiency. May restart at the time of discharge.    LOS: 2 Jessy Calixte 2/13/201812:33 PM

## 2016-04-04 NOTE — Care Management Important Message (Signed)
Important Message  Patient Details  Name: Jeremy Gutierrez MRN: [id number] Date of Birth: 05/28/1950   Medicare Important Message Given:  Yes    Marily MemosLisa M Eriyana Sweeten, RN 04/04/2016, 11:26 AM

## 2016-04-06 DIAGNOSIS — I5022 Chronic systolic (congestive) heart failure: Secondary | ICD-10-CM | POA: Diagnosis not present

## 2016-04-06 DIAGNOSIS — I270 Primary pulmonary hypertension: Secondary | ICD-10-CM | POA: Diagnosis not present

## 2016-04-06 DIAGNOSIS — G4733 Obstructive sleep apnea (adult) (pediatric): Secondary | ICD-10-CM | POA: Diagnosis not present

## 2016-04-06 DIAGNOSIS — E1165 Type 2 diabetes mellitus with hyperglycemia: Secondary | ICD-10-CM | POA: Diagnosis not present

## 2016-04-07 LAB — CULTURE, BLOOD (ROUTINE X 2)
Culture: NO GROWTH
Culture: NO GROWTH

## 2016-04-10 DIAGNOSIS — Z1159 Encounter for screening for other viral diseases: Secondary | ICD-10-CM | POA: Diagnosis not present

## 2016-04-10 DIAGNOSIS — E119 Type 2 diabetes mellitus without complications: Secondary | ICD-10-CM | POA: Diagnosis not present

## 2016-04-10 DIAGNOSIS — L732 Hidradenitis suppurativa: Secondary | ICD-10-CM | POA: Diagnosis not present

## 2016-04-10 DIAGNOSIS — Z79899 Other long term (current) drug therapy: Secondary | ICD-10-CM | POA: Diagnosis not present

## 2016-05-08 DIAGNOSIS — R600 Localized edema: Secondary | ICD-10-CM | POA: Diagnosis not present

## 2016-05-08 DIAGNOSIS — I5042 Chronic combined systolic (congestive) and diastolic (congestive) heart failure: Secondary | ICD-10-CM | POA: Diagnosis not present

## 2016-05-08 DIAGNOSIS — I208 Other forms of angina pectoris: Secondary | ICD-10-CM | POA: Diagnosis not present

## 2016-05-08 DIAGNOSIS — I429 Cardiomyopathy, unspecified: Secondary | ICD-10-CM | POA: Diagnosis not present

## 2016-05-08 DIAGNOSIS — I2510 Atherosclerotic heart disease of native coronary artery without angina pectoris: Secondary | ICD-10-CM | POA: Diagnosis not present

## 2016-05-08 DIAGNOSIS — R011 Cardiac murmur, unspecified: Secondary | ICD-10-CM | POA: Diagnosis not present

## 2016-05-08 DIAGNOSIS — R0602 Shortness of breath: Secondary | ICD-10-CM | POA: Diagnosis not present

## 2016-05-08 DIAGNOSIS — I272 Pulmonary hypertension, unspecified: Secondary | ICD-10-CM | POA: Diagnosis not present

## 2016-05-24 DIAGNOSIS — Z79899 Other long term (current) drug therapy: Secondary | ICD-10-CM | POA: Diagnosis not present

## 2016-05-24 DIAGNOSIS — L819 Disorder of pigmentation, unspecified: Secondary | ICD-10-CM | POA: Diagnosis not present

## 2016-05-24 DIAGNOSIS — L720 Epidermal cyst: Secondary | ICD-10-CM | POA: Diagnosis not present

## 2016-05-24 DIAGNOSIS — L280 Lichen simplex chronicus: Secondary | ICD-10-CM | POA: Diagnosis not present

## 2016-05-24 DIAGNOSIS — L732 Hidradenitis suppurativa: Secondary | ICD-10-CM | POA: Diagnosis not present

## 2016-05-30 DIAGNOSIS — R0602 Shortness of breath: Secondary | ICD-10-CM | POA: Diagnosis not present

## 2016-05-30 DIAGNOSIS — G4733 Obstructive sleep apnea (adult) (pediatric): Secondary | ICD-10-CM | POA: Diagnosis not present

## 2016-05-30 DIAGNOSIS — F17210 Nicotine dependence, cigarettes, uncomplicated: Secondary | ICD-10-CM | POA: Diagnosis not present

## 2016-06-19 DIAGNOSIS — E119 Type 2 diabetes mellitus without complications: Secondary | ICD-10-CM | POA: Diagnosis not present

## 2016-06-19 DIAGNOSIS — I5022 Chronic systolic (congestive) heart failure: Secondary | ICD-10-CM | POA: Diagnosis not present

## 2016-06-19 DIAGNOSIS — I270 Primary pulmonary hypertension: Secondary | ICD-10-CM | POA: Diagnosis not present

## 2016-06-26 DIAGNOSIS — Z794 Long term (current) use of insulin: Secondary | ICD-10-CM | POA: Diagnosis not present

## 2016-06-26 DIAGNOSIS — N3000 Acute cystitis without hematuria: Secondary | ICD-10-CM | POA: Diagnosis not present

## 2016-06-26 DIAGNOSIS — N183 Chronic kidney disease, stage 3 (moderate): Secondary | ICD-10-CM | POA: Diagnosis not present

## 2016-06-26 DIAGNOSIS — E1165 Type 2 diabetes mellitus with hyperglycemia: Secondary | ICD-10-CM | POA: Diagnosis not present

## 2016-06-26 DIAGNOSIS — G4733 Obstructive sleep apnea (adult) (pediatric): Secondary | ICD-10-CM | POA: Diagnosis not present

## 2016-06-26 DIAGNOSIS — I5022 Chronic systolic (congestive) heart failure: Secondary | ICD-10-CM | POA: Diagnosis not present

## 2016-06-26 DIAGNOSIS — K219 Gastro-esophageal reflux disease without esophagitis: Secondary | ICD-10-CM | POA: Diagnosis not present

## 2016-07-04 DIAGNOSIS — E1165 Type 2 diabetes mellitus with hyperglycemia: Secondary | ICD-10-CM | POA: Diagnosis not present

## 2016-07-04 DIAGNOSIS — Z794 Long term (current) use of insulin: Secondary | ICD-10-CM | POA: Diagnosis not present

## 2016-07-25 DIAGNOSIS — I110 Hypertensive heart disease with heart failure: Secondary | ICD-10-CM | POA: Diagnosis not present

## 2016-07-25 DIAGNOSIS — E1165 Type 2 diabetes mellitus with hyperglycemia: Secondary | ICD-10-CM | POA: Diagnosis not present

## 2016-07-25 DIAGNOSIS — Z794 Long term (current) use of insulin: Secondary | ICD-10-CM | POA: Diagnosis not present

## 2016-07-26 DIAGNOSIS — G4733 Obstructive sleep apnea (adult) (pediatric): Secondary | ICD-10-CM | POA: Diagnosis not present

## 2016-08-10 DIAGNOSIS — E113213 Type 2 diabetes mellitus with mild nonproliferative diabetic retinopathy with macular edema, bilateral: Secondary | ICD-10-CM | POA: Diagnosis not present

## 2016-08-10 DIAGNOSIS — E119 Type 2 diabetes mellitus without complications: Secondary | ICD-10-CM | POA: Diagnosis not present

## 2016-08-10 DIAGNOSIS — Z7984 Long term (current) use of oral hypoglycemic drugs: Secondary | ICD-10-CM | POA: Diagnosis not present

## 2016-08-10 DIAGNOSIS — I2510 Atherosclerotic heart disease of native coronary artery without angina pectoris: Secondary | ICD-10-CM | POA: Diagnosis not present

## 2016-08-22 DIAGNOSIS — L309 Dermatitis, unspecified: Secondary | ICD-10-CM | POA: Diagnosis not present

## 2016-09-11 DIAGNOSIS — I272 Pulmonary hypertension, unspecified: Secondary | ICD-10-CM | POA: Diagnosis not present

## 2016-09-11 DIAGNOSIS — Z87891 Personal history of nicotine dependence: Secondary | ICD-10-CM | POA: Diagnosis not present

## 2016-09-11 DIAGNOSIS — I2510 Atherosclerotic heart disease of native coronary artery without angina pectoris: Secondary | ICD-10-CM | POA: Diagnosis not present

## 2016-09-11 DIAGNOSIS — E119 Type 2 diabetes mellitus without complications: Secondary | ICD-10-CM | POA: Diagnosis not present

## 2016-09-14 DIAGNOSIS — E113313 Type 2 diabetes mellitus with moderate nonproliferative diabetic retinopathy with macular edema, bilateral: Secondary | ICD-10-CM | POA: Diagnosis not present

## 2016-09-14 DIAGNOSIS — E113311 Type 2 diabetes mellitus with moderate nonproliferative diabetic retinopathy with macular edema, right eye: Secondary | ICD-10-CM | POA: Diagnosis not present

## 2016-09-18 DIAGNOSIS — I2510 Atherosclerotic heart disease of native coronary artery without angina pectoris: Secondary | ICD-10-CM | POA: Diagnosis not present

## 2016-09-21 DIAGNOSIS — Z79899 Other long term (current) drug therapy: Secondary | ICD-10-CM | POA: Diagnosis not present

## 2016-09-21 DIAGNOSIS — L732 Hidradenitis suppurativa: Secondary | ICD-10-CM | POA: Diagnosis not present

## 2016-09-21 DIAGNOSIS — L720 Epidermal cyst: Secondary | ICD-10-CM | POA: Diagnosis not present

## 2016-09-21 DIAGNOSIS — L280 Lichen simplex chronicus: Secondary | ICD-10-CM | POA: Diagnosis not present

## 2016-09-21 DIAGNOSIS — L819 Disorder of pigmentation, unspecified: Secondary | ICD-10-CM | POA: Diagnosis not present

## 2016-09-26 DIAGNOSIS — N183 Chronic kidney disease, stage 3 (moderate): Secondary | ICD-10-CM | POA: Diagnosis not present

## 2016-09-26 DIAGNOSIS — R809 Proteinuria, unspecified: Secondary | ICD-10-CM | POA: Diagnosis not present

## 2016-09-26 DIAGNOSIS — E1122 Type 2 diabetes mellitus with diabetic chronic kidney disease: Secondary | ICD-10-CM | POA: Diagnosis not present

## 2016-09-26 DIAGNOSIS — N2581 Secondary hyperparathyroidism of renal origin: Secondary | ICD-10-CM | POA: Diagnosis not present

## 2016-09-26 DIAGNOSIS — I129 Hypertensive chronic kidney disease with stage 1 through stage 4 chronic kidney disease, or unspecified chronic kidney disease: Secondary | ICD-10-CM | POA: Diagnosis not present

## 2016-09-26 DIAGNOSIS — N179 Acute kidney failure, unspecified: Secondary | ICD-10-CM | POA: Diagnosis not present

## 2016-10-18 DIAGNOSIS — E113313 Type 2 diabetes mellitus with moderate nonproliferative diabetic retinopathy with macular edema, bilateral: Secondary | ICD-10-CM | POA: Diagnosis not present

## 2016-10-26 DIAGNOSIS — I2510 Atherosclerotic heart disease of native coronary artery without angina pectoris: Secondary | ICD-10-CM | POA: Diagnosis not present

## 2016-10-26 DIAGNOSIS — G4733 Obstructive sleep apnea (adult) (pediatric): Secondary | ICD-10-CM | POA: Diagnosis not present

## 2016-10-26 DIAGNOSIS — I2781 Cor pulmonale (chronic): Secondary | ICD-10-CM | POA: Diagnosis not present

## 2016-10-26 DIAGNOSIS — N183 Chronic kidney disease, stage 3 (moderate): Secondary | ICD-10-CM | POA: Diagnosis not present

## 2016-10-26 DIAGNOSIS — E1122 Type 2 diabetes mellitus with diabetic chronic kidney disease: Secondary | ICD-10-CM | POA: Diagnosis not present

## 2016-11-08 DIAGNOSIS — R011 Cardiac murmur, unspecified: Secondary | ICD-10-CM | POA: Diagnosis not present

## 2016-11-08 DIAGNOSIS — R0602 Shortness of breath: Secondary | ICD-10-CM | POA: Diagnosis not present

## 2016-11-08 DIAGNOSIS — N289 Disorder of kidney and ureter, unspecified: Secondary | ICD-10-CM | POA: Diagnosis not present

## 2016-11-08 DIAGNOSIS — I208 Other forms of angina pectoris: Secondary | ICD-10-CM | POA: Diagnosis not present

## 2016-11-08 DIAGNOSIS — I739 Peripheral vascular disease, unspecified: Secondary | ICD-10-CM | POA: Diagnosis not present

## 2016-11-08 DIAGNOSIS — I429 Cardiomyopathy, unspecified: Secondary | ICD-10-CM | POA: Diagnosis not present

## 2016-11-08 DIAGNOSIS — I272 Pulmonary hypertension, unspecified: Secondary | ICD-10-CM | POA: Diagnosis not present

## 2016-11-08 DIAGNOSIS — Z8679 Personal history of other diseases of the circulatory system: Secondary | ICD-10-CM | POA: Diagnosis not present

## 2016-11-08 DIAGNOSIS — I2510 Atherosclerotic heart disease of native coronary artery without angina pectoris: Secondary | ICD-10-CM | POA: Diagnosis not present

## 2016-11-08 DIAGNOSIS — R600 Localized edema: Secondary | ICD-10-CM | POA: Diagnosis not present

## 2016-11-08 DIAGNOSIS — G4733 Obstructive sleep apnea (adult) (pediatric): Secondary | ICD-10-CM | POA: Diagnosis not present

## 2016-11-08 DIAGNOSIS — I5042 Chronic combined systolic (congestive) and diastolic (congestive) heart failure: Secondary | ICD-10-CM | POA: Diagnosis not present

## 2016-11-15 DIAGNOSIS — L02424 Furuncle of left upper limb: Secondary | ICD-10-CM | POA: Diagnosis not present

## 2016-11-27 DIAGNOSIS — I2510 Atherosclerotic heart disease of native coronary artery without angina pectoris: Secondary | ICD-10-CM | POA: Diagnosis not present

## 2016-11-27 DIAGNOSIS — R0602 Shortness of breath: Secondary | ICD-10-CM | POA: Diagnosis not present

## 2016-11-29 DIAGNOSIS — I5042 Chronic combined systolic (congestive) and diastolic (congestive) heart failure: Secondary | ICD-10-CM | POA: Diagnosis not present

## 2016-11-29 DIAGNOSIS — E119 Type 2 diabetes mellitus without complications: Secondary | ICD-10-CM | POA: Diagnosis not present

## 2016-11-29 DIAGNOSIS — I2510 Atherosclerotic heart disease of native coronary artery without angina pectoris: Secondary | ICD-10-CM | POA: Diagnosis not present

## 2016-11-29 DIAGNOSIS — R011 Cardiac murmur, unspecified: Secondary | ICD-10-CM | POA: Diagnosis not present

## 2016-11-29 DIAGNOSIS — I10 Essential (primary) hypertension: Secondary | ICD-10-CM | POA: Diagnosis not present

## 2016-11-29 DIAGNOSIS — R600 Localized edema: Secondary | ICD-10-CM | POA: Diagnosis not present

## 2016-11-29 DIAGNOSIS — I272 Pulmonary hypertension, unspecified: Secondary | ICD-10-CM | POA: Diagnosis not present

## 2016-11-29 DIAGNOSIS — E782 Mixed hyperlipidemia: Secondary | ICD-10-CM | POA: Diagnosis not present

## 2016-11-29 DIAGNOSIS — R0602 Shortness of breath: Secondary | ICD-10-CM | POA: Diagnosis not present

## 2016-11-29 DIAGNOSIS — F17200 Nicotine dependence, unspecified, uncomplicated: Secondary | ICD-10-CM | POA: Diagnosis not present

## 2016-11-29 DIAGNOSIS — G4733 Obstructive sleep apnea (adult) (pediatric): Secondary | ICD-10-CM | POA: Diagnosis not present

## 2016-12-23 DIAGNOSIS — F17210 Nicotine dependence, cigarettes, uncomplicated: Secondary | ICD-10-CM | POA: Diagnosis not present

## 2016-12-23 DIAGNOSIS — Z959 Presence of cardiac and vascular implant and graft, unspecified: Secondary | ICD-10-CM | POA: Diagnosis not present

## 2016-12-23 DIAGNOSIS — I10 Essential (primary) hypertension: Secondary | ICD-10-CM | POA: Diagnosis not present

## 2016-12-23 DIAGNOSIS — E119 Type 2 diabetes mellitus without complications: Secondary | ICD-10-CM | POA: Diagnosis not present

## 2016-12-23 DIAGNOSIS — Z794 Long term (current) use of insulin: Secondary | ICD-10-CM | POA: Diagnosis not present

## 2016-12-23 DIAGNOSIS — J069 Acute upper respiratory infection, unspecified: Secondary | ICD-10-CM | POA: Diagnosis not present

## 2016-12-23 DIAGNOSIS — Z7982 Long term (current) use of aspirin: Secondary | ICD-10-CM | POA: Diagnosis not present

## 2017-01-02 DIAGNOSIS — I10 Essential (primary) hypertension: Secondary | ICD-10-CM | POA: Diagnosis not present

## 2017-01-02 DIAGNOSIS — Z79899 Other long term (current) drug therapy: Secondary | ICD-10-CM | POA: Diagnosis not present

## 2017-01-02 DIAGNOSIS — F17210 Nicotine dependence, cigarettes, uncomplicated: Secondary | ICD-10-CM | POA: Diagnosis not present

## 2017-01-02 DIAGNOSIS — Z794 Long term (current) use of insulin: Secondary | ICD-10-CM | POA: Diagnosis not present

## 2017-01-02 DIAGNOSIS — J069 Acute upper respiratory infection, unspecified: Secondary | ICD-10-CM | POA: Diagnosis not present

## 2017-01-02 DIAGNOSIS — Z7982 Long term (current) use of aspirin: Secondary | ICD-10-CM | POA: Diagnosis not present

## 2017-01-02 DIAGNOSIS — E119 Type 2 diabetes mellitus without complications: Secondary | ICD-10-CM | POA: Diagnosis not present

## 2017-01-16 DIAGNOSIS — N390 Urinary tract infection, site not specified: Secondary | ICD-10-CM | POA: Diagnosis not present

## 2017-01-16 DIAGNOSIS — R300 Dysuria: Secondary | ICD-10-CM | POA: Diagnosis not present

## 2017-01-22 DIAGNOSIS — E113313 Type 2 diabetes mellitus with moderate nonproliferative diabetic retinopathy with macular edema, bilateral: Secondary | ICD-10-CM | POA: Diagnosis not present

## 2017-01-22 DIAGNOSIS — E113312 Type 2 diabetes mellitus with moderate nonproliferative diabetic retinopathy with macular edema, left eye: Secondary | ICD-10-CM | POA: Diagnosis not present

## 2017-01-23 ENCOUNTER — Ambulatory Visit (INDEPENDENT_AMBULATORY_CARE_PROVIDER_SITE_OTHER): Payer: Medicare Other | Admitting: Vascular Surgery

## 2017-01-23 ENCOUNTER — Ambulatory Visit (INDEPENDENT_AMBULATORY_CARE_PROVIDER_SITE_OTHER): Payer: Medicare Other

## 2017-01-23 ENCOUNTER — Encounter (INDEPENDENT_AMBULATORY_CARE_PROVIDER_SITE_OTHER): Payer: Self-pay | Admitting: Vascular Surgery

## 2017-01-23 VITALS — BP 128/76 | HR 97 | Resp 18 | Ht 68.0 in | Wt 231.0 lb

## 2017-01-23 DIAGNOSIS — F17210 Nicotine dependence, cigarettes, uncomplicated: Secondary | ICD-10-CM | POA: Diagnosis not present

## 2017-01-23 DIAGNOSIS — E118 Type 2 diabetes mellitus with unspecified complications: Secondary | ICD-10-CM

## 2017-01-23 DIAGNOSIS — I10 Essential (primary) hypertension: Secondary | ICD-10-CM | POA: Diagnosis not present

## 2017-01-23 DIAGNOSIS — I714 Abdominal aortic aneurysm, without rupture, unspecified: Secondary | ICD-10-CM

## 2017-01-23 DIAGNOSIS — F17200 Nicotine dependence, unspecified, uncomplicated: Secondary | ICD-10-CM | POA: Diagnosis not present

## 2017-01-23 DIAGNOSIS — Z794 Long term (current) use of insulin: Secondary | ICD-10-CM

## 2017-01-23 NOTE — Assessment & Plan Note (Signed)
blood glucose control important in reducing the progression of atherosclerotic disease. Also, involved in wound healing. On appropriate medications.  

## 2017-01-23 NOTE — Assessment & Plan Note (Signed)
His aortic duplex today shows slight increase in his aortic diameter which now measures 3.6 cm in maximal diameter previously this was 3.5 cm. We will continue to follow this on an annual basis.  Smoking cessation and continuing to control his blood pressure were discussed.

## 2017-01-23 NOTE — Patient Instructions (Signed)
Abdominal Aortic Aneurysm Blood pumps away from the heart through tubes (blood vessels) called arteries. Aneurysms are weak or damaged places in the wall of an artery. It bulges out like a balloon. An abdominal aortic aneurysm happens in the main artery of the body (aorta). It can burst or tear, causing bleeding inside the body. This is an emergency. It needs treatment right away. What are the causes? The exact cause is unknown. Things that could cause this problem include:  Fat and other substances building up in the lining of a tube.  Swelling of the walls of a blood vessel.  Certain tissue diseases.  Belly (abdominal) trauma.  An infection in the main artery of the body.  What increases the risk? There are things that make it more likely for you to have an aneurysm. These include:  Being over the age of 68 years old.  Having high blood pressure (hypertension).  Being a male.  Being white.  Being very overweight (obese).  Having a family history of aneurysm.  Using tobacco products.  What are the signs or symptoms? Symptoms depend on the size of the aneurysm and how fast it grows. There may not be symptoms. If symptoms occur, they can include:  Pain (belly, side, lower back, or groin).  Feeling full after eating a small amount of food.  Feeling sick to your stomach (nauseous), throwing up (vomiting), or both.  Feeling a lump in your belly that feels like it is beating (pulsating).  Feeling like you will pass out (faint).  How is this treated?  Medicine to control blood pressure and pain.  Imaging tests to see if the aneurysm gets bigger.  Surgery. How is this prevented? To lessen your chance of getting this condition:  Stop smoking. Stop chewing tobacco.  Limit or avoid alcohol.  Keep your blood pressure, blood sugar, and cholesterol within normal limits.  Eat less salt.  Eat foods low in saturated fats and cholesterol. These are found in animal and  whole dairy products.  Eat more fiber. Fiber is found in whole grains, vegetables, and fruits.  Keep a healthy weight.  Stay active and exercise often.  This information is not intended to replace advice given to you by your health care provider. Make sure you discuss any questions you have with your health care provider. Document Released: 06/03/2012 Document Revised: 07/15/2015 Document Reviewed: 03/08/2012 Elsevier Interactive Patient Education  2017 [employer]  

## 2017-01-23 NOTE — Progress Notes (Signed)
MRN : [id number]  Jeremy Gutierrez is a 66 y.o. (1950-10-28) male who presents with chief complaint of  Chief Complaint  Patient presents with  . AAA    1 year f/u  .  History of Present Illness: Patient returns today in follow up of AAA.  He is doing well.  He has had some upper respiratory issues but otherwise has done well.  No other complaints today.  Denies any aneurysm related symptoms. Specifically, the patient denies new back or abdominal pain, or signs of peripheral embolization.  He does continue to smoke.  His aortic duplex today shows slight increase in his aortic diameter which now measures 3.6 cm in maximal diameter previously this was 3.5 cm.  Current Outpatient Medications  Medication Sig Dispense Refill  . aspirin 81 MG tablet Take 81 mg by mouth daily.    Marland Kitchen azelastine (ASTELIN) 0.1 % nasal spray     . benzonatate (TESSALON) 100 MG capsule TK ONE C PO  TID PRN  0  . brompheniramine-pseudoephedrine-DM 30-2-10 MG/5ML syrup     . carvedilol (COREG) 3.125 MG tablet Take 3.125 mg by mouth 2 (two) times daily with a meal.    . DILT-XR 120 MG 24 hr capsule Take 120 mg by mouth daily.     Marland Kitchen doxycycline (VIBRAMYCIN) 100 MG capsule Take 100 mg by mouth 2 (two) times daily.  0  . fluticasone (FLONASE) 50 MCG/ACT nasal spray SPR ONCE IEN  D  0  . HUMULIN 70/30 KWIKPEN (70-30) 100 UNIT/ML PEN     . hydrOXYzine (ATARAX/VISTARIL) 25 MG tablet TAKE 1 TO 2 TABLETS BY MOUTH AT BEDTIME IF NEEDED FOR ITCHING  0  . insulin lispro protamine-lispro (HUMALOG 75/25 MIX) (75-25) 100 UNIT/ML SUSP injection Inject 32 Units into the skin 2 (two) times daily with a meal. 30 morning and 25 at bedtime 10 mL 2  . montelukast (SINGULAIR) 10 MG tablet TK 1 T PO  D  0  . nitrofurantoin, macrocrystal-monohydrate, (MACROBID) 100 MG capsule take 1 capsule by mouth every 12 hours with food  0  . ONE TOUCH ULTRA TEST test strip     . sacubitril-valsartan (ENTRESTO) 24-26 MG Take by mouth.    . simvastatin  (ZOCOR) 20 MG tablet Take 20 mg by mouth every evening.    . torsemide (DEMADEX) 100 MG tablet      No current facility-administered medications for this visit.     Past Medical History:  Diagnosis Date  . Aneurysm (HCC)   . Arthritis   . CHF (congestive heart failure) (HCC)   . Diabetes mellitus without complication (HCC)   . Heart murmur    as a child- 'nothing to worry about'  . Sleep apnea     Past Surgical History:  Procedure Laterality Date  . BUNIONECTOMY Right   . CARDIAC CATHETERIZATION  2010  . CATARACT EXTRACTION W/PHACO Right 06/05/2012   Procedure: CATARACT EXTRACTION PHACO AND INTRAOCULAR LENS PLACEMENT (IOC);  Surgeon: Shade Flood, MD;  Location: [facility] OR;  Service: Ophthalmology;  Laterality: Right;  . Colonscopy  2012    Social History Social History   Tobacco Use  . Smoking status: Current Every Day Smoker    Packs/day: 0.50    Years: 40.00    Pack years: 20.00    Types: Cigarettes    Last attempt to quit: 06/02/2012    Years since quitting: 4.6  . Smokeless tobacco: Never Used  . Tobacco comment: smoked for many years.  Substance Use Topics  . Alcohol use: Yes    Comment: social  . Drug use: No     Family History Family History  Problem Relation Age of Onset  . CAD Mother   . Cancer Sister      Allergies  Allergen Reactions  . Sulfa Antibiotics Hives    Patient states he is not allergic to this medication      REVIEW OF SYSTEMS (Negative unless checked)  Constitutional: [] Weight loss  [] Fever  [] Chills Cardiac: [] Chest pain   [] Chest pressure   [] Palpitations   [] Shortness of breath when laying flat   [] Shortness of breath at rest   [] Shortness of breath with exertion. Vascular:  [] Pain in legs with walking   [] Pain in legs at rest   [] Pain in legs when laying flat   [] Claudication   [] Pain in feet when walking  [] Pain in feet at rest  [] Pain in feet when laying flat   [] History of DVT   [] Phlebitis   [] Swelling in legs   [] Varicose  veins   [] Non-healing ulcers Pulmonary:   [] Uses home oxygen   [x] Productive cough   [] Hemoptysis   [] Wheeze  [] COPD   [] Asthma Neurologic:  [] Dizziness  [] Blackouts   [] Seizures   [] History of stroke   [] History of TIA  [] Aphasia   [] Temporary blindness   [] Dysphagia   [] Weakness or numbness in arms   [] Weakness or numbness in legs Musculoskeletal:  [] Arthritis   [] Joint swelling   [] Joint pain   [] Low back pain Hematologic:  [] Easy bruising  [] Easy bleeding   [] Hypercoagulable state   [] Anemic   Gastrointestinal:  [] Blood in stool   [] Vomiting blood  [] Gastroesophageal reflux/heartburn   [] Abdominal pain Genitourinary:  [] Chronic kidney disease   [] Difficult urination  [] Frequent urination  [] Burning with urination   [] Hematuria Skin:  [] Rashes   [] Ulcers   [] Wounds Psychological:  [] History of anxiety   []  History of major depression.     Physical Examination  BP 128/76 (BP Location: Right Arm, Patient Position: Sitting)   Pulse 97   Resp 18   Ht 5\' 8"  (1.727 m)   Wt 104.8 kg (231 lb)   BMI 35.12 kg/m  Gen:  WD/WN, NAD Head: [place]/AT, No temporalis wasting. Ear/Nose/Throat: Hearing grossly intact, nares w/o erythema or drainage, trachea midline Eyes: Conjunctiva clear. Sclera non-icteric Neck: Supple.  No JVD.  Pulmonary:  Good air movement, no use of accessory muscles.  Cardiac: RRR, normal S1, S2 Vascular:  Vessel Right Left  Radial Palpable Palpable                                   Gastrointestinal: soft, non-tender/non-distended.  Musculoskeletal: M/S 5/5 throughout.  No deformity or atrophy. Neurologic: Sensation grossly intact in extremities.  Symmetrical.  Speech is fluent.  Psychiatric: Judgment intact, Mood & affect appropriate for pt's clinical situation. Dermatologic: No rashes or ulcers noted.  No cellulitis or open wounds.      Labs No results found for this or any previous visit (from the past 2160 hour(s)).  Radiology No results  found.    Assessment/Plan  Diabetes (HCC) blood glucose control important in reducing the progression of atherosclerotic disease. Also, involved in wound healing. On appropriate medications.   AAA (abdominal aortic aneurysm) without rupture (HCC) His aortic duplex today shows slight increase in his aortic diameter which now measures 3.6 cm in maximal diameter previously this  was 3.5 cm. We will continue to follow this on an annual basis.  Smoking cessation and continuing to control his blood pressure were discussed.  Tobacco use disorder We had a discussion for approximately 3 minutes regarding the absolute need for smoking cessation due to the deleterious nature of tobacco on the vascular system. We discussed the tobacco use would diminish patency of any intervention, and likely significantly worsen progressio of disease. We discussed multiple agents for quitting including replacement therapy or medications to reduce cravings such as Chantix. The patient voices their understanding of the importance of smoking cessation.     Festus BarrenJason Parnell Spieler, MD  01/23/2017 10:11 AM    This note was created with Dragon medical transcription system.  Any errors from dictation are purely unintentional

## 2017-01-23 NOTE — Assessment & Plan Note (Signed)

## 2017-01-24 DIAGNOSIS — N390 Urinary tract infection, site not specified: Secondary | ICD-10-CM | POA: Diagnosis not present

## 2017-01-24 DIAGNOSIS — I129 Hypertensive chronic kidney disease with stage 1 through stage 4 chronic kidney disease, or unspecified chronic kidney disease: Secondary | ICD-10-CM | POA: Diagnosis not present

## 2017-01-24 DIAGNOSIS — E1122 Type 2 diabetes mellitus with diabetic chronic kidney disease: Secondary | ICD-10-CM | POA: Diagnosis not present

## 2017-01-24 DIAGNOSIS — N183 Chronic kidney disease, stage 3 (moderate): Secondary | ICD-10-CM | POA: Diagnosis not present

## 2017-01-24 DIAGNOSIS — R809 Proteinuria, unspecified: Secondary | ICD-10-CM | POA: Diagnosis not present

## 2017-01-24 DIAGNOSIS — N2581 Secondary hyperparathyroidism of renal origin: Secondary | ICD-10-CM | POA: Diagnosis not present

## 2017-01-25 DIAGNOSIS — E6609 Other obesity due to excess calories: Secondary | ICD-10-CM | POA: Diagnosis not present

## 2017-01-25 DIAGNOSIS — E1165 Type 2 diabetes mellitus with hyperglycemia: Secondary | ICD-10-CM | POA: Diagnosis not present

## 2017-01-25 DIAGNOSIS — F17210 Nicotine dependence, cigarettes, uncomplicated: Secondary | ICD-10-CM | POA: Diagnosis not present

## 2017-01-25 DIAGNOSIS — N3001 Acute cystitis with hematuria: Secondary | ICD-10-CM | POA: Diagnosis not present

## 2017-01-25 DIAGNOSIS — E7801 Familial hypercholesterolemia: Secondary | ICD-10-CM | POA: Diagnosis not present

## 2017-01-25 DIAGNOSIS — I10 Essential (primary) hypertension: Secondary | ICD-10-CM | POA: Diagnosis not present

## 2017-01-26 DIAGNOSIS — I10 Essential (primary) hypertension: Secondary | ICD-10-CM | POA: Diagnosis not present

## 2017-01-26 DIAGNOSIS — E7801 Familial hypercholesterolemia: Secondary | ICD-10-CM | POA: Diagnosis not present

## 2017-01-26 DIAGNOSIS — E559 Vitamin D deficiency, unspecified: Secondary | ICD-10-CM | POA: Diagnosis not present

## 2017-01-26 DIAGNOSIS — E6609 Other obesity due to excess calories: Secondary | ICD-10-CM | POA: Diagnosis not present

## 2017-01-26 DIAGNOSIS — N3001 Acute cystitis with hematuria: Secondary | ICD-10-CM | POA: Diagnosis not present

## 2017-01-26 DIAGNOSIS — E1165 Type 2 diabetes mellitus with hyperglycemia: Secondary | ICD-10-CM | POA: Diagnosis not present

## 2017-01-26 DIAGNOSIS — Z125 Encounter for screening for malignant neoplasm of prostate: Secondary | ICD-10-CM | POA: Diagnosis not present

## 2017-02-01 DIAGNOSIS — L732 Hidradenitis suppurativa: Secondary | ICD-10-CM | POA: Diagnosis not present

## 2017-02-01 DIAGNOSIS — L281 Prurigo nodularis: Secondary | ICD-10-CM | POA: Diagnosis not present

## 2017-02-01 DIAGNOSIS — L720 Epidermal cyst: Secondary | ICD-10-CM | POA: Diagnosis not present

## 2017-02-01 DIAGNOSIS — Z79899 Other long term (current) drug therapy: Secondary | ICD-10-CM | POA: Diagnosis not present

## 2017-02-01 DIAGNOSIS — R234 Changes in skin texture: Secondary | ICD-10-CM | POA: Diagnosis not present

## 2017-02-01 DIAGNOSIS — L819 Disorder of pigmentation, unspecified: Secondary | ICD-10-CM | POA: Diagnosis not present

## 2017-02-02 DIAGNOSIS — N3001 Acute cystitis with hematuria: Secondary | ICD-10-CM | POA: Diagnosis not present

## 2017-02-02 DIAGNOSIS — I714 Abdominal aortic aneurysm, without rupture: Secondary | ICD-10-CM | POA: Diagnosis not present

## 2017-02-02 DIAGNOSIS — F17210 Nicotine dependence, cigarettes, uncomplicated: Secondary | ICD-10-CM | POA: Diagnosis not present

## 2017-02-02 DIAGNOSIS — E559 Vitamin D deficiency, unspecified: Secondary | ICD-10-CM | POA: Diagnosis not present

## 2017-02-02 DIAGNOSIS — I10 Essential (primary) hypertension: Secondary | ICD-10-CM | POA: Diagnosis not present

## 2017-02-02 DIAGNOSIS — N184 Chronic kidney disease, stage 4 (severe): Secondary | ICD-10-CM | POA: Diagnosis not present

## 2017-02-02 DIAGNOSIS — E1165 Type 2 diabetes mellitus with hyperglycemia: Secondary | ICD-10-CM | POA: Diagnosis not present

## 2017-02-02 DIAGNOSIS — E7801 Familial hypercholesterolemia: Secondary | ICD-10-CM | POA: Diagnosis not present

## 2017-02-02 DIAGNOSIS — E6609 Other obesity due to excess calories: Secondary | ICD-10-CM | POA: Diagnosis not present

## 2017-02-05 DIAGNOSIS — E7801 Familial hypercholesterolemia: Secondary | ICD-10-CM | POA: Diagnosis not present

## 2017-02-05 DIAGNOSIS — F17210 Nicotine dependence, cigarettes, uncomplicated: Secondary | ICD-10-CM | POA: Diagnosis not present

## 2017-02-05 DIAGNOSIS — I10 Essential (primary) hypertension: Secondary | ICD-10-CM | POA: Diagnosis not present

## 2017-02-05 DIAGNOSIS — I714 Abdominal aortic aneurysm, without rupture: Secondary | ICD-10-CM | POA: Diagnosis not present

## 2017-02-05 DIAGNOSIS — N184 Chronic kidney disease, stage 4 (severe): Secondary | ICD-10-CM | POA: Diagnosis not present

## 2017-02-05 DIAGNOSIS — E559 Vitamin D deficiency, unspecified: Secondary | ICD-10-CM | POA: Diagnosis not present

## 2017-02-05 DIAGNOSIS — E6609 Other obesity due to excess calories: Secondary | ICD-10-CM | POA: Diagnosis not present

## 2017-02-05 DIAGNOSIS — E1165 Type 2 diabetes mellitus with hyperglycemia: Secondary | ICD-10-CM | POA: Diagnosis not present

## 2017-02-09 ENCOUNTER — Emergency Department: Payer: Medicare Other

## 2017-02-09 ENCOUNTER — Inpatient Hospital Stay
Admission: EM | Admit: 2017-02-09 | Discharge: 2017-02-11 | DRG: 871 | Disposition: A | Discharge status: Skilled Nursing Facility | Payer: Medicare Other | Attending: Internal Medicine | Admitting: Internal Medicine

## 2017-02-09 ENCOUNTER — Other Ambulatory Visit: Payer: Self-pay

## 2017-02-09 DIAGNOSIS — Z794 Long term (current) use of insulin: Secondary | ICD-10-CM | POA: Diagnosis not present

## 2017-02-09 DIAGNOSIS — E11649 Type 2 diabetes mellitus with hypoglycemia without coma: Secondary | ICD-10-CM | POA: Diagnosis not present

## 2017-02-09 DIAGNOSIS — L97919 Non-pressure chronic ulcer of unspecified part of right lower leg with unspecified severity: Secondary | ICD-10-CM | POA: Diagnosis present

## 2017-02-09 DIAGNOSIS — E119 Type 2 diabetes mellitus without complications: Secondary | ICD-10-CM | POA: Diagnosis not present

## 2017-02-09 DIAGNOSIS — I130 Hypertensive heart and chronic kidney disease with heart failure and stage 1 through stage 4 chronic kidney disease, or unspecified chronic kidney disease: Secondary | ICD-10-CM | POA: Diagnosis present

## 2017-02-09 DIAGNOSIS — E7801 Familial hypercholesterolemia: Secondary | ICD-10-CM | POA: Diagnosis not present

## 2017-02-09 DIAGNOSIS — D696 Thrombocytopenia, unspecified: Secondary | ICD-10-CM | POA: Diagnosis present

## 2017-02-09 DIAGNOSIS — E1122 Type 2 diabetes mellitus with diabetic chronic kidney disease: Secondary | ICD-10-CM | POA: Diagnosis present

## 2017-02-09 DIAGNOSIS — Z7983 Long term (current) use of bisphosphonates: Secondary | ICD-10-CM

## 2017-02-09 DIAGNOSIS — L0390 Cellulitis, unspecified: Secondary | ICD-10-CM | POA: Diagnosis not present

## 2017-02-09 DIAGNOSIS — R55 Syncope and collapse: Secondary | ICD-10-CM | POA: Diagnosis not present

## 2017-02-09 DIAGNOSIS — G4730 Sleep apnea, unspecified: Secondary | ICD-10-CM | POA: Diagnosis present

## 2017-02-09 DIAGNOSIS — I509 Heart failure, unspecified: Secondary | ICD-10-CM | POA: Diagnosis not present

## 2017-02-09 DIAGNOSIS — Z7401 Bed confinement status: Secondary | ICD-10-CM | POA: Diagnosis not present

## 2017-02-09 DIAGNOSIS — Z7982 Long term (current) use of aspirin: Secondary | ICD-10-CM

## 2017-02-09 DIAGNOSIS — I2510 Atherosclerotic heart disease of native coronary artery without angina pectoris: Secondary | ICD-10-CM | POA: Diagnosis present

## 2017-02-09 DIAGNOSIS — E6609 Other obesity due to excess calories: Secondary | ICD-10-CM | POA: Diagnosis not present

## 2017-02-09 DIAGNOSIS — M129 Arthropathy, unspecified: Secondary | ICD-10-CM | POA: Diagnosis not present

## 2017-02-09 DIAGNOSIS — A4902 Methicillin resistant Staphylococcus aureus infection, unspecified site: Secondary | ICD-10-CM | POA: Diagnosis not present

## 2017-02-09 DIAGNOSIS — F0390 Unspecified dementia without behavioral disturbance: Secondary | ICD-10-CM | POA: Diagnosis not present

## 2017-02-09 DIAGNOSIS — N400 Enlarged prostate without lower urinary tract symptoms: Secondary | ICD-10-CM | POA: Diagnosis present

## 2017-02-09 DIAGNOSIS — A419 Sepsis, unspecified organism: Secondary | ICD-10-CM | POA: Diagnosis not present

## 2017-02-09 DIAGNOSIS — A4102 Sepsis due to Methicillin resistant Staphylococcus aureus: Secondary | ICD-10-CM | POA: Diagnosis not present

## 2017-02-09 DIAGNOSIS — G9341 Metabolic encephalopathy: Secondary | ICD-10-CM | POA: Diagnosis not present

## 2017-02-09 DIAGNOSIS — R4182 Altered mental status, unspecified: Secondary | ICD-10-CM | POA: Diagnosis not present

## 2017-02-09 DIAGNOSIS — F0391 Unspecified dementia with behavioral disturbance: Secondary | ICD-10-CM | POA: Diagnosis not present

## 2017-02-09 DIAGNOSIS — N183 Chronic kidney disease, stage 3 (moderate): Secondary | ICD-10-CM | POA: Diagnosis present

## 2017-02-09 DIAGNOSIS — S0990XA Unspecified injury of head, initial encounter: Secondary | ICD-10-CM | POA: Diagnosis not present

## 2017-02-09 DIAGNOSIS — Z961 Presence of intraocular lens: Secondary | ICD-10-CM | POA: Diagnosis present

## 2017-02-09 DIAGNOSIS — I5032 Chronic diastolic (congestive) heart failure: Secondary | ICD-10-CM | POA: Diagnosis not present

## 2017-02-09 DIAGNOSIS — I714 Abdominal aortic aneurysm, without rupture: Secondary | ICD-10-CM | POA: Diagnosis present

## 2017-02-09 DIAGNOSIS — I5042 Chronic combined systolic (congestive) and diastolic (congestive) heart failure: Secondary | ICD-10-CM | POA: Diagnosis present

## 2017-02-09 DIAGNOSIS — Z881 Allergy status to other antibiotic agents status: Secondary | ICD-10-CM

## 2017-02-09 DIAGNOSIS — L03116 Cellulitis of left lower limb: Secondary | ICD-10-CM | POA: Diagnosis present

## 2017-02-09 DIAGNOSIS — L03119 Cellulitis of unspecified part of limb: Secondary | ICD-10-CM | POA: Diagnosis not present

## 2017-02-09 DIAGNOSIS — E872 Acidosis: Secondary | ICD-10-CM | POA: Diagnosis present

## 2017-02-09 DIAGNOSIS — I340 Nonrheumatic mitral (valve) insufficiency: Secondary | ICD-10-CM | POA: Diagnosis not present

## 2017-02-09 DIAGNOSIS — R7881 Bacteremia: Secondary | ICD-10-CM | POA: Diagnosis not present

## 2017-02-09 DIAGNOSIS — D631 Anemia in chronic kidney disease: Secondary | ICD-10-CM | POA: Diagnosis not present

## 2017-02-09 DIAGNOSIS — I10 Essential (primary) hypertension: Secondary | ICD-10-CM | POA: Diagnosis not present

## 2017-02-09 DIAGNOSIS — I330 Acute and subacute infective endocarditis: Secondary | ICD-10-CM | POA: Diagnosis not present

## 2017-02-09 DIAGNOSIS — F329 Major depressive disorder, single episode, unspecified: Secondary | ICD-10-CM | POA: Diagnosis present

## 2017-02-09 DIAGNOSIS — R627 Adult failure to thrive: Secondary | ICD-10-CM | POA: Diagnosis present

## 2017-02-09 DIAGNOSIS — L03115 Cellulitis of right lower limb: Secondary | ICD-10-CM | POA: Diagnosis present

## 2017-02-09 DIAGNOSIS — M6281 Muscle weakness (generalized): Secondary | ICD-10-CM | POA: Diagnosis not present

## 2017-02-09 DIAGNOSIS — L97929 Non-pressure chronic ulcer of unspecified part of left lower leg with unspecified severity: Secondary | ICD-10-CM | POA: Diagnosis present

## 2017-02-09 DIAGNOSIS — B9561 Methicillin susceptible Staphylococcus aureus infection as the cause of diseases classified elsewhere: Secondary | ICD-10-CM | POA: Diagnosis not present

## 2017-02-09 DIAGNOSIS — I878 Other specified disorders of veins: Secondary | ICD-10-CM | POA: Diagnosis not present

## 2017-02-09 DIAGNOSIS — E1165 Type 2 diabetes mellitus with hyperglycemia: Secondary | ICD-10-CM | POA: Diagnosis present

## 2017-02-09 DIAGNOSIS — N184 Chronic kidney disease, stage 4 (severe): Secondary | ICD-10-CM | POA: Diagnosis not present

## 2017-02-09 DIAGNOSIS — R600 Localized edema: Secondary | ICD-10-CM | POA: Diagnosis not present

## 2017-02-09 DIAGNOSIS — Z8249 Family history of ischemic heart disease and other diseases of the circulatory system: Secondary | ICD-10-CM

## 2017-02-09 DIAGNOSIS — R262 Difficulty in walking, not elsewhere classified: Secondary | ICD-10-CM | POA: Diagnosis not present

## 2017-02-09 DIAGNOSIS — R609 Edema, unspecified: Secondary | ICD-10-CM | POA: Diagnosis not present

## 2017-02-09 DIAGNOSIS — E162 Hypoglycemia, unspecified: Secondary | ICD-10-CM | POA: Diagnosis not present

## 2017-02-09 DIAGNOSIS — W19XXXA Unspecified fall, initial encounter: Secondary | ICD-10-CM | POA: Diagnosis not present

## 2017-02-09 DIAGNOSIS — R4020 Unspecified coma: Secondary | ICD-10-CM | POA: Diagnosis not present

## 2017-02-09 DIAGNOSIS — F17210 Nicotine dependence, cigarettes, uncomplicated: Secondary | ICD-10-CM | POA: Diagnosis not present

## 2017-02-09 DIAGNOSIS — F339 Major depressive disorder, recurrent, unspecified: Secondary | ICD-10-CM | POA: Diagnosis not present

## 2017-02-09 DIAGNOSIS — Z79899 Other long term (current) drug therapy: Secondary | ICD-10-CM

## 2017-02-09 DIAGNOSIS — E559 Vitamin D deficiency, unspecified: Secondary | ICD-10-CM | POA: Diagnosis not present

## 2017-02-09 LAB — COMPREHENSIVE METABOLIC PANEL
ALK PHOS: 93 U/L (ref 38–126)
ALT: 27 U/L (ref 17–63)
ANION GAP: 10 (ref 5–15)
AST: 42 U/L — ABNORMAL HIGH (ref 15–41)
Albumin: 3.2 g/dL — ABNORMAL LOW (ref 3.5–5.0)
BILIRUBIN TOTAL: 3 mg/dL — AB (ref 0.3–1.2)
BUN: 35 mg/dL — ABNORMAL HIGH (ref 6–20)
CALCIUM: 8.9 mg/dL (ref 8.9–10.3)
CO2: 25 mmol/L (ref 22–32)
Chloride: 100 mmol/L — ABNORMAL LOW (ref 101–111)
Creatinine, Ser: 1.33 mg/dL — ABNORMAL HIGH (ref 0.61–1.24)
GFR, EST NON AFRICAN AMERICAN: 53 mL/min — AB (ref >60)
Glucose, Bld: 240 mg/dL — ABNORMAL HIGH (ref 65–99)
Potassium: 4.7 mmol/L (ref 3.5–5.1)
SODIUM: 135 mmol/L (ref 135–145)
TOTAL PROTEIN: 8 g/dL (ref 6.5–8.1)

## 2017-02-09 LAB — CBC WITH DIFFERENTIAL/PLATELET
BASOS ABS: 0 10*3/uL (ref 0–0.1)
Basophils Relative: 0 %
EOS ABS: 0 10*3/uL (ref 0–0.7)
EOS PCT: 0 %
HCT: 52.6 % — ABNORMAL HIGH (ref 40.0–52.0)
Hemoglobin: 17.5 g/dL (ref 13.0–18.0)
Lymphocytes Relative: 12 %
Lymphs Abs: 1.5 10*3/uL (ref 1.0–3.6)
MCH: 32.5 pg (ref 26.0–34.0)
MCHC: 33.2 g/dL (ref 32.0–36.0)
MCV: 97.8 fL (ref 80.0–100.0)
MONO ABS: 0.8 10*3/uL (ref 0.2–1.0)
Monocytes Relative: 6 %
Neutro Abs: 10.5 10*3/uL — ABNORMAL HIGH (ref 1.4–6.5)
Neutrophils Relative %: 82 %
PLATELETS: 126 10*3/uL — AB (ref 150–440)
RBC: 5.38 MIL/uL (ref 4.40–5.90)
RDW: 16.1 % — AB (ref 11.5–14.5)
WBC: 12.9 10*3/uL — AB (ref 3.8–10.6)

## 2017-02-09 LAB — URINALYSIS, COMPLETE (UACMP) WITH MICROSCOPIC
BACTERIA UA: NONE SEEN
BILIRUBIN URINE: NEGATIVE
Glucose, UA: 50 mg/dL — AB
Ketones, ur: NEGATIVE mg/dL
Leukocytes, UA: NEGATIVE
NITRITE: NEGATIVE
PH: 5 (ref 5.0–8.0)
Protein, ur: >=300 mg/dL — AB
SPECIFIC GRAVITY, URINE: 1.016 (ref 1.005–1.030)
Squamous Epithelial / LPF: NONE SEEN

## 2017-02-09 LAB — GLUCOSE, CAPILLARY
GLUCOSE-CAPILLARY: 203 mg/dL — AB (ref 65–99)
GLUCOSE-CAPILLARY: 226 mg/dL — AB (ref 65–99)

## 2017-02-09 LAB — HEMOGLOBIN A1C
Hgb A1c MFr Bld: 9.8 % — ABNORMAL HIGH (ref 4.8–5.6)
MEAN PLASMA GLUCOSE: 234.56 mg/dL

## 2017-02-09 LAB — CREATININE, SERUM
Creatinine, Ser: 1.4 mg/dL — ABNORMAL HIGH (ref 0.61–1.24)
GFR, EST AFRICAN AMERICAN: 58 mL/min — AB (ref >60)
GFR, EST NON AFRICAN AMERICAN: 50 mL/min — AB (ref >60)

## 2017-02-09 LAB — LACTIC ACID, PLASMA
LACTIC ACID, VENOUS: 2.6 mmol/L — AB (ref 0.5–1.9)
LACTIC ACID, VENOUS: 2.1 mmol/L — AB (ref 0.5–1.9)

## 2017-02-09 LAB — PROTIME-INR
INR: 1.3
PROTHROMBIN TIME: 16.1 s — AB (ref 11.4–15.2)

## 2017-02-09 LAB — INFLUENZA PANEL BY PCR (TYPE A & B)
INFLAPCR: NEGATIVE
INFLBPCR: NEGATIVE

## 2017-02-09 LAB — PROCALCITONIN: Procalcitonin: 4.09 ng/mL

## 2017-02-09 MED ORDER — SENNOSIDES-DOCUSATE SODIUM 8.6-50 MG PO TABS: 1.0000 | ORAL_TABLET | Freq: Every evening | ORAL | Status: DC | PRN

## 2017-02-09 MED ORDER — SIMVASTATIN 20 MG PO TABS
20.0000 mg | ORAL_TABLET | Freq: Every evening | ORAL | Status: DC
  Administered 2017-02-09 – 2017-02-10 (×2): 20 mg via ORAL
  Filled 2017-02-09 (×2): qty 1

## 2017-02-09 MED ORDER — KETOROLAC TROMETHAMINE 15 MG/ML IJ SOLN: 15.0000 mg | Freq: Four times a day (QID) | INTRAMUSCULAR | Status: DC | PRN

## 2017-02-09 MED ORDER — ONDANSETRON HCL 4 MG PO TABS: 4.0000 mg | ORAL_TABLET | Freq: Four times a day (QID) | ORAL | Status: DC | PRN

## 2017-02-09 MED ORDER — VITAMIN D (ERGOCALCIFEROL) 1.25 MG (50000 UNIT) PO CAPS
50000.0000 [IU] | ORAL_CAPSULE | ORAL | Status: DC
  Administered 2017-02-10: 50000 [IU] via ORAL
  Filled 2017-02-09: qty 1

## 2017-02-09 MED ORDER — VANCOMYCIN HCL IN DEXTROSE 1-5 GM/200ML-% IV SOLN
1000.0000 mg | Freq: Once | INTRAVENOUS | Status: AC
  Administered 2017-02-09: 1000 mg via INTRAVENOUS

## 2017-02-09 MED ORDER — ACETAMINOPHEN 650 MG RE SUPP: 650.0000 mg | Freq: Four times a day (QID) | RECTAL | Status: DC | PRN

## 2017-02-09 MED ORDER — HYDROCODONE-ACETAMINOPHEN 5-325 MG PO TABS: 1.0000 | ORAL_TABLET | ORAL | Status: DC | PRN

## 2017-02-09 MED ORDER — HYDROXYZINE HCL 25 MG PO TABS: 25.0000 mg | ORAL_TABLET | Freq: Every evening | ORAL | Status: DC | PRN

## 2017-02-09 MED ORDER — PIPERACILLIN-TAZOBACTAM 3.375 G IVPB 30 MIN
3.3750 g | Freq: Once | INTRAVENOUS | Status: AC
  Administered 2017-02-09: 3.375 g via INTRAVENOUS

## 2017-02-09 MED ORDER — CARVEDILOL 3.125 MG PO TABS
3.1250 mg | ORAL_TABLET | Freq: Two times a day (BID) | ORAL | Status: DC
  Filled 2017-02-09 (×2): qty 1

## 2017-02-09 MED ORDER — INSULIN ASPART PROT & ASPART (70-30 MIX) 100 UNIT/ML ~~LOC~~ SUSP
30.0000 [IU] | Freq: Two times a day (BID) | SUBCUTANEOUS | Status: DC
  Administered 2017-02-09: 30 [IU] via SUBCUTANEOUS
  Filled 2017-02-09: qty 1

## 2017-02-09 MED ORDER — VANCOMYCIN HCL IN DEXTROSE 1-5 GM/200ML-% IV SOLN
INTRAVENOUS | Status: AC
  Filled 2017-02-09: qty 200

## 2017-02-09 MED ORDER — SODIUM CHLORIDE 0.9 % IV SOLN: 1500.0000 mg | INTRAVENOUS | Status: DC

## 2017-02-09 MED ORDER — PIPERACILLIN-TAZOBACTAM 3.375 G IVPB
INTRAVENOUS | Status: AC
  Filled 2017-02-09: qty 50

## 2017-02-09 MED ORDER — ONDANSETRON HCL 4 MG/2ML IJ SOLN: 4.0000 mg | Freq: Four times a day (QID) | INTRAMUSCULAR | Status: DC | PRN

## 2017-02-09 MED ORDER — SODIUM CHLORIDE 0.9 % IV SOLN: INTRAVENOUS | Status: AC

## 2017-02-09 MED ORDER — SODIUM CHLORIDE 0.9 % IV BOLUS (SEPSIS)
1000.0000 mL | Freq: Once | INTRAVENOUS | Status: AC
  Administered 2017-02-09: 1000 mL via INTRAVENOUS

## 2017-02-09 MED ORDER — INSULIN ASPART 100 UNIT/ML ~~LOC~~ SOLN
0.0000 [IU] | Freq: Every day | SUBCUTANEOUS | Status: DC
  Administered 2017-02-09 – 2017-02-10 (×2): 2 [IU] via SUBCUTANEOUS
  Filled 2017-02-09 (×2): qty 1

## 2017-02-09 MED ORDER — BISACODYL 5 MG PO TBEC: 5.0000 mg | DELAYED_RELEASE_TABLET | Freq: Every day | ORAL | Status: DC | PRN

## 2017-02-09 MED ORDER — VANCOMYCIN HCL 10 G IV SOLR
1250.0000 mg | INTRAVENOUS | Status: DC
  Administered 2017-02-09 – 2017-02-11 (×3): 1250 mg via INTRAVENOUS
  Filled 2017-02-09 (×3): qty 1250

## 2017-02-09 MED ORDER — TORSEMIDE 20 MG PO TABS: 100.0000 mg | ORAL_TABLET | Freq: Every day | ORAL | Status: DC

## 2017-02-09 MED ORDER — TAMSULOSIN HCL 0.4 MG PO CAPS
0.4000 mg | ORAL_CAPSULE | Freq: Every day | ORAL | Status: DC
  Administered 2017-02-10: 0.4 mg via ORAL
  Filled 2017-02-09 (×2): qty 1

## 2017-02-09 MED ORDER — ALBUTEROL SULFATE (2.5 MG/3ML) 0.083% IN NEBU: 2.5000 mg | INHALATION_SOLUTION | RESPIRATORY_TRACT | Status: DC | PRN

## 2017-02-09 MED ORDER — SACUBITRIL-VALSARTAN 24-26 MG PO TABS
1.0000 | ORAL_TABLET | Freq: Two times a day (BID) | ORAL | Status: DC
  Administered 2017-02-09 – 2017-02-10 (×2): 1 via ORAL
  Filled 2017-02-09 (×3): qty 1

## 2017-02-09 MED ORDER — MONTELUKAST SODIUM 10 MG PO TABS
10.0000 mg | ORAL_TABLET | Freq: Every day | ORAL | Status: DC
  Administered 2017-02-09 – 2017-02-10 (×2): 10 mg via ORAL
  Filled 2017-02-09 (×2): qty 1

## 2017-02-09 MED ORDER — ASPIRIN EC 81 MG PO TBEC
81.0000 mg | DELAYED_RELEASE_TABLET | Freq: Every day | ORAL | Status: DC
  Administered 2017-02-09 – 2017-02-10 (×2): 81 mg via ORAL
  Filled 2017-02-09 (×2): qty 1

## 2017-02-09 MED ORDER — SODIUM CHLORIDE 0.9 % IV SOLN
INTRAVENOUS | Status: DC
  Administered 2017-02-09: 17:00:00 via INTRAVENOUS

## 2017-02-09 MED ORDER — HEPARIN SODIUM (PORCINE) 5000 UNIT/ML IJ SOLN
5000.0000 [IU] | Freq: Three times a day (TID) | INTRAMUSCULAR | Status: DC
  Administered 2017-02-09 – 2017-02-11 (×5): 5000 [IU] via SUBCUTANEOUS
  Filled 2017-02-09 (×5): qty 1

## 2017-02-09 MED ORDER — DILTIAZEM HCL ER 120 MG PO CP24
120.0000 mg | ORAL_CAPSULE | Freq: Every day | ORAL | Status: DC
  Filled 2017-02-09 (×3): qty 1

## 2017-02-09 MED ORDER — ACETAMINOPHEN 325 MG PO TABS
650.0000 mg | ORAL_TABLET | Freq: Four times a day (QID) | ORAL | Status: DC | PRN
  Administered 2017-02-09: 650 mg via ORAL
  Filled 2017-02-09: qty 2

## 2017-02-09 MED ORDER — PIPERACILLIN-TAZOBACTAM 3.375 G IVPB
3.3750 g | Freq: Three times a day (TID) | INTRAVENOUS | Status: DC
  Administered 2017-02-09 – 2017-02-10 (×2): 3.375 g via INTRAVENOUS
  Filled 2017-02-09 (×2): qty 50

## 2017-02-09 MED ORDER — INSULIN ASPART 100 UNIT/ML ~~LOC~~ SOLN
0.0000 [IU] | Freq: Three times a day (TID) | SUBCUTANEOUS | Status: DC
  Administered 2017-02-09: 3 [IU] via SUBCUTANEOUS
  Administered 2017-02-10: 1 [IU] via SUBCUTANEOUS
  Administered 2017-02-11: 7 [IU] via SUBCUTANEOUS
  Filled 2017-02-09 (×3): qty 1

## 2017-02-09 NOTE — Progress Notes (Signed)
Inpatient Diabetes Program Recommendations  AACE/ADA: New Consensus Statement on Inpatient Glycemic Control (2015)  Target Ranges:  Prepandial:   less than 140 mg/dL      Peak postprandial:   less than 180 mg/dL (1-2 hours)      Critically ill patients:  140 - 180 mg/dL   Results for Jeremy Gutierrez, Jeremy Gutierrez (MRN [id number]) as of 02/09/2017 22:04  Ref. Range 02/09/2017 16:42 02/09/2017 20:56  Glucose-Capillary Latest Ref Range: 65 - 99 mg/dL 409203 (H) 811226 (H)    Admit with: Syncope/ Sepsis/ Bilateral Leg Infections  History: DM, CHF  Home DM Meds: Humalog 75/25 Insulin- 30 units AM/ 25 units PM  Current Insulin Orders: 70/30 Insulin- 30 units BID      Novolog Sensitive Correction Scale/ SSI (0-9 units) TID AC + HS     MD- Note consult for DM management.  Note that 70/30 Insulin and Novolog started this evening.  Agree with current orders.  Will follow and make recommendations.     --Will follow patient during hospitalization--  Ambrose FinlandJeannine Johnston Jelisa  RN, MSN, CDE Diabetes Coordinator Inpatient Glycemic Control Team Team Pager: [phone] (8a-5p)

## 2017-02-09 NOTE — Progress Notes (Signed)
Pharmacy Antibiotic Note  Jeremy Gutierrez is a 66 y.o. male admitted on 02/09/2017 with sepsis.  Pharmacy has been consulted for vancomycin and Zosyn dosing.  Plan: Initiate Zosyn 3.375g q8h  Initiate Vancomycin 1250mg  q18h  Vancomycin Trough Goal: 15-20  Will check vancomycin trough before fourth dose  SrCr elevated; Will check serum creatinine tomorrow and adjust dose accordingly   Adjusted BW: 81.3kg  T1/2: 12.6  Ke: 0.055 Vd: 73  Cmin: 15.6  Cmax: 40    Height: 5\' 7"  (170.2 cm) Weight: 230 lb (104.3 kg) IBW/kg (Calculated) : 66.1  Temp (24hrs), Avg:103.3 F (39.6 C), Min:103.3 F (39.6 C), Max:103.3 F (39.6 C)  Recent Labs  Lab 02/09/17 1207  WBC 12.9*  CREATININE 1.33*  LATICACIDVEN 2.6*    Estimated Creatinine Clearance: 61.2 mL/min (A) (by C-G formula based on SCr of 1.33 mg/dL (H)).    Allergies  Allergen Reactions  . Sulfa Antibiotics Hives    Patient states he is not allergic to this medication    Antimicrobials this admission: 12/21 Vanc >>  12/21 Zosyn >>   Microbiology results: 12/21 BCx:  12/21 UCx:    Thank you for allowing pharmacy to be a part of this patient's care.  Cleopatra CedarStephanie Delories Mauri  Pharmacy Resident  02/09/2017 1:46 PM

## 2017-02-09 NOTE — H&P (Addendum)
[facility] - [place] at [facility]   PATIENT NAME: Jeremy Gutierrez    MR#:  [id number]  DATE OF BIRTH:  [birth year]  DATE OF ADMISSION:  02/09/2017  PRIMARY CARE PHYSICIAN: Derwood Kaplan, MD   REQUESTING/REFERRING PHYSICIAN: Jene Every, MD  CHIEF COMPLAINT:   Chief Complaint  Patient presents with  . Loss of Consciousness   Altered mental status. HISTORY OF PRESENT ILLNESS:  Jeremy Gutierrez  is a 66 y.o. male with a known history of hypertension, diabetes, CHF, sleep apnea and aneurysm.  The patient was sent from doctor's office after syncope episode there.  He was very confused and denies any complaints.  He was found sepsis with fever of 103, tachycardia, leukocytosis and bilateral leg infection.  He is given antibiotics with vancomycin and Zosyn in the ED.  He said he has has bilateral leg swelling and nonhealing ulcers for a long time. PAST MEDICAL HISTORY:   Past Medical History:  Diagnosis Date  . Aneurysm (HCC)   . Arthritis   . CHF (congestive heart failure) (HCC)   . Diabetes mellitus without complication (HCC)   . Heart murmur    as a child- 'nothing to worry about'  . Sleep apnea     PAST SURGICAL HISTORY:   Past Surgical History:  Procedure Laterality Date  . BUNIONECTOMY Right   . CARDIAC CATHETERIZATION  2010  . CATARACT EXTRACTION W/PHACO Right 06/05/2012   Procedure: CATARACT EXTRACTION PHACO AND INTRAOCULAR LENS PLACEMENT (IOC);  Surgeon: Shade Flood, MD;  Location: [facility] OR;  Service: Ophthalmology;  Laterality: Right;  . Colonscopy  2012    SOCIAL HISTORY:   Social History   Tobacco Use  . Smoking status: Current Every Day Smoker    Packs/day: 0.50    Years: 40.00    Pack years: 20.00    Types: Cigarettes    Last attempt to quit: 06/02/2012    Years since quitting: 4.6  . Smokeless tobacco: Never Used  . Tobacco comment: smoked for many years.  Substance Use Topics  . Alcohol use: Yes    Comment: social    FAMILY  HISTORY:   Family History  Problem Relation Age of Onset  . CAD Mother   . Cancer Sister     DRUG ALLERGIES:   Allergies  Allergen Reactions  . Sulfa Antibiotics Hives    Patient states he is not allergic to this medication    REVIEW OF SYSTEMS:   Review of Systems  Unable to perform ROS: Mental status change    MEDICATIONS AT HOME:   Prior to Admission medications   Medication Sig Start Date End Date Taking? Authorizing Provider  aspirin 81 MG tablet Take 81 mg by mouth daily.   Yes [provider]  carvedilol (COREG) 3.125 MG tablet Take 3.125 mg by mouth 2 (two) times daily with a meal.   Yes [provider]  doxycycline (VIBRAMYCIN) 100 MG capsule Take 100 mg by mouth 2 (two) times daily. 01/16/17  Yes [provider]  hydrOXYzine (ATARAX/VISTARIL) 25 MG tablet TAKE 1 TO 2 TABLETS BY MOUTH AT BEDTIME IF NEEDED FOR ITCHING 01/08/17  Yes [provider]  insulin lispro protamine-lispro (HUMALOG 75/25 MIX) (75-25) 100 UNIT/ML SUSP injection Inject 32 Units into the skin 2 (two) times daily with a meal. 30 morning and 25 at bedtime 04/04/16  Yes Shaune Pollack, MD  montelukast (SINGULAIR) 10 MG tablet TK 1 T PO  D 12/23/16  Yes [provider]  nitrofurantoin, macrocrystal-monohydrate, (MACROBID) 100 MG capsule take 1 capsule by mouth every 12 hours with food 01/19/17  Yes [provider]  sacubitril-valsartan (ENTRESTO) 24-26 MG Take by mouth. 05/08/16  Yes [provider]  simvastatin (ZOCOR) 20 MG tablet Take 20 mg by mouth every evening.   Yes [provider]  tamsulosin (FLOMAX) 0.4 MG CAPS capsule Take 0.4 mg by mouth daily. 01/24/17  Yes [provider]  torsemide (DEMADEX) 100 MG tablet  12/20/16  Yes [provider]  Vitamin D, Ergocalciferol, (DRISDOL) 50000 units CAPS capsule Take 1 capsule by mouth every 30 (thirty) days. 01/24/17  Yes [provider]  DILT-XR 120 MG 24 hr  capsule Take 120 mg by mouth daily.  12/19/15   [provider]  ONE TOUCH ULTRA TEST test strip  12/20/16   [provider]      VITAL SIGNS:  Blood pressure 122/68, pulse (!) 110, temperature (!) 103.3 F (39.6 C), temperature source Rectal, resp. rate (!) 23, height 5\' 7"  (1.702 m), weight 230 lb (104.3 kg), SpO2 90 %.  PHYSICAL EXAMINATION:  Physical Exam  GENERAL:  66 y.o.-year-old patient lying in the bed with no acute distress.  EYES: Pupils equal, round, reactive to light and accommodation. No scleral icterus. Extraocular muscles intact.  HEENT: Head atraumatic, normocephalic. Oropharynx and nasopharynx clear.  NECK:  Supple, no jugular venous distention. No thyroid enlargement, no tenderness.  LUNGS: Normal breath sounds bilaterally, no wheezing, rales,rhonchi or crepitation. No use of accessory muscles of respiration.  CARDIOVASCULAR: S1, S2 normal. No murmurs, rubs, or gallops.  ABDOMEN: Soft, nontender, nondistended. Bowel sounds present. No organomegaly or mass.  EXTREMITIES: No cyanosis, or clubbing.  Bilateral leg and the foot swelling, erythema and nonhealing ulcers with some discharge. NEUROLOGIC: The patient is confused and unable to exam. PSYCHIATRIC: The patient is confused. SKIN: No obvious rash, lesion, or ulcer.   LABORATORY PANEL:   CBC Recent Labs  Lab 02/09/17 1207  WBC 12.9*  HGB 17.5  HCT 52.6*  PLT 126*   ------------------------------------------------------------------------------------------------------------------  Chemistries  Recent Labs  Lab 02/09/17 1207  NA 135  K 4.7  CL 100*  CO2 25  GLUCOSE 240*  BUN 35*  CREATININE 1.33*  CALCIUM 8.9  AST 42*  ALT 27  ALKPHOS 93  BILITOT 3.0*   ------------------------------------------------------------------------------------------------------------------  Cardiac Enzymes No results for input(s): TROPONINI in the last 168  hours. ------------------------------------------------------------------------------------------------------------------  RADIOLOGY:  Dg Chest 2 View  Result Date: 02/09/2017 CLINICAL DATA:  Sepsis.  Syncopal episode today. EXAM: CHEST  2 VIEW COMPARISON:  04/02/2016 FINDINGS: Mild enlargement of the cardiopericardial silhouette, stable. No mediastinal or hilar masses. No evidence of adenopathy. Clear lungs.  No pleural effusion or pneumothorax. Skeletal structures are intact. IMPRESSION: 1. No acute cardiopulmonary disease. 2. Stable cardiomegaly. Electronically Signed   By: Amie Portlandavid  Ormond M.D.   On: 02/09/2017 13:06   Ct Head Wo Contrast  Result Date: 02/09/2017 CLINICAL DATA:  EMS reports pt had a syncopal episode that resulted in LOC. EMS reports pt hit his head during syncopal episode. Pt does not have a hx of seizures. Pt does have hx of CHF. Pt does not recall what happened to him. EXAM: CT HEAD WITHOUT CONTRAST TECHNIQUE: Contiguous axial images were obtained from the base of the skull through the vertex without intravenous contrast. COMPARISON:  04/03/2016 FINDINGS: Brain: No evidence of acute infarction, hemorrhage, hydrocephalus, extra-axial collection or mass lesion/mass effect. Patchy white matter hypoattenuation noted consistent with moderate chronic  microvascular ischemic change. Vascular: No hyperdense vessel or unexpected calcification. Skull: Normal. Negative for fracture or focal lesion. Sinuses/Orbits: Dependent fluid is noted in both maxillary sinuses. Mucosal thickening lines the ethmoid, sphenoid and maxillary sinuses. Mastoid air cells are clear. Other: None. IMPRESSION: 1. No acute intracranial abnormalities. 2. Significant sinus disease with bilateral maxillary air-fluid levels. Findings consistent with acute sinusitis in the proper clinical setting. Electronically Signed   By: Amie Portlandavid  Ormond M.D.   On: 02/09/2017 12:48      IMPRESSION AND PLAN:   Sepsis due to bilateral  leg infection. The patient will be admitted to medical floor.  Continue vancomycin and Zosyn, follow-up CBC and cultures.  Wound care consult, vascular surgery consult and podiatry consult.  Lactic acidosis.  Due to above, treatment as above and follow-up lactic acid level.  Acute metabolic encephalopathy due to above.  Aspiration and fall precaution.  Diabetes.  Start sliding scale and continue home medication.  Hypertension.  Continue torsemide, Coreg and Entresto.  Hold if blood pressure is low.  Chronic diastolic CHF LV EF: 60% -   65%.  Stable.  Continue torsemide.  CKD stage III.  Stable.  Thrombocytopenia.  Stable.  All the records are reviewed and case discussed with ED provider. Management plans discussed with the patient, family and they are in agreement.  CODE STATUS: Code  TOTAL TIME TAKING CARE OF THIS PATIENT: 56 minutes.    Shaune PollackQing Adrian Dinovo M.D on 02/09/2017 at 3:09 PM  Between 7am to 6pm - Pager - [phone]  After 6pm go to [email or web address] - Social research officer, governmentpassword [email or web address]  [facility] [place] [facility]  Office  [phone]  CC: Primary care physician; Derwood KaplanEason, Ernest B, MD   Note: This dictation was prepared with Dragon dictation along with smaller phrase technology. Any transcriptional errors that result from this process are unin

## 2017-02-09 NOTE — Progress Notes (Signed)
CODE SEPSIS - PHARMACY COMMUNICATION  **Broad Spectrum Antibiotics should be administered within 1 hour of Sepsis diagnosis**  Time Code Sepsis Called/Page Received: Did not receive page; Based on ED track board, patient arrival in ED @ 1201   Antibiotics Ordered: Vancomycin and Zosyn   Time of 1st antibiotic administration: 1222  Additional action taken by pharmacy: N/A   If necessary, Name of Provider/Nurse Contacted: N/A     Cleopatra CedarStephanie Myka Hitz ,PharmD Clinical Pharmacist Resident  02/09/2017  12:34 PM

## 2017-02-09 NOTE — ED Provider Notes (Signed)
[facility] Emergency Department Provider Note   ____________________________________________    I have reviewed the triage vital signs and the nursing notes.   HISTORY  Chief Complaint Loss of Consciousness  History limited by altered mental status   HPI Jeremy Gutierrez is a 66 y.o. male who presents with altered mental status.  Apparently sent from doctor's office after syncopal episode there.  Family reports the patient has been refusing help at home, found to be significantly confused this morning.  Patient is confused but denies all complaints.   Past Medical History:  Diagnosis Date  . Aneurysm (HCC)   . Arthritis   . CHF (congestive heart failure) (HCC)   . Diabetes mellitus without complication (HCC)   . Heart murmur    as a child- 'nothing to worry about'  . Sleep apnea     Patient Active Problem List   Diagnosis Date Noted  . Tobacco use disorder 01/23/2017  . Hyperglycemia 04/02/2016  . Essential hypertension, benign 02/29/2016  . AAA (abdominal aortic aneurysm) without rupture (HCC) 02/29/2016  . Diabetes (HCC) 02/29/2016  . Abscess of right thigh 10/29/2012    Past Surgical History:  Procedure Laterality Date  . BUNIONECTOMY Right   . CARDIAC CATHETERIZATION  2010  . CATARACT EXTRACTION W/PHACO Right 06/05/2012   Procedure: CATARACT EXTRACTION PHACO AND INTRAOCULAR LENS PLACEMENT (IOC);  Surgeon: Shade FloodGreer Geiger, MD;  [facility];  Service: Ophthalmology;  Laterality: Right;  . Colonscopy  2012    Prior to Admission medications   Medication Sig Start Date End Date Taking? Authorizing Provider  aspirin 81 MG tablet Take 81 mg by mouth daily.    [provider]  azelastine (ASTELIN) 0.1 % nasal spray  01/02/17   [provider]  benzonatate (TESSALON) 100 MG capsule TK ONE C PO  TID PRN 12/23/16   [provider]  brompheniramine-pseudoephedrine-DM 30-2-10 MG/5ML syrup  01/02/17   [provider]  carvedilol (COREG) 3.125 MG tablet Take 3.125 mg by mouth 2 (two) times daily with a meal.    [provider]  DILT-XR 120 MG 24 hr capsule Take 120 mg by mouth daily.  12/19/15   [provider]  doxycycline (VIBRAMYCIN) 100 MG capsule Take 100 mg by mouth 2 (two) times daily. 01/16/17   [provider]  fluticasone (FLONASE) 50 MCG/ACT nasal spray SPR ONCE IEN  D 12/23/16   [provider]  HUMULIN 70/30 KWIKPEN (70-30) 100 UNIT/ML PEN  11/29/16   [provider]  hydrOXYzine (ATARAX/VISTARIL) 25 MG tablet TAKE 1 TO 2 TABLETS BY MOUTH AT BEDTIME IF NEEDED FOR ITCHING 01/08/17   [provider]  insulin lispro protamine-lispro (HUMALOG 75/25 MIX) (75-25) 100 UNIT/ML SUSP injection Inject 32 Units into the skin 2 (two) times daily with a meal. 30 morning and 25 at bedtime 04/04/16   Shaune Pollackhen, Qing, MD  montelukast (SINGULAIR) 10 MG tablet TK 1 T PO  D 12/23/16   [provider]  nitrofurantoin, macrocrystal-monohydrate, (MACROBID) 100 MG capsule take 1 capsule by mouth every 12 hours with food 01/19/17   [provider]  ONE TOUCH ULTRA TEST test strip  12/20/16   [provider]  sacubitril-valsartan (ENTRESTO) 24-26 MG Take by mouth. 05/08/16   [provider]  simvastatin (ZOCOR) 20 MG tablet Take 20 mg by mouth every evening.    [provider]  torsemide (DEMADEX) 100 MG tablet  12/20/16   [provider]  Allergies Sulfa antibiotics  Family History  Problem Relation Age of Onset  . CAD Mother   . Cancer Sister     Social History Social History   Tobacco Use  . Smoking status: Current Every Day Smoker    Packs/day: 0.50    Years: 40.00    Pack years: 20.00    Types: Cigarettes    Last attempt to quit: 06/02/2012    Years since quitting: 4.6  . Smokeless tobacco: Never Used  . Tobacco comment: smoked for many years.  Substance Use Topics  . Alcohol use: Yes      Comment: social  . Drug use: No    Level 5 caveat: Unable to obtain review of Systems due to altered mental status     ____________________________________________   PHYSICAL EXAM:  VITAL SIGNS: ED Triage Vitals  Enc Vitals Group     BP 02/09/17 1212 113/71     Pulse Rate 02/09/17 1212 (!) 108     Resp 02/09/17 1212 (!) 26     Temp 02/09/17 1212 (!) 103.3 F (39.6 C)     Temp Source 02/09/17 1212 Rectal     SpO2 02/09/17 1212 90 %     Weight 02/09/17 1216 104.3 kg (230 lb)     Height 02/09/17 1216 1.702 m (5\' 7" )     Head Circumference --      Peak Flow --      Pain Score --      Pain Loc --      Pain Edu? --      Excl. in GC? --     Constitutional: Alert but confused.  Ill-appearing  eyes: Conjunctivae are normal.  Head: Atraumatic. Nose: No epistaxis or swelling Mouth/Throat: Mucous membranes are moist.   Neck:  Painless ROM Cardiovascular: Tachycardia, regular rhythm. Grossly normal heart sounds.  Good peripheral circulation. Respiratory: Normal respiratory effort.  No retractions. Lungs CTAB. Gastrointestinal: Soft and nontender. No distention.  No CVA tenderness. Genitourinary: No erythema or crepitus, incontinent of urine Musculoskeletal: Well perfused extremities.  Lower legs with likely venous stasis ulcers, significant erythema surrounding left anterior leg ulcer very concerning for cellulitis Neurologic:  Normal speech and language. No gross focal neurologic deficits are appreciated.  Skin:  Skin is warm, dry.  Ulcerations and infection as above Psychiatric: Mood and affect are normal. Speech and behavior are normal.  ____________________________________________   LABS (all labs ordered are listed, but only abnormal results are displayed)  Labs Reviewed  COMPREHENSIVE METABOLIC PANEL - Abnormal; Notable for the following components:      Result Value   Chloride 100 (*)    Glucose, Bld 240 (*)    BUN 35 (*)    Creatinine, Ser 1.33 (*)     Albumin 3.2 (*)    AST 42 (*)    Total Bilirubin 3.0 (*)    GFR calc non Af Amer 53 (*)    All other components within normal limits  LACTIC ACID, PLASMA - Abnormal; Notable for the following components:   Lactic Acid, Venous 2.6 (*)    All other components within normal limits  CBC WITH DIFFERENTIAL/PLATELET - Abnormal; Notable for the following components:   WBC 12.9 (*)    HCT 52.6 (*)    RDW 16.1 (*)    Platelets 126 (*)    Neutro Abs 10.5 (*)    All other components within normal limits  PROTIME-INR - Abnormal; Notable for the following components:   Prothrombin Time 16.1 (*)  All other components within normal limits  CULTURE, BLOOD (ROUTINE X 2)  CULTURE, BLOOD (ROUTINE X 2)  URINE CULTURE  LACTIC ACID, PLASMA  URINALYSIS, COMPLETE (UACMP) WITH MICROSCOPIC  URINALYSIS, ROUTINE W REFLEX MICROSCOPIC  INFLUENZA PANEL BY PCR (TYPE A & B)   ____________________________________________  EKG  ED ECG REPORT I, Jene EveryKINNER, Keymon Mcelroy, the attending physician, personally viewed and interpreted this ECG.  Date: 02/09/2017  Rhythm: sinus tach QRS Axis: normal Intervals: prolonged QT ST/T Wave abnormalities: non specific changes Narrative Interpretation: no evidence of acute ischemia  ____________________________________________  RADIOLOGY  CT head unremarkable ____________________________________________   PROCEDURES  Procedure(s) performed: No  Procedures   Critical Care performed: yes  CRITICAL CARE Performed by: Jene EveryKINNER, Slyvia Lartigue   Total critical care time:30 minutes  Critical care time was exclusive of separately billable procedures and treating other patients.  Critical care was necessary to treat or prevent imminent or life-threatening deterioration.  Critical care was time spent personally by me on the following activities: development of treatment plan with patient and/or surrogate as well as nursing, discussions with consultants, evaluation of patient's  response to treatment, examination of patient, obtaining history from patient or surrogate, ordering and performing treatments and interventions, ordering and review of laboratory studies, ordering and review of radiographic studies, pulse oximetry and re-evaluation of patient's condition.  ____________________________________________   INITIAL IMPRESSION / ASSESSMENT AND PLAN / ED COURSE  Pertinent labs & imaging results that were available during my care of the patient were reviewed by me and considered in my medical decision making (see chart for details).  Patient presents with altered mental status.  He is febrile and tachycardic.  Exam is concerning for lower extremely cellulitis.  Strongly suspect metabolic encephalopathy secondary to sepsis.  We will call a code sepsis, start IV vancomycin and IV Zosyn and give IV fluids.  ----------------------------------------- 1:04 PM on 02/09/2017 -----------------------------------------  Pressure remains stable    Will admit to the hospitalist service for further management    ____________________________________________   FINAL CLINICAL IMPRESSION(S) / ED DIAGNOSES  Final diagnoses:  Sepsis, due to unspecified organism (HCC)  Cellulitis of left lower extremity  Metabolic encephalopathy        Note:  This document was prepared using Dragon voice recognition software and may include unintentional dictation errors.    Jene EveryKinner, Arabela Basaldua, MD 02/09/17 1331

## 2017-02-09 NOTE — Progress Notes (Signed)
Talked to Dr. Imogene Burnhen about patient's Lactic acid at 2.1 was 2.6. Also talked about patient's BP of 102/58, order to hold Coreg. Clarify about telemetry order being discontinue and per MD patient does not need to be on tele. Also notify if he needs to consult wound and per MD, he did consult wound team. No other concern at the moment. RN will continue to monitor.

## 2017-02-09 NOTE — Consult Note (Signed)
Patient Demographics  Jeremy Gutierrez, is a 66 y.o. male   MRN: [id number]   DOB - [birth year]  Admit Date - 02/09/2017    Outpatient Primary MD for the patient is Derwood Kaplan, MD  Consult requested in the Hospital by Shaune Pollack, MD, On 02/09/2017    Reason for consult ulcers on right and left lower legs also with swelling and redness to her feet ankles and lower legs.   With History of -  Past Medical History:  Diagnosis Date  . Aneurysm (HCC)   . Arthritis   . CHF (congestive heart failure) (HCC)   . Diabetes mellitus without complication (HCC)   . Heart murmur    as a child- 'nothing to worry about'  . Sleep apnea       Past Surgical History:  Procedure Laterality Date  . BUNIONECTOMY Right   . CARDIAC CATHETERIZATION  2010  . CATARACT EXTRACTION W/PHACO Right 06/05/2012   Procedure: CATARACT EXTRACTION PHACO AND INTRAOCULAR LENS PLACEMENT (IOC);  Surgeon: Shade Flood, MD;  Location: [facility] OR;  Service: Ophthalmology;  Laterality: Right;  . Colonscopy  2012    in for   Chief Complaint  Patient presents with  . Loss of Consciousness     HPI  Jeremy Gutierrez  is a 66 y.o. male, patient is in the hospital forProblems with a syncope episode he had in his doctor's office.  He was sent to the emergency room where he was evaluated and placed on antibiotics including vancomycin and Zosyn.  Did not have a CBC results as of yet.    Review of Systems    In addition to the HPI above,  No Fever-chills, No Headache, No changes with Vision or hearing, No problems swallowing food or Liquids, No Chest pain, Cough or Shortness of Breath, No Abdominal pain, No Nausea or Vommitting, Bowel movements are regular, No Blood in stool or Urine, No dysuria, No new skin rashes or bruises, No new joints  pains-aches,  No new weakness, tingling, numbness in any extremity, No recent weight gain or loss, No polyuria, polydypsia or polyphagia, No significant Mental Stressors.  A full 10 point Review of Systems was done, except as stated above, all other Review of Systems were negative.   Social History Social History   Tobacco Use  . Smoking status: Current Every Day Smoker    Packs/day: 0.50    Years: 40.00    Pack years: 20.00    Types: Cigarettes    Last attempt to quit: 06/02/2012    Years since quitting: 4.6  . Smokeless tobacco: Never Used  . Tobacco comment: smoked for many years.  Substance Use Topics  . Alcohol use: Yes    Comment: social    Family History Family History  Problem Relation Age of Onset  . CAD Mother   . Cancer Sister    Prior to Admission medications   Medication Sig Start Date End Date Taking? Authorizing Provider  aspirin 81 MG tablet Take 81 mg by mouth daily.   Yes [provider]  carvedilol (COREG) 3.125 MG tablet Take 3.125 mg by mouth 2 (two) times daily with a meal.   Yes  [provider]  doxycycline (VIBRAMYCIN) 100 MG capsule Take 100 mg by mouth 2 (two) times daily. 01/16/17  Yes [provider]  hydrOXYzine (ATARAX/VISTARIL) 25 MG tablet TAKE 1 TO 2 TABLETS BY MOUTH AT BEDTIME IF NEEDED FOR ITCHING 01/08/17  Yes [provider]  insulin lispro protamine-lispro (HUMALOG 75/25 MIX) (75-25) 100 UNIT/ML SUSP injection Inject 32 Units into the skin 2 (two) times daily with a meal. 30 morning and 25 at bedtime 04/04/16  Yes Shaune Pollackhen, Qing, MD  montelukast (SINGULAIR) 10 MG tablet TK 1 T PO  D 12/23/16  Yes [provider]  nitrofurantoin, macrocrystal-monohydrate, (MACROBID) 100 MG capsule take 1 capsule by mouth every 12 hours with food 01/19/17  Yes [provider]  sacubitril-valsartan (ENTRESTO) 24-26 MG Take by mouth. 05/08/16  Yes [provider]  simvastatin (ZOCOR) 20 MG tablet Take 20  mg by mouth every evening.   Yes [provider]  tamsulosin (FLOMAX) 0.4 MG CAPS capsule Take 0.4 mg by mouth daily. 01/24/17  Yes [provider]  torsemide (DEMADEX) 100 MG tablet  12/20/16  Yes [provider]  Vitamin D, Ergocalciferol, (DRISDOL) 50000 units CAPS capsule Take 1 capsule by mouth every 30 (thirty) days. 01/24/17  Yes [provider]  ONE TOUCH ULTRA TEST test strip  12/20/16   [provider]    Anti-infectives (From admission, onward)   Start     Dose/Rate Route Frequency Ordered Stop   02/09/17 2000  piperacillin-tazobactam (ZOSYN) IVPB 3.375 g     3.375 g 12.5 mL/hr over 240 Minutes Intravenous Every 8 hours 02/09/17 1326     02/09/17 1830  vancomycin (VANCOCIN) 1,500 mg in sodium chloride 0.9 % 500 mL IVPB  Status:  Discontinued     1,500 mg 250 mL/hr over 120 Minutes Intravenous Every 18 hours 02/09/17 1344 02/09/17 1351   02/09/17 1830  vancomycin (VANCOCIN) 1,250 mg in sodium chloride 0.9 % 250 mL IVPB     1,250 mg 166.7 mL/hr over 90 Minutes Intravenous Every 18 hours 02/09/17 1351     02/09/17 1215  piperacillin-tazobactam (ZOSYN) IVPB 3.375 g     3.375 g 100 mL/hr over 30 Minutes Intravenous  Once 02/09/17 1214 02/09/17 1346   02/09/17 1215  vancomycin (VANCOCIN) IVPB 1000 mg/200 mL premix     1,000 mg 200 mL/hr over 60 Minutes Intravenous  Once 02/09/17 1214 02/09/17 1347      Scheduled Meds: . aspirin EC  81 mg Oral Daily  . carvedilol  3.125 mg Oral BID WC  . [START ON 02/10/2017] diltiazem  120 mg Oral Daily  . heparin  5,000 Units Subcutaneous Q8H  . insulin aspart  0-5 Units Subcutaneous QHS  . insulin aspart  0-9 Units Subcutaneous TID WC  . insulin aspart protamine- aspart  30 Units Subcutaneous BID WC  . montelukast  10 mg Oral QHS  . sacubitril-valsartan  1 tablet Oral BID  . simvastatin  20 mg Oral QPM  . [START ON 02/10/2017] tamsulosin  0.4 mg Oral Daily  . [START ON 02/10/2017] torsemide  100  mg Oral Daily  . [START ON 02/10/2017] Vitamin D (Ergocalciferol)  50,000 Units Oral Q30 days   Continuous Infusions: . sodium chloride 50 mL/hr at 02/09/17 1703  . piperacillin-tazobactam (ZOSYN)  IV    . vancomycin 1,250 mg (02/09/17 1759)   PRN Meds:.acetaminophen **OR** acetaminophen, albuterol, bisacodyl, HYDROcodone-acetaminophen, hydrOXYzine, ketorolac, ondansetron **OR** ondansetron (ZOFRAN) IV, senna-docusate  Allergies  Allergen Reactions  . Sulfa  Antibiotics Hives    Patient states he is not allergic to this medication    Physical Exam  Vitals  Blood pressure (!) 102/58, pulse 94, temperature 100.2 F (37.9 C), temperature source Oral, resp. rate (!) 22, height 5\' 7"  (1.702 m), weight 104.3 kg (230 lb), SpO2 98 %.  Lower Extremity exam:  Vascular: Difficult to palpate both arteries DP and PT to either foot.  Patient has some erythema and redness to both feet and lower legs.  There is a 2+ pitting edema from mid calf all the way down into the dorsum of the foot.  Dermatological: Patient has 3 wounds to be on the right and one on the left.  On the right is got the one on the posterior Achilles area and one on the anterior tibial region.  On the left there is one on the anterior tibial region.  These were inspected today they are superficial and appear to be likely fluid blisters that developed secondary to the swelling in the region.  None of them look to be particularly deep or significantly infected with no drainage of anything but serous fluid.  Does not appear to have any neuropathic type ulcerations at this timeframe only the foot.  Neurological: Likely peripheral neuropathy.  Ortho: Patient has some hammertoe deformity on the second toe right foot otherwise unremarkable.  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  Under the care of Dr. Vennie Homansalderwood for cardiology as well as Dr. Barbara CowerJason due for peripheral vascular issues.  White count is elevated to little over 12. Wounds do not appear to be significantly infected.  I think he does have a lot of inflammation in his skin secondary to the edema and dermatitis and may have some superficial cellulitis going on as well.  Cardiac kidney function likely playing a role in peripheral edema as well.  Plan: Recommend to go ahead and getting vascular and cardiology consults for the patient.  Wounds already had some wraps on him so put mild compression wraps on both legs.  Tried to add some cushioning to the skin areas so that he would be less likely to get skin irritation from the compression wraps.  If his arterial vascular flow is decent then [employer] boots were probably the best option to get the swelling under control.  He also states he is planning on going out of town at the end of next week to fly to [place].  He needs to get these problems under control before he proceeds with that.  Active Problems:   Sepsis [facility](HCC)     Family Communication: Plan discussed with patient .  Epimenio Sarin M.D on 02/09/2017 at 6:23 PM  Thank you for the consult, we will follow the patient with you in the Hospital.

## 2017-02-09 NOTE — ED Triage Notes (Signed)
Pt arrived via EMS from doctors office. EMS reports pt had a syncopal episode that resulted in LOC. EMS reports pt hit his head during syncopal episode. Pt does not have a hx of seizures. Pt does have hx of CHF. Pt does not recall what happened to him. Pt is A&O x3 at this time.

## 2017-02-10 DIAGNOSIS — I878 Other specified disorders of veins: Secondary | ICD-10-CM

## 2017-02-10 DIAGNOSIS — R7881 Bacteremia: Secondary | ICD-10-CM

## 2017-02-10 LAB — CBC
HCT: 47.5 % (ref 40.0–52.0)
Hemoglobin: 15.7 g/dL (ref 13.0–18.0)
MCH: 32.5 pg (ref 26.0–34.0)
MCHC: 33 g/dL (ref 32.0–36.0)
MCV: 98.6 fL (ref 80.0–100.0)
PLATELETS: 106 10*3/uL — AB (ref 150–440)
RBC: 4.82 MIL/uL (ref 4.40–5.90)
RDW: 16 % — AB (ref 11.5–14.5)
WBC: 13.1 10*3/uL — AB (ref 3.8–10.6)

## 2017-02-10 LAB — BLOOD CULTURE ID PANEL (REFLEXED)
ACINETOBACTER BAUMANNII: NOT DETECTED
CANDIDA ALBICANS: NOT DETECTED
CANDIDA GLABRATA: NOT DETECTED
CANDIDA TROPICALIS: NOT DETECTED
Candida krusei: NOT DETECTED
Candida parapsilosis: NOT DETECTED
ENTEROBACTER CLOACAE COMPLEX: NOT DETECTED
ENTEROBACTERIACEAE SPECIES: NOT DETECTED
ENTEROCOCCUS SPECIES: NOT DETECTED
Escherichia coli: NOT DETECTED
HAEMOPHILUS INFLUENZAE: NOT DETECTED
Klebsiella oxytoca: NOT DETECTED
Klebsiella pneumoniae: NOT DETECTED
LISTERIA MONOCYTOGENES: NOT DETECTED
METHICILLIN RESISTANCE: DETECTED — AB
NEISSERIA MENINGITIDIS: NOT DETECTED
PSEUDOMONAS AERUGINOSA: NOT DETECTED
Proteus species: NOT DETECTED
STREPTOCOCCUS AGALACTIAE: NOT DETECTED
STREPTOCOCCUS PNEUMONIAE: NOT DETECTED
STREPTOCOCCUS PYOGENES: NOT DETECTED
STREPTOCOCCUS SPECIES: NOT DETECTED
Serratia marcescens: NOT DETECTED
Staphylococcus aureus (BCID): DETECTED — AB
Staphylococcus species: DETECTED — AB

## 2017-02-10 LAB — BASIC METABOLIC PANEL
Anion gap: 8 (ref 5–15)
BUN: 36 mg/dL — AB (ref 6–20)
CHLORIDE: 104 mmol/L (ref 101–111)
CO2: 24 mmol/L (ref 22–32)
CREATININE: 1.51 mg/dL — AB (ref 0.61–1.24)
Calcium: 8.4 mg/dL — ABNORMAL LOW (ref 8.9–10.3)
GFR, EST AFRICAN AMERICAN: 53 mL/min — AB (ref >60)
GFR, EST NON AFRICAN AMERICAN: 46 mL/min — AB (ref >60)
Glucose, Bld: 45 mg/dL — ABNORMAL LOW (ref 65–99)
POTASSIUM: 4.7 mmol/L (ref 3.5–5.1)
SODIUM: 136 mmol/L (ref 135–145)

## 2017-02-10 LAB — GLUCOSE, CAPILLARY
GLUCOSE-CAPILLARY: 37 mg/dL — AB (ref 65–99)
GLUCOSE-CAPILLARY: 57 mg/dL — AB (ref 65–99)
Glucose-Capillary: 79 mg/dL (ref 65–99)
Glucose-Capillary: 135 mg/dL — ABNORMAL HIGH (ref 65–99)
Glucose-Capillary: 223 mg/dL — ABNORMAL HIGH (ref 65–99)

## 2017-02-10 LAB — URINE CULTURE: Culture: NO GROWTH

## 2017-02-10 MED ORDER — INSULIN ASPART PROT & ASPART (70-30 MIX) 100 UNIT/ML ~~LOC~~ SUSP: 25.0000 [IU] | Freq: Two times a day (BID) | SUBCUTANEOUS | Status: DC

## 2017-02-10 MED ORDER — INSULIN ASPART PROT & ASPART (70-30 MIX) 100 UNIT/ML ~~LOC~~ SUSP
20.0000 [IU] | Freq: Two times a day (BID) | SUBCUTANEOUS | Status: DC
  Administered 2017-02-11: 20 [IU] via SUBCUTANEOUS
  Filled 2017-02-10: qty 1

## 2017-02-10 NOTE — Progress Notes (Signed)
PHARMACY - PHYSICIAN COMMUNICATION CRITICAL VALUE ALERT - BLOOD CULTURE IDENTIFICATION (BCID)  Jeremy Gutierrez is an 66 y.o. male who [facility]2017 with a chief complaint of LOC  Assessment:  Tmax 103.3, tachycardic, tachypneic, WBC 12.9, CT head shows sinusitis (include suspected source if known)  Name of physician (or Provider) Contacted: Pyreddy  Current antibiotics: Vancomycin  Changes to prescribed antibiotics recommended:  Patient is on recommended antibiotics - No changes needed  Results for orders placed or performed during the hospital encounter of 02/09/17  Blood Culture ID Panel (Reflexed) (Collected: 02/09/2017 12:07 PM)  Result Value Ref Range   Enterococcus species NOT DETECTED NOT DETECTED   Listeria monocytogenes NOT DETECTED NOT DETECTED   Staphylococcus species DETECTED (A) NOT DETECTED   Staphylococcus aureus DETECTED (A) NOT DETECTED   Methicillin resistance DETECTED (A) NOT DETECTED   Streptococcus species NOT DETECTED NOT DETECTED   Streptococcus agalactiae NOT DETECTED NOT DETECTED   Streptococcus pneumoniae NOT DETECTED NOT DETECTED   Streptococcus pyogenes NOT DETECTED NOT DETECTED   Acinetobacter baumannii NOT DETECTED NOT DETECTED   Enterobacteriaceae species NOT DETECTED NOT DETECTED   Enterobacter cloacae complex NOT DETECTED NOT DETECTED   Escherichia coli NOT DETECTED NOT DETECTED   Klebsiella oxytoca NOT DETECTED NOT DETECTED   Klebsiella pneumoniae NOT DETECTED NOT DETECTED   Proteus species NOT DETECTED NOT DETECTED   Serratia marcescens NOT DETECTED NOT DETECTED   Haemophilus influenzae NOT DETECTED NOT DETECTED   Neisseria meningitidis NOT DETECTED NOT DETECTED   Pseudomonas aeruginosa NOT DETECTED NOT DETECTED   Candida albicans NOT DETECTED NOT DETECTED   Candida glabrata NOT DETECTED NOT DETECTED   Candida krusei NOT DETECTED NOT DETECTED   Candida parapsilosis NOT DETECTED NOT DETECTED   Candida tropicalis NOT  DETECTED NOT DETECTED    Thomasene Rippleavid Pasqualino Witherspoon, PharmD, BCPS Clinical Pharmacist 02/10/2017

## 2017-02-10 NOTE — Consult Note (Signed)
Reason for Consult:Bilateral lower extremity ulcers and edema Referring Physician: Dr. Sharia Reeve is an 66 y.o. male.  HPI: Patient with DM; venous stasis; limited ambulation developed lower extremity ulcers and edema sometime ago- poor historian and sleepy on my exam. Denies claudication, although he does not ambulate much; denies rest pain  Past Medical History:  Diagnosis Date  . Aneurysm ([place])   . Arthritis   . CHF (congestive heart failure) ([place])   . Diabetes mellitus without complication ([place])   . Heart murmur    as a child- 'nothing to worry about'  . Sleep apnea     Past Surgical History:  Procedure Laterality Date  . BUNIONECTOMY Right   . CARDIAC CATHETERIZATION  2010  . CATARACT EXTRACTION W/PHACO Right 06/05/2012   Procedure: CATARACT EXTRACTION PHACO AND INTRAOCULAR LENS PLACEMENT (IOC);  Surgeon: Adonis Brook, MD;  Location: [facility];  Service: Ophthalmology;  Laterality: Right;  . Colonscopy  2012    Family History  Problem Relation Age of Onset  . CAD Mother   . Cancer Sister     Social History:  reports that he has been smoking cigarettes.  He has a 20.00 pack-year smoking history. he has never used smokeless tobacco. He reports that he drinks alcohol. He reports that he does not use drugs.  Allergies:  Allergies  Allergen Reactions  . Sulfa Antibiotics Hives    Patient states he is not allergic to this medication    Medications: I have reviewed the patient's current medications.  Results for orders placed or performed during the hospital encounter of 02/09/17 (from the past 48 hour(s))  Comprehensive metabolic panel     Status: Abnormal   Collection Time: 02/09/17 12:07 PM  Result Value Ref Range   Sodium 135 135 - 145 mmol/L   Potassium 4.7 3.5 - 5.1 mmol/L   Chloride 100 (L) 101 - 111 mmol/L   CO2 25 22 - 32 mmol/L   Glucose, Bld 240 (H) 65 - 99 mg/dL   BUN 35 (H) 6 - 20 mg/dL   Creatinine, Ser 1.33 (H) 0.61 - 1.24 mg/dL   Calcium 8.9  8.9 - 10.3 mg/dL   Total Protein 8.0 6.5 - 8.1 g/dL   Albumin 3.2 (L) 3.5 - 5.0 g/dL   AST 42 (H) 15 - 41 U/L   ALT 27 17 - 63 U/L   Alkaline Phosphatase 93 38 - 126 U/L   Total Bilirubin 3.0 (H) 0.3 - 1.2 mg/dL   GFR calc non Af Amer 53 (L) >60 mL/min   GFR calc Af Amer >60 >60 mL/min    Comment: (NOTE) The eGFR has been calculated using the CKD EPI equation. This calculation has not been validated in all clinical situations. eGFR's persistently <60 mL/min signify possible Chronic Kidney Disease.    Anion gap 10 5 - 15    Comment: Performed at [facility], [place], [place] [address]  Lactic acid, plasma     Status: Abnormal   Collection Time: 02/09/17 12:07 PM  Result Value Ref Range   Lactic Acid, Venous 2.6 (HH) 0.5 - 1.9 mmol/L    Comment: CRITICAL RESULT CALLED TO, READ BACK BY AND VERIFIED WITH DEJEA SCOTT 02/09/17 @ [place] Performed at [facility], [address] [place], [place] [address]   CBC with Differential     Status: Abnormal   Collection Time: 02/09/17 12:07 PM  Result Value Ref Range   WBC 12.9 (  H) 3.8 - 10.6 K/uL   RBC 5.38 4.40 - 5.90 MIL/uL   Hemoglobin 17.5 13.0 - 18.0 g/dL   HCT 52.6 (H) 40.0 - 52.0 %   MCV 97.8 80.0 - 100.0 fL   MCH 32.5 26.0 - 34.0 pg   MCHC 33.2 32.0 - 36.0 g/dL   RDW 16.1 (H) 11.5 - 14.5 %   Platelets 126 (L) 150 - 440 K/uL   Neutrophils Relative % 82 %   Neutro Abs 10.5 (H) 1.4 - 6.5 K/uL   Lymphocytes Relative 12 %   Lymphs Abs 1.5 1.0 - 3.6 K/uL   Monocytes Relative 6 %   Monocytes Absolute 0.8 0.2 - 1.0 K/uL   Eosinophils Relative 0 %   Eosinophils Absolute 0.0 0 - 0.7 K/uL   Basophils Relative 0 %   Basophils Absolute 0.0 0 - 0.1 K/uL    Comment: Performed at [facility], [address] [place], [place] [address]  Protime-INR     Status: Abnormal   Collection Time: 02/09/17 12:07 PM  Result Value Ref Range   Prothrombin Time 16.1 (H) 11.4 - 15.2 seconds   INR 1.30      Comment: Performed at [facility], [address] [place], [place] [address]  Culture, blood (Routine x 2)     Status: None (Preliminary result)   Collection Time: 02/09/17 12:07 PM  Result Value Ref Range   Specimen Description      BLOOD RIGHT HAND Performed at [facility], [address], [place], [place] [address]    Special Requests      BOTTLES DRAWN AEROBIC AND ANAEROBIC Blood Culture adequate volume Performed at [facility], [address] [place], [place] [address]    Culture  Setup Time      GRAM POSITIVE COCCI IN BOTH AEROBIC AND ANAEROBIC BOTTLES CRITICAL RESULT CALLED TO, READ BACK BY AND VERIFIED WITH: DAVID BESANTI 02/10/17 @ 5361  [place]    Culture GRAM POSITIVE COCCI    Report Status PENDING   Culture, blood (Routine x 2)     Status: None (Preliminary result)   Collection Time: 02/09/17 12:07 PM  Result Value Ref Range   Specimen Description BLOOD RAC    Special Requests      BOTTLES DRAWN AEROBIC AND ANAEROBIC Blood Culture results may not be optimal due to an excessive volume of blood received in culture bottles   Culture      NO GROWTH < 24 HOURS Performed at [facility], [address] [place], [place] [address]    Report Status PENDING   Urinalysis, Complete w Microscopic     Status: Abnormal   Collection Time: 02/09/17 12:07 PM  Result Value Ref Range   Color, Urine AMBER (A) YELLOW    Comment: BIOCHEMICALS MAY BE AFFECTED BY COLOR   APPearance CLEAR (A) CLEAR   Specific Gravity, Urine 1.016 1.005 - 1.030   pH 5.0 5.0 - 8.0   Glucose, UA 50 (A) NEGATIVE mg/dL   Hgb urine dipstick SMALL (A) NEGATIVE   Bilirubin Urine NEGATIVE NEGATIVE   Ketones, ur NEGATIVE NEGATIVE mg/dL   Protein, ur >=300 (A) NEGATIVE mg/dL   Nitrite NEGATIVE NEGATIVE   Leukocytes, UA NEGATIVE NEGATIVE   RBC / HPF 0-5 0 - 5 RBC/hpf   WBC, UA 6-30 0 - 5 WBC/hpf   Bacteria, UA NONE SEEN NONE SEEN   Squamous Epithelial / LPF NONE  SEEN NONE SEEN    Comment: Performed  at [facility], [address] [place], [place] [address]  Blood Culture ID Panel (Reflexed)     Status: Abnormal   Collection Time: 02/09/17 12:07 PM  Result Value Ref Range   Enterococcus species NOT DETECTED NOT DETECTED   Listeria monocytogenes NOT DETECTED NOT DETECTED   Staphylococcus species DETECTED (A) NOT DETECTED    Comment: CRITICAL RESULT CALLED TO, READ BACK BY AND VERIFIED WITH: DAVID BESANTI 02/10/17 @ 0712 MLK    Staphylococcus aureus DETECTED (A) NOT DETECTED    Comment: Methicillin (oxacillin)-resistant Staphylococcus aureus (MRSA). MRSA is predictably resistant to beta-lactam antibiotics (except ceftaroline). Preferred therapy is vancomycin unless clinically contraindicated. Patient requires contact precautions if  hospitalized. CRITICAL RESULT CALLED TO, READ BACK BY AND VERIFIED WITH: DAVID BESANTI 02/10/17 @ 0626  [place]    Methicillin resistance DETECTED (A) NOT DETECTED    Comment: CRITICAL RESULT CALLED TO, READ BACK BY AND VERIFIED WITH: DAVID BESANTI 02/10/17 @ 9485  [place]    Streptococcus species NOT DETECTED NOT DETECTED   Streptococcus agalactiae NOT DETECTED NOT DETECTED   Streptococcus pneumoniae NOT DETECTED NOT DETECTED   Streptococcus pyogenes NOT DETECTED NOT DETECTED   Acinetobacter baumannii NOT DETECTED NOT DETECTED   Enterobacteriaceae species NOT DETECTED NOT DETECTED   Enterobacter cloacae complex NOT DETECTED NOT DETECTED   Escherichia coli NOT DETECTED NOT DETECTED   Klebsiella oxytoca NOT DETECTED NOT DETECTED   Klebsiella pneumoniae NOT DETECTED NOT DETECTED   Proteus species NOT DETECTED NOT DETECTED   Serratia marcescens NOT DETECTED NOT DETECTED   Haemophilus influenzae NOT DETECTED NOT DETECTED   Neisseria meningitidis NOT DETECTED NOT DETECTED   Pseudomonas aeruginosa NOT DETECTED NOT DETECTED   Candida albicans NOT DETECTED NOT DETECTED   Candida glabrata NOT DETECTED NOT DETECTED    Candida krusei NOT DETECTED NOT DETECTED   Candida parapsilosis NOT DETECTED NOT DETECTED   Candida tropicalis NOT DETECTED NOT DETECTED    Comment: Performed at [facility], [address] [place], [place] [address]  Influenza panel by PCR (type A & B)     Status: None   Collection Time: 02/09/17  3:06 PM  Result Value Ref Range   Influenza A By PCR NEGATIVE NEGATIVE   Influenza B By PCR NEGATIVE NEGATIVE    Comment: (NOTE) The Xpert Xpress Flu assay is intended as an aid in the diagnosis of  influenza and should not be used as a sole basis for treatment.  This  assay is FDA approved for nasopharyngeal swab specimens only. Nasal  washings and aspirates are unacceptable for Xpert Xpress Flu testing. Performed at [facility], [address] [place], [place] [address]   Lactic acid, plasma     Status: Abnormal   Collection Time: 02/09/17  3:15 PM  Result Value Ref Range   Lactic Acid, Venous 2.1 (HH) 0.5 - 1.9 mmol/L    Comment: CRITICAL RESULT CALLED TO, READ BACK BY AND VERIFIED WITH NATASHA DELOURYES 02/09/17 @ 66  [place] Performed at [facility], [address] [place], [place] [address]   Creatinine, serum     Status: Abnormal   Collection Time: 02/09/17  4:40 PM  Result Value Ref Range   Creatinine, Ser 1.40 (H) 0.61 - 1.24 mg/dL   GFR calc non Af Amer 50 (L) >60 mL/min   GFR calc Af Amer 58 (L) >60 mL/min    Comment: (NOTE) The eGFR has been calculated using the CKD EPI equation. This calculation has not been  validated in all clinical situations. eGFR's persistently <60 mL/min signify possible Chronic Kidney Disease. Performed at [facility], [address] [place], [place] [address]   Procalcitonin     Status: None   Collection Time: 02/09/17  4:40 PM  Result Value Ref Range   Procalcitonin 4.09 ng/mL    Comment:        Interpretation: PCT > 2 ng/mL: Systemic infection (sepsis) is likely, unless other causes are  known. (NOTE)       Sepsis PCT Algorithm           Lower Respiratory Tract                                      Infection PCT Algorithm    ----------------------------     ----------------------------         PCT < 0.25 ng/mL                PCT < 0.10 ng/mL         Strongly encourage             Strongly discourage   discontinuation of antibiotics    initiation of antibiotics    ----------------------------     -----------------------------       PCT 0.25 - 0.50 ng/mL            PCT 0.10 - 0.25 ng/mL               OR       >80% decrease in PCT            Discourage initiation of                                            antibiotics      Encourage discontinuation           of antibiotics    ----------------------------     -----------------------------         PCT >= 0.50 ng/mL              PCT 0.26 - 0.50 ng/mL               AND       <80% decrease in PCT              Encourage initiation of                                             antibiotics       Encourage continuation           of antibiotics    ----------------------------     -----------------------------        PCT >= 0.50 ng/mL                  PCT > 0.50 ng/mL               AND         increase in PCT                  Strongly encourage  initiation of antibiotics    Strongly encourage escalation           of antibiotics                                     -----------------------------                                           PCT <= 0.25 ng/mL                                                 OR                                        > 80% decrease in PCT                                     Discontinue / Do not initiate                                             antibiotics Performed at [facility], [address] [place], [place] [address]   Hemoglobin A1c     Status: Abnormal   Collection Time: 02/09/17  4:40 PM  Result Value Ref Range   Hgb A1c MFr Bld 9.8 (H)  4.8 - 5.6 %    Comment: (NOTE) Pre diabetes:          5.7%-6.4% Diabetes:              >6.4% Glycemic control for   <7.0% adults with diabetes    Mean Plasma Glucose 234.56 mg/dL    Comment: Performed at [facility] [address] [place], [place] [address]  Glucose, capillary     Status: Abnormal   Collection Time: 02/09/17  4:42 PM  Result Value Ref Range   Glucose-Capillary 203 (H) 65 - 99 mg/dL   Comment 1 Notify RN    Comment 2 Document in Chart   Glucose, capillary     Status: Abnormal   Collection Time: 02/09/17  8:56 PM  Result Value Ref Range   Glucose-Capillary 226 (H) 65 - 99 mg/dL  Basic metabolic panel     Status: Abnormal   Collection Time: 02/10/17  6:59 AM  Result Value Ref Range   Sodium 136 135 - 145 mmol/L   Potassium 4.7 3.5 - 5.1 mmol/L   Chloride 104 101 - 111 mmol/L   CO2 24 22 - 32 mmol/L   Glucose, Bld 45 (L) 65 - 99 mg/dL   BUN 36 (H) 6 - 20 mg/dL   Creatinine, Ser 1.51 (H) 0.61 - 1.24 mg/dL   Calcium 8.4 (L) 8.9 - 10.3 mg/dL   GFR calc non Af Amer 46 (L) >60 mL/min   GFR calc Af Amer 53 (L) >60 mL/min    Comment: (NOTE) The eGFR has been calculated using the  CKD EPI equation. This calculation has not been validated in all clinical situations. eGFR's persistently <60 mL/min signify possible Chronic Kidney Disease.    Anion gap 8 5 - 15    Comment: Performed at [facility], [address] [place], [place] [address]  CBC     Status: Abnormal   Collection Time: 02/10/17  6:59 AM  Result Value Ref Range   WBC 13.1 (H) 3.8 - 10.6 K/uL   RBC 4.82 4.40 - 5.90 MIL/uL   Hemoglobin 15.7 13.0 - 18.0 g/dL   HCT 47.5 40.0 - 52.0 %   MCV 98.6 80.0 - 100.0 fL   MCH 32.5 26.0 - 34.0 pg   MCHC 33.0 32.0 - 36.0 g/dL   RDW 16.0 (H) 11.5 - 14.5 %   Platelets 106 (L) 150 - 440 K/uL    Comment: Performed at [facility], [address] [place], [place] [address]  Glucose, capillary     Status: Abnormal   Collection Time:  02/10/17  7:25 AM  Result Value Ref Range   Glucose-Capillary 37 (LL) 65 - 99 mg/dL   Comment 1 Notify RN   Glucose, capillary     Status: None   Collection Time: 02/10/17  8:42 AM  Result Value Ref Range   Glucose-Capillary 79 65 - 99 mg/dL    Dg Chest 2 View  Result Date: 02/09/2017 CLINICAL DATA:  Sepsis.  Syncopal episode today. EXAM: CHEST  2 VIEW COMPARISON:  04/02/2016 FINDINGS: Mild enlargement of the cardiopericardial silhouette, stable. No mediastinal or hilar masses. No evidence of adenopathy. Clear lungs.  No pleural effusion or pneumothorax. Skeletal structures are intact. IMPRESSION: 1. No acute cardiopulmonary disease. 2. Stable cardiomegaly. Electronically Signed   By: Lajean Manes M.D.   On: 02/09/2017 13:06   Ct Head Wo Contrast  Result Date: 02/09/2017 CLINICAL DATA:  EMS reports pt had a syncopal episode that resulted in LOC. EMS reports pt hit his head during syncopal episode. Pt does not have a hx of seizures. Pt does have hx of CHF. Pt does not recall what happened to him. EXAM: CT HEAD WITHOUT CONTRAST TECHNIQUE: Contiguous axial images were obtained from the base of the skull through the vertex without intravenous contrast. COMPARISON:  04/03/2016 FINDINGS: Brain: No evidence of acute infarction, hemorrhage, hydrocephalus, extra-axial collection or mass lesion/mass effect. Patchy white matter hypoattenuation noted consistent with moderate chronic microvascular ischemic change. Vascular: No hyperdense vessel or unexpected calcification. Skull: Normal. Negative for fracture or focal lesion. Sinuses/Orbits: Dependent fluid is noted in both maxillary sinuses. Mucosal thickening lines the ethmoid, sphenoid and maxillary sinuses. Mastoid air cells are clear. Other: None. IMPRESSION: 1. No acute intracranial abnormalities. 2. Significant sinus disease with bilateral maxillary air-fluid levels. Findings consistent with acute sinusitis in the proper clinical setting. Electronically  Signed   By: Lajean Manes M.D.   On: 02/09/2017 12:48    Review of Systems  Constitutional: Negative for chills and fever.  Cardiovascular: Positive for leg swelling. Negative for chest pain.  Musculoskeletal: Negative.   All other systems reviewed and are negative.  Blood pressure 98/62, pulse 88, temperature 98.6 F (37 C), temperature source Oral, resp. rate 18, height 5' 7"  (1.702 m), weight 104.3 kg (230 lb), SpO2 97 %. Physical Exam  Nursing note and vitals reviewed. Constitutional: He appears well-developed and well-nourished.  Cardiovascular: Normal rate and regular rhythm.  +Biphasic DP/PT pulses  Respiratory: No respiratory distress.  Musculoskeletal: He exhibits edema and tenderness.  Neurological: He  is alert.  Skin:  Ulcers on bilateral lower extremities- evidence of venous stasis    Assessment/Plan:  Venous Stasis Bilateral Lower Extremities; cellulitis. Ulcerations. No evidence of significant Arterial disease  Recommend Wound Care Consult. Local wound care. UNNA Boots.  No Surgical Intervention at this juncture.   Esco, Miechia A 02/10/2017, 9:56 AM

## 2017-02-10 NOTE — Progress Notes (Signed)
Vascular is seen patient today and is ordered a wound care consult for Unna boots to try to help heal these bilateral leg wounds and control the edema.  I am in complete agreement with this and I will sign off on care at this point.

## 2017-02-10 NOTE — Progress Notes (Signed)
[facility] - [place] [facility]   PATIENT NAME: Jeremy Gutierrez    MR#:  [id number]  DATE OF BIRTH:  06/20/1950  SUBJECTIVE:  CHIEF COMPLAINT:   Chief Complaint  Patient presents with  . Loss of Consciousness   Feels better. Poor PO intake  REVIEW OF SYSTEMS:    Review of Systems  Constitutional: Positive for malaise/fatigue. Negative for chills and fever.  HENT: Negative for sore throat.   Eyes: Negative for blurred vision, double vision and pain.  Respiratory: Negative for cough, hemoptysis, shortness of breath and wheezing.   Cardiovascular: Negative for chest pain, palpitations, orthopnea and leg swelling.  Gastrointestinal: Negative for abdominal pain, constipation, diarrhea, heartburn, nausea and vomiting.  Genitourinary: Negative for dysuria and hematuria.  Musculoskeletal: Negative for back pain and joint pain.  Skin: Negative for rash.  Neurological: Positive for weakness. Negative for sensory change, speech change, focal weakness and headaches.  Endo/Heme/Allergies: Does not bruise/bleed easily.  Psychiatric/Behavioral: Negative for depression. The patient is not nervous/anxious.     DRUG ALLERGIES:   Allergies  Allergen Reactions  . Sulfa Antibiotics Hives    Patient states he is not allergic to this medication    VITALS:  Blood pressure 98/62, pulse 88, temperature 98.6 F (37 C), temperature source Oral, resp. rate 18, height 5\' 7"  (1.702 m), weight 104.3 kg (230 lb), SpO2 97 %.  PHYSICAL EXAMINATION:   Physical Exam  GENERAL:  66 y.o.-year-old patient lying in the bed with no acute distress.  EYES: Pupils equal, round, reactive to light and accommodation. No scleral icterus. Extraocular muscles intact.  HEENT: Head atraumatic, normocephalic. Oropharynx and nasopharynx clear.  NECK:  Supple, no jugular venous distention. No thyroid enlargement, no tenderness.  LUNGS: Normal breath sounds bilaterally, no wheezing, rales, rhonchi. No use  of accessory muscles of respiration.  CARDIOVASCULAR: S1, S2 normal. No murmurs, rubs, or gallops.  ABDOMEN: Soft, nontender, nondistended. Bowel sounds present. No organomegaly or mass.  EXTREMITIES: B/L LE unna boots NEUROLOGIC: Cranial nerves II through XII are intact. No focal Motor or sensory deficits b/l.   PSYCHIATRIC: The patient is alert and awake SKIN: No obvious rash, lesion, or ulcer.   LABORATORY PANEL:   CBC Recent Labs  Lab 02/10/17 0659  WBC 13.1*  HGB 15.7  HCT 47.5  PLT 106*   ------------------------------------------------------------------------------------------------------------------ Chemistries  Recent Labs  Lab 02/09/17 1207  02/10/17 0659  NA 135  --  136  K 4.7  --  4.7  CL 100*  --  104  CO2 25  --  24  GLUCOSE 240*  --  45*  BUN 35*  --  36*  CREATININE 1.33*   < > 1.51*  CALCIUM 8.9  --  8.4*  AST 42*  --   --   ALT 27  --   --   ALKPHOS 93  --   --   BILITOT 3.0*  --   --    < > = values in this interval not displayed.   ------------------------------------------------------------------------------------------------------------------  Cardiac Enzymes No results for input(s): TROPONINI in the last 168 hours. ------------------------------------------------------------------------------------------------------------------  RADIOLOGY:  Dg Chest 2 View  Result Date: 02/09/2017 CLINICAL DATA:  Sepsis.  Syncopal episode today. EXAM: CHEST  2 VIEW COMPARISON:  04/02/2016 FINDINGS: Mild enlargement of the cardiopericardial silhouette, stable. No mediastinal or hilar masses. No evidence of adenopathy. Clear lungs.  No pleural effusion or pneumothorax. Skeletal structures are intact. IMPRESSION: 1. No acute cardiopulmonary disease. 2. Stable cardiomegaly. Electronically  Signed   By: Amie Portlandavid  Ormond M.D.   On: 02/09/2017 13:06   Ct Head Wo Contrast  Result Date: 02/09/2017 CLINICAL DATA:  EMS reports pt had a syncopal episode that resulted in  LOC. EMS reports pt hit his head during syncopal episode. Pt does not have a hx of seizures. Pt does have hx of CHF. Pt does not recall what happened to him. EXAM: CT HEAD WITHOUT CONTRAST TECHNIQUE: Contiguous axial images were obtained from the base of the skull through the vertex without intravenous contrast. COMPARISON:  04/03/2016 FINDINGS: Brain: No evidence of acute infarction, hemorrhage, hydrocephalus, extra-axial collection or mass lesion/mass effect. Patchy white matter hypoattenuation noted consistent with moderate chronic microvascular ischemic change. Vascular: No hyperdense vessel or unexpected calcification. Skull: Normal. Negative for fracture or focal lesion. Sinuses/Orbits: Dependent fluid is noted in both maxillary sinuses. Mucosal thickening lines the ethmoid, sphenoid and maxillary sinuses. Mastoid air cells are clear. Other: None. IMPRESSION: 1. No acute intracranial abnormalities. 2. Significant sinus disease with bilateral maxillary air-fluid levels. Findings consistent with acute sinusitis in the proper clinical setting. Electronically Signed   By: Amie Portlandavid  Ormond M.D.   On: 02/09/2017 12:48     ASSESSMENT AND PLAN:    * MRSA bacteremia due to b/l LE cellulitis and ulcers Start IV vancomycin Stop zosyn Repeat bcx. Check Echo Consult ID  * Acute metabolic encephalopathy due to above- resolved  * Uncontrolled diabetes mellitus with hypoglycemia Hold insulin today. Continue accuchecks  * Hypertension Hold medications  * Chronic diastolic CHF LV EF: 60% - 65%.  * CKD stage III.  Stable.  * Thrombocytopenia, mild Due to bacteremia likely Repeat in AM  All the records are reviewed and case discussed with Care Management/Social Worker Management plans discussed with the patient, family and they are in agreement.  CODE STATUS: FULL CODE  DVT Prophylaxis: SCDs  TOTAL TIME TAKING CARE OF THIS PATIENT: 35 minutes.   POSSIBLE D/C IN 3-4 DAYS, DEPENDING ON  CLINICAL CONDITION.  Molinda BailiffSrikar R Kalum Minner M.D on 02/10/2017 at 1:06 PM  Between 7am to 6pm - Pager - [phone]  After 6pm go to [email or web address] - password [email or web address]  [facility]  Office  [phone]  CC: Primary care physician; Derwood KaplanEason, Ernest B, MD  Note: This dictation was prepared with Dragon dictation along with smaller phrase technology. Any transcriptional errors that result from this process are unintentional.

## 2017-02-10 NOTE — Consult Note (Signed)
WOC Nurse wound consult note Reason for Consult: venous status ulcer bilateral lower extremities requiring bilateral unna boots Wound type: Venous status ulcers  Measurements:   Left anterior leg  7 x 5 x 0.1, 100% slough, scant serosanguinous drainage, no odor.  Right anterior leg 5 x 3 x 0.1, 80% slough, 20% granulation, serosanguinous drainage, no odor.  Right posterior leg 3.5 x 6 x 0.2, 80% slough, 20% granulation, serosanguinous drainage, no odor.  Periwound:edema, induration  Dressing procedure/placement/frequency: Cleansed legs with warm water and soap, applied two layer unna boot wrap.  Change 2 x week .  Elevate legs while in bed.  Pt also has two scabs, left great toe 1x1.5 dry necrotic tissue, right second toe .5x.5 scabbed over.   Henriette CombsSarah Gregg Holster RN CWOCN

## 2017-02-10 NOTE — Progress Notes (Signed)
ID telemedicine  Pt with MRSA bacteremia Will stop his zosyn repeat BCx sent in AM of 12-23 Needs cardiac echo (TEE preferred)

## 2017-02-11 ENCOUNTER — Inpatient Hospital Stay: Admit: 2017-02-11 | Payer: Medicare Other

## 2017-02-11 ENCOUNTER — Other Ambulatory Visit: Payer: Self-pay

## 2017-02-11 ENCOUNTER — Encounter: Payer: Self-pay | Admitting: Emergency Medicine

## 2017-02-11 ENCOUNTER — Inpatient Hospital Stay (HOSPITAL_COMMUNITY)
Admission: EM | Admit: 2017-02-11 | Discharge: 2017-02-18 | Disposition: A | Discharge status: Skilled Nursing Facility | Payer: Medicare Other | Source: Home / Self Care | Attending: Internal Medicine | Admitting: Internal Medicine

## 2017-02-11 DIAGNOSIS — R609 Edema, unspecified: Secondary | ICD-10-CM

## 2017-02-11 DIAGNOSIS — R7881 Bacteremia: Secondary | ICD-10-CM

## 2017-02-11 DIAGNOSIS — F0390 Unspecified dementia without behavioral disturbance: Secondary | ICD-10-CM

## 2017-02-11 DIAGNOSIS — L8990 Pressure ulcer of unspecified site, unspecified stage: Secondary | ICD-10-CM

## 2017-02-11 LAB — CBC WITH DIFFERENTIAL/PLATELET
BASOS ABS: 0 10*3/uL (ref 0–0.1)
Basophils Absolute: 0 10*3/uL (ref 0–0.1)
Basophils Relative: 0 %
Basophils Relative: 0 %
EOS ABS: 0 10*3/uL (ref 0–0.7)
EOS ABS: 0 10*3/uL (ref 0–0.7)
EOS PCT: 0 %
EOS PCT: 0 %
HCT: 47.5 % (ref 40.0–52.0)
HEMATOCRIT: 47.5 % (ref 40.0–52.0)
Hemoglobin: 15.3 g/dL (ref 13.0–18.0)
Hemoglobin: 16 g/dL (ref 13.0–18.0)
LYMPHS ABS: 1.4 10*3/uL (ref 1.0–3.6)
LYMPHS PCT: 12 %
LYMPHS PCT: 14 %
Lymphs Abs: 1.9 10*3/uL (ref 1.0–3.6)
MCH: 31.9 pg (ref 26.0–34.0)
MCH: 32.8 pg (ref 26.0–34.0)
MCHC: 32.2 g/dL (ref 32.0–36.0)
MCHC: 33.8 g/dL (ref 32.0–36.0)
MCV: 99.2 fL (ref 80.0–100.0)
MCV: 97.1 fL (ref 80.0–100.0)
MONOS PCT: 8 %
Monocytes Absolute: 0.9 10*3/uL (ref 0.2–1.0)
Monocytes Absolute: 1.1 10*3/uL — ABNORMAL HIGH (ref 0.2–1.0)
Monocytes Relative: 8 %
NEUTROS PCT: 78 %
Neutro Abs: 9.3 10*3/uL — ABNORMAL HIGH (ref 1.4–6.5)
Neutro Abs: 10.6 10*3/uL — ABNORMAL HIGH (ref 1.4–6.5)
Neutrophils Relative %: 80 %
PLATELETS: 129 10*3/uL — AB (ref 150–440)
Platelets: 112 10*3/uL — ABNORMAL LOW (ref 150–440)
RBC: 4.79 MIL/uL (ref 4.40–5.90)
RBC: 4.89 MIL/uL (ref 4.40–5.90)
RDW: 16.3 % — AB (ref 11.5–14.5)
RDW: 16.1 % — ABNORMAL HIGH (ref 11.5–14.5)
WBC: 11.7 10*3/uL — ABNORMAL HIGH (ref 3.8–10.6)
WBC: 13.7 10*3/uL — AB (ref 3.8–10.6)

## 2017-02-11 LAB — VANCOMYCIN, RANDOM: VANCOMYCIN RM: 18

## 2017-02-11 LAB — BASIC METABOLIC PANEL
ANION GAP: 7 (ref 5–15)
Anion gap: 10 (ref 5–15)
BUN: 40 mg/dL — AB (ref 6–20)
BUN: 42 mg/dL — ABNORMAL HIGH (ref 6–20)
CHLORIDE: 99 mmol/L — AB (ref 101–111)
CO2: 19 mmol/L — AB (ref 22–32)
CO2: 23 mmol/L (ref 22–32)
CREATININE: 1.53 mg/dL — AB (ref 0.61–1.24)
Calcium: 8 mg/dL — ABNORMAL LOW (ref 8.9–10.3)
Calcium: 8.3 mg/dL — ABNORMAL LOW (ref 8.9–10.3)
Chloride: 101 mmol/L (ref 101–111)
Creatinine, Ser: 1.58 mg/dL — ABNORMAL HIGH (ref 0.61–1.24)
GFR calc Af Amer: 52 mL/min — ABNORMAL LOW (ref >60)
GFR calc Af Amer: 50 mL/min — ABNORMAL LOW (ref >60)
GFR calc non Af Amer: 45 mL/min — ABNORMAL LOW (ref >60)
GFR, EST NON AFRICAN AMERICAN: 43 mL/min — AB (ref >60)
GLUCOSE: 316 mg/dL — AB (ref 65–99)
Glucose, Bld: 114 mg/dL — ABNORMAL HIGH (ref 65–99)
POTASSIUM: 4.6 mmol/L (ref 3.5–5.1)
Potassium: 5.1 mmol/L (ref 3.5–5.1)
SODIUM: 131 mmol/L — AB (ref 135–145)
Sodium: 128 mmol/L — ABNORMAL LOW (ref 135–145)

## 2017-02-11 LAB — GLUCOSE, CAPILLARY
GLUCOSE-CAPILLARY: 334 mg/dL — AB (ref 65–99)
GLUCOSE-CAPILLARY: 93 mg/dL (ref 65–99)

## 2017-02-11 LAB — SALICYLATE LEVEL: Salicylate Lvl: <7 mg/dL (ref 2.8–30.0)

## 2017-02-11 LAB — VANCOMYCIN, TROUGH: Vancomycin Tr: 35 ug/mL (ref 15–20)

## 2017-02-11 LAB — ACETAMINOPHEN LEVEL

## 2017-02-11 LAB — ETHANOL

## 2017-02-11 MED ORDER — VANCOMYCIN HCL 10 G IV SOLR
1750.0000 mg | INTRAVENOUS | Status: DC
  Filled 2017-02-11: qty 1750

## 2017-02-11 MED ORDER — VANCOMYCIN HCL IN DEXTROSE 1-5 GM/200ML-% IV SOLN: 1000.0000 mg | Freq: Once | INTRAVENOUS | Status: DC

## 2017-02-11 MED ORDER — PIPERACILLIN-TAZOBACTAM 3.375 G IVPB 30 MIN
3.3750 g | Freq: Once | INTRAVENOUS | Status: AC
  Administered 2017-02-11: 3.375 g via INTRAVENOUS
  Filled 2017-02-11: qty 50

## 2017-02-11 MED ORDER — VANCOMYCIN HCL 10 G IV SOLR
1250.0000 mg | Freq: Once | INTRAVENOUS | Status: AC
  Administered 2017-02-12: 1250 mg via INTRAVENOUS
  Filled 2017-02-11: qty 1250

## 2017-02-11 MED ORDER — DOXYCYCLINE HYCLATE 100 MG PO CAPS: 100.0000 mg | ORAL_CAPSULE | Freq: Two times a day (BID) | ORAL | 0 refills | Status: DC

## 2017-02-11 MED ORDER — SODIUM POLYSTYRENE SULFONATE 15 GM/60ML PO SUSP
15.0000 g | Freq: Once | ORAL | Status: DC
  Filled 2017-02-11: qty 60

## 2017-02-11 NOTE — ED Notes (Signed)
Call to [place], to update for pt status and room admission plan

## 2017-02-11 NOTE — ED Provider Notes (Signed)
[facility] Emergency Department Provider Note   ____________________________________________   I have reviewed the triage vital signs and the nursing notes.   HISTORY  Chief Complaint IVC  History limited by and level 5 caveat due to: Poor historian, history obtained from medical record and daughter   HPI Jeremy Gutierrez is a 66 y.o. male who presents to the emergency department today under IVC because of daughters concern that patient does not have capacity to take care of himself.  The patient had left the hospital earlier today after signing out AMA.  He was hospitalized for sepsis and MRSA bacteremia.  Apparently after getting back home he went on to the couch and did not move.  Daughter states that he was sitting in his own excrement.  The patient himself is quite confused as to why he was in the hospital and what occurred.  He does not seem to have a good sense of his illness.  The daughter did place him under IVC given concern for patient's inability to make decisions and care for himself.   Per medical record review patient has a history of admission for MRSA bacteremia.   Past Medical History:  Diagnosis Date  . Aneurysm (HCC)   . Arthritis   . CHF (congestive heart failure) (HCC)   . Diabetes mellitus without complication (HCC)   . Heart murmur    as a child- 'nothing to worry about'  . Sleep apnea     Patient Active Problem List   Diagnosis Date Noted  . Sepsis (HCC) 02/09/2017  . Tobacco use disorder 01/23/2017  . Hyperglycemia 04/02/2016  . Essential hypertension, benign 02/29/2016  . AAA (abdominal aortic aneurysm) without rupture (HCC) 02/29/2016  . Diabetes (HCC) 02/29/2016  . Abscess of right thigh 10/29/2012    Past Surgical History:  Procedure Laterality Date  . BUNIONECTOMY Right   . CARDIAC CATHETERIZATION  2010  . CATARACT EXTRACTION W/PHACO Right 06/05/2012   Procedure: CATARACT EXTRACTION PHACO AND INTRAOCULAR LENS  PLACEMENT (IOC);  Surgeon: Shade FloodGreer Geiger, MD;  [facility];  Service: Ophthalmology;  Laterality: Right;  . Colonscopy  2012    Prior to Admission medications   Medication Sig Start Date End Date Taking? Authorizing Provider  aspirin 81 MG tablet Take 81 mg by mouth daily.    [provider]  carvedilol (COREG) 3.125 MG tablet Take 3.125 mg by mouth 2 (two) times daily with a meal.    [provider]  doxycycline (VIBRAMYCIN) 100 MG capsule Take 1 capsule (100 mg total) by mouth 2 (two) times daily. 02/11/17   Milagros LollSudini, Srikar, MD  hydrOXYzine (ATARAX/VISTARIL) 25 MG tablet TAKE 1 TO 2 TABLETS BY MOUTH AT BEDTIME IF NEEDED FOR ITCHING 01/08/17   [provider]  insulin lispro protamine-lispro (HUMALOG 75/25 MIX) (75-25) 100 UNIT/ML SUSP injection Inject 32 Units into the skin 2 (two) times daily with a meal. 30 morning and 25 at bedtime 04/04/16   Shaune Pollackhen, Qing, MD  montelukast (SINGULAIR) 10 MG tablet TK 1 T PO  D 12/23/16   [provider]  nitrofurantoin, macrocrystal-monohydrate, (MACROBID) 100 MG capsule take 1 capsule by mouth every 12 hours with food 01/19/17   [provider]  ONE TOUCH ULTRA TEST test strip  12/20/16   [provider]  sacubitril-valsartan (ENTRESTO) 24-26 MG Take by mouth. 05/08/16   [provider]  simvastatin (ZOCOR) 20 MG tablet Take 20 mg by mouth every evening.    [provider]  tamsulosin (FLOMAX) 0.4 MG CAPS capsule Take 0.4 mg by mouth daily. 01/24/17   [provider]  torsemide (DEMADEX) 100 MG tablet  12/20/16   [provider]  Vitamin D, Ergocalciferol, (DRISDOL) 50000 units CAPS capsule Take 1 capsule by mouth every 30 (thirty) days. 01/24/17   [provider]    Allergies Sulfa antibiotics  Family History  Problem Relation Age of Onset  . CAD Mother   . Cancer Sister     Social History Social History   Tobacco Use  . Smoking status: Current Every  Day Smoker    Packs/day: 0.50    Years: 40.00    Pack years: 20.00    Types: Cigarettes    Last attempt to quit: 06/02/2012    Years since quitting: 4.6  . Smokeless tobacco: Never Used  . Tobacco comment: smoked for many years.  Substance Use Topics  . Alcohol use: Yes    Comment: social  . Drug use: No    Review of Systems  Unable to obtain reliable ROS secondary to medical condition.  ____________________________________________   PHYSICAL EXAM:  VITAL SIGNS: ED Triage Vitals  Enc Vitals Group     BP 132/89     Pulse 95     Resp 18     Temp 98.8     Temp src      SpO2 100   Constitutional: Awake and alert. Oriented to place. Not oriented to events.  Eyes: Conjunctivae are normal.  ENT   Head: Normocephalic and atraumatic.   Nose: No congestion/rhinnorhea.   Mouth/Throat: Mucous membranes are moist.   Neck: No stridor. Hematological/Lymphatic/Immunilogical: No cervical lymphadenopathy. Cardiovascular: Normal rate, regular rhythm.  No murmurs, rubs, or gallops.  Respiratory: Normal respiratory effort without tachypnea nor retractions. Breath sounds are clear and equal bilaterally. No wheezes/rales/rhonchi. Gastrointestinal: Soft and non tender. No rebound. No guarding.  Genitourinary: Deferred Musculoskeletal: Significant bilateral lower extremity edema.  Neurologic:  Not completely oriented. Normal speech and language. No gross focal neurologic deficits are appreciated.  Skin:  Wounds to lower extremity.  ____________________________________________    LABS (pertinent positives/negatives)  CBC wbc 13.7, hgb 16, plt 129 BMP na 131, cr 1.58, glu 114 Ethanol <10  ____________________________________________   EKG  None  ____________________________________________    RADIOLOGY  None  ____________________________________________   PROCEDURES  Procedures  ____________________________________________   INITIAL IMPRESSION /  ASSESSMENT AND PLAN / ED COURSE  Pertinent labs & imaging results that were available during my care of the patient were reviewed by me and considered in my medical decision making (see chart for details).  Patient presented to the emergency department today under IVC because of family's concern the patient is not able to care for himself nor does he appreciate these current medical condition.  Patient did discharged himself AMA from the hospital earlier today for MRSA bacteremia.  On my exam patient does not have a good sense of his medical condition.  He cannot give a good reason why he was in the hospital.  Nor can he give a good reason why he was sitting in his own excrement on the couch.  He states he can take care of himself however does not say why he would not then cleaned himself.  Psychiatrist on-call did evaluate the patient agrees that he does not have the capacity to make medical decisions at this time.  Patient will be admitted to the hospitalist service.  ______________________________________   FINAL CLINICAL IMPRESSION(S) / ED DIAGNOSES  Final diagnoses:  MRSA bacteremia  Peripheral edema     Note: This dictation was prepared with Dragon dictation. Any transcriptional errors that result from this process are unintentional     Phineas Semen, MD 02/11/17 2332

## 2017-02-11 NOTE — Progress Notes (Signed)
Educated patient on the importance of staying but pt still wants to leave.MD aware and spoke to patient. Patient still wants to leave after speaking with MD. Explained patient about signing AMA paper, he agreed and signed. Both IV's out. Patient was soiled so before wheeling him down, so he was cleaned. Pt transported via wheelchair.

## 2017-02-11 NOTE — Progress Notes (Signed)
Patient wants to leave today. Advised workup is underway and he continues to need IV abx. Patient adamant he wants to leave inspite of explaining this is a life threatening condition. I will start doxycycline 100 mg PO BID 14 days. Not ideal but patient wants to leave right away AMA.

## 2017-02-11 NOTE — Progress Notes (Signed)
ID telemed note Afebrile, Cr improved (though still not normal) on vanco.  WBC improved.  His repeat BCx were sent this AM.  Consider cardiac imaging (TTE vs TEE)

## 2017-02-11 NOTE — Progress Notes (Signed)
Pharmacy Antibiotic Note  Jeremy Gutierrez is a 66 y.o. male admitted on 02/09/2017 with MRSA bacteremia. Pharmacy has been consulted for vancomycin  Dosing.  Assessment: Patient was on vancomycin 1250 mg IV q18h. VT 12/23 @ 0601 = 35 mcg/mL HOWEVER vancomycin dose given at 0533. Therefore, is not a VT as dose was infusing.  Plan: With MRSA bacteremia, patient would likely be discharged on IV antibiotics and q18h regimen is not feasible. Will increase dose to vancomycin 1750 mg IV q24h May check vancomycin level prior to steady state to ensure not supratherapeutic given dose change if warranted.  Kinetics: Adjusted body weight = 81 kg Ke: 0.05 Half-life: 14.5 hrs Vd: 56 L Cmin (estimated) ~18.5 mcg/mL Patient is at risk of accumulation due to obesity.   Height: 5\' 7"  (170.2 cm) Weight: 230 lb (104.3 kg) IBW/kg (Calculated) : 66.1  Temp (24hrs), Avg:98.8 F (37.1 C), Min:98.5 F (36.9 C), Max:99.1 F (37.3 C)  Recent Labs  Lab 02/09/17 1207 02/09/17 1515 02/09/17 1640 02/10/17 0659 02/11/17 0601  WBC 12.9*  --   --  13.1* 11.7*  CREATININE 1.33*  --  1.40* 1.51* 1.53*  LATICACIDVEN 2.6* 2.1*  --   --   --   VANCOTROUGH  --   --   --   --  35*    Estimated Creatinine Clearance: 53.2 mL/min (A) (by C-G formula based on SCr of 1.53 mg/dL (H)).    Allergies  Allergen Reactions  . Sulfa Antibiotics Hives    Patient states he is not allergic to this medication    Antimicrobials this admission: 12/21 Vanc >>   Microbiology results: 12/21 BCx: MRSA bacteremia  Thank you for allowing pharmacy to be a part of this patient's care.  Cindi CarbonMary M Leonardo Makris, PharmD, BCPS Clinical Pharmacist 02/11/2017 9:23 AM

## 2017-02-11 NOTE — ED Notes (Signed)
SOC has been called for Telepsych consult.

## 2017-02-11 NOTE — ED Notes (Signed)
ABX therapy for pt:  Vancomycin 1250mg  q18h last given at 0533 12/23  Zosyn 3.375mg  q8h last given at [facility]0628 12/22

## 2017-02-11 NOTE — ED Notes (Signed)
Patient is IVC and is pending SOC report.

## 2017-02-11 NOTE — H&P (Signed)
[employer] [facility] [place] [facility]   PATIENT NAME: Jeremy Gutierrez    MR#:  [id number]  DATE OF BIRTH:  01/10/1951  DATE OF ADMISSION:  02/11/2017  PRIMARY CARE PHYSICIAN: Derwood Kaplan, MD   REQUESTING/REFERRING PHYSICIAN:   CHIEF COMPLAINT:   Chief Complaint  Patient presents with  . Medical Clearance  . Failure To Thrive    HISTORY OF PRESENT ILLNESS: Jeremy Gutierrez  is a 66 y.o. male with a known history per below, left the hospital AGAINST MEDICAL ADVICE earlier today-was in hospital for MRSA bacteremia secondary to bilateral lower extremity cellulitis/ulcerations, while at home patient was not able to take care of himself, patient was IVC by daughter as he could not take care of himself, telemetry psych ordered in the emergency room-patient declared incompetent, hospitalist asked to evaluate/admit for further care, patient evaluated in the emergency room, patient is confused/disoriented, patient is being readmitted for acute MRSA bacteremia secondary to bilateral lower extremity cellulitis/leg ulcerations.  PAST MEDICAL HISTORY:   Past Medical History:  Diagnosis Date  . Aneurysm (HCC)   . Arthritis   . CHF (congestive heart failure) (HCC)   . Diabetes mellitus without complication (HCC)   . Heart murmur    as a child- 'nothing to worry about'  . Sleep apnea     PAST SURGICAL HISTORY:  Past Surgical History:  Procedure Laterality Date  . BUNIONECTOMY Right   . CARDIAC CATHETERIZATION  2010  . CATARACT EXTRACTION W/PHACO Right 06/05/2012   Procedure: CATARACT EXTRACTION PHACO AND INTRAOCULAR LENS PLACEMENT (IOC);  Surgeon: Shade Flood, MD;  Location: [facility] OR;  Service: Ophthalmology;  Laterality: Right;  . Colonscopy  2012    SOCIAL HISTORY:  Social History   Tobacco Use  . Smoking status: Current Every Day Smoker    Packs/day: 0.50    Years: 40.00    Pack years: 20.00    Types: Cigarettes    Last attempt to quit: 06/02/2012    Years since  quitting: 4.6  . Smokeless tobacco: Never Used  . Tobacco comment: smoked for many years.  Substance Use Topics  . Alcohol use: Yes    Comment: social    FAMILY HISTORY:  Family History  Problem Relation Age of Onset  . CAD Mother   . Cancer Sister     DRUG ALLERGIES:  Allergies  Allergen Reactions  . Sulfa Antibiotics Hives    Patient states he is not allergic to this medication    REVIEW OF SYSTEMS:  Patient is confused/disoriented, poor historian  MEDICATIONS AT HOME:  Prior to Admission medications   Medication Sig Start Date End Date Taking? Authorizing Provider  aspirin 81 MG tablet Take 81 mg by mouth daily.   Yes [provider]  carvedilol (COREG) 3.125 MG tablet Take 3.125 mg by mouth 2 (two) times daily with a meal.   Yes [provider]  hydrOXYzine (ATARAX/VISTARIL) 25 MG tablet TAKE 1 TO 2 TABLETS BY MOUTH AT BEDTIME IF NEEDED FOR ITCHING 01/08/17  Yes [provider]  insulin lispro protamine-lispro (HUMALOG 75/25 MIX) (75-25) 100 UNIT/ML SUSP injection Inject 32 Units into the skin 2 (two) times daily with a meal. 30 morning and 25 at bedtime 04/04/16  Yes Shaune Pollack, MD  montelukast (SINGULAIR) 10 MG tablet TK 1 T PO  D 12/23/16  Yes [provider]  sacubitril-valsartan (ENTRESTO) 24-26 MG Take 1 tablet by mouth 2 (two) times daily.  05/08/16  Yes [provider]  simvastatin (ZOCOR) 20 MG tablet Take 20 mg by mouth every evening.   Yes [provider]  tamsulosin (FLOMAX) 0.4 MG CAPS capsule Take 0.4 mg by mouth daily. 01/24/17  Yes [provider]  torsemide (DEMADEX) 100 MG tablet Take 100 mg by mouth daily.  12/20/16  Yes [provider]  Vitamin D, Ergocalciferol, (DRISDOL) 50000 units CAPS capsule Take 1 capsule by mouth every 30 (thirty) days. 01/24/17  Yes [provider]  ONE TOUCH ULTRA TEST test strip  12/20/16   [provider]      PHYSICAL EXAMINATION:    VITAL SIGNS: Blood pressure 117/81, pulse 91, temperature 98.8 F (37.1 C), temperature source Oral, resp. rate 18, height 5\' 7"  (1.702 m), weight 104.3 kg (230 lb), SpO2 98 %.  GENERAL:  66 y.o.-year-old patient lying in the bed with no acute distress.  Obese, nontoxic-appearing EYES: Pupils equal, round, reactive to light and accommodation. No scleral icterus. Extraocular muscles intact.  HEENT: Head atraumatic, normocephalic. Oropharynx and nasopharynx clear.  NECK:  Supple, no jugular venous distention. No thyroid enlargement, no tenderness.  LUNGS: Normal breath sounds bilaterally, no wheezing, rales,rhonchi or crepitation. No use of accessory muscles of respiration.  CARDIOVASCULAR: S1, S2 normal. No murmurs, rubs, or gallops.  ABDOMEN: Soft, nontender, nondistended. Bowel sounds present. No organomegaly or mass.  EXTREMITIES: Bilateral lower extremity cellulitis with ulcerations noted, UNNA boots in place NEUROLOGIC: Cranial nerves II through XII are intact. MAES. Gait not checked.  PSYCHIATRIC: The patient is confused and disoriented, awake, alert  SKIN: No obvious rash, lesion, or ulcer.   LABORATORY PANEL:   CBC Recent Labs  Lab 02/09/17 1207 02/10/17 0659 02/11/17 0601 02/11/17 2041  WBC 12.9* 13.1* 11.7* 13.7*  HGB 17.5 15.7 15.3 16.0  HCT 52.6* 47.5 47.5 47.5  PLT 126* 106* 112* 129*  MCV 97.8 98.6 99.2 97.1  MCH 32.5 32.5 31.9 32.8  MCHC 33.2 33.0 32.2 33.8  RDW 16.1* 16.0* 16.3* 16.1*  LYMPHSABS 1.5  --  1.4 1.9  MONOABS 0.8  --  0.9 1.1*  EOSABS 0.0  --  0.0 0.0  BASOSABS 0.0  --  0.0 0.0   ------------------------------------------------------------------------------------------------------------------  Chemistries  Recent Labs  Lab 02/09/17 1207 02/09/17 1640 02/10/17 0659 02/11/17 0601 02/11/17 2041  NA 135  --  136 128* 131*  K 4.7  --  4.7 5.1 4.6  CL 100*  --  104 99* 101  CO2 25  --  24 19* 23  GLUCOSE 240*  --  45* 316* 114*  BUN 35*  --   36* 40* 42*  CREATININE 1.33* 1.40* 1.51* 1.53* 1.58*  CALCIUM 8.9  --  8.4* 8.0* 8.3*  AST 42*  --   --   --   --   ALT 27  --   --   --   --   ALKPHOS 93  --   --   --   --   BILITOT 3.0*  --   --   --   --    ------------------------------------------------------------------------------------------------------------------ estimated creatinine clearance is 51.5 mL/min (A) (by C-G formula based on SCr of 1.58 mg/dL (H)). ------------------------------------------------------------------------------------------------------------------ No results for input(s): TSH, T4TOTAL, T3FREE, THYROIDAB in the last 72 hours.  Invalid input(s): FREET3   Coagulation profile Recent Labs  Lab 02/09/17 1207  INR 1.30   ------------------------------------------------------------------------------------------------------------------- No results for input(s): DDIMER in the last 72 hours. -------------------------------------------------------------------------------------------------------------------  Cardiac Enzymes No results for input(s): CKMB, TROPONINI, MYOGLOBIN in the last  168 hours.  Invalid input(s): CK ------------------------------------------------------------------------------------------------------------------ Invalid input(s): POCBNP  ---------------------------------------------------------------------------------------------------------------  Urinalysis    Component Value Date/Time   COLORURINE AMBER (A) 02/09/2017 1207   APPEARANCEUR CLEAR (A) 02/09/2017 1207   LABSPEC 1.016 02/09/2017 1207   PHURINE 5.0 02/09/2017 1207   GLUCOSEU 50 (A) 02/09/2017 1207   HGBUR SMALL (A) 02/09/2017 1207   BILIRUBINUR NEGATIVE 02/09/2017 1207   KETONESUR NEGATIVE 02/09/2017 1207   PROTEINUR >=300 (A) 02/09/2017 1207   NITRITE NEGATIVE 02/09/2017 1207   LEUKOCYTESUR NEGATIVE 02/09/2017 1207     RADIOLOGY: No results found.  EKG: Orders placed or performed during the hospital  encounter of 04/02/16  . EKG 12-Lead  . EKG 12-Lead    IMPRESSION AND PLAN: 1 acute MRSA bacteremia due to b/l LE cellulitis and ulcers Continue IV vancomycin-dosing per pharmacy, consult ID for expert opinion, wound nurse to evaluate/treat   2 acute incompetence Status post IVC by daughter as patient cannot provide care for self, declared incompetent by telemetry psych-we will require Geri psych admission status post discharge once medically cleared for continued care  3 chronic uncontrolled diabetes mellitus  Continue home regiment, heart healthy/ADA diet, sliding scale insulin with Accu-Cheks per routine  4 Chronic benign essential hypertension4 Continue Coreg, hold Entresto, continue torsemide, vitals per routine, make changes as per necessary  5 chronic diastolic congestive heart failure  Normal ejection fraction  Continue torsemide, Coreg, aspirin, statin therapy, Entresto on hold for relatively low normal blood pressure-reconsiders restarting in the morning if blood pressure will allow   Full code Condition stable Prognosis fair DVT prophylaxis with heparin subcu Disposition possibly to [facility] : [facility] psych facility once medically cleared at time of discharge if deemed appropriate   All the records are reviewed and case discussed with ED provider. Management plans discussed with the patient, family and they are in agreement.  CODE STATUS: Code Status History    Date Active Date Inactive Code Status Order ID Comments User Context   02/09/2017 16:28 02/11/2017 12:29 Full Code [id number]  Shaune Pollack, MD Inpatient   04/02/2016 12:41 04/04/2016 22:49 Full Code [id number]  Arnaldo Natal, MD ED       TOTAL TIME TAKING CARE OF THIS PATIENT: 45 minutes.    Evelena Asa Makai Agostinelli M.D on 02/11/2017   Between 7am to 6pm - Pager - [phone]  After 6pm go to [email or web address] - password [email or web address]  [facility] [place]  Office  [phone]  CC: Primary care physician; Derwood Kaplan, MD   Note: This dictation was prepared with Dragon dictation along with smaller phrase technology. Any transcriptional errors that result from this process are unintentional.

## 2017-02-11 NOTE — ED Notes (Signed)
Reports [facility] Maric[facility] BoSprague[facility]

## 2017-02-11 NOTE — ED Triage Notes (Signed)
Patient presents to Emergency Department via EMS with complaints of IVC by daughter for not taking care of self.   Pt arrived with LEO and had stool in pants,. Pt is contentious stating "I can take care of myself", pt unable to stand or pivot without two person assistance.

## 2017-02-12 ENCOUNTER — Inpatient Hospital Stay (HOSPITAL_COMMUNITY)
Admit: 2017-02-12 | Discharge: 2017-02-12 | Disposition: A | Discharge status: Home / Self Care | Payer: Medicare Other | Attending: Internal Medicine | Admitting: Internal Medicine

## 2017-02-12 ENCOUNTER — Other Ambulatory Visit: Payer: Self-pay

## 2017-02-12 DIAGNOSIS — I340 Nonrheumatic mitral (valve) insufficiency: Secondary | ICD-10-CM

## 2017-02-12 LAB — GLUCOSE, CAPILLARY
GLUCOSE-CAPILLARY: 264 mg/dL — AB (ref 65–99)
GLUCOSE-CAPILLARY: 236 mg/dL — AB (ref 65–99)
GLUCOSE-CAPILLARY: 173 mg/dL — AB (ref 65–99)
GLUCOSE-CAPILLARY: 114 mg/dL — AB (ref 65–99)
Glucose-Capillary: 114 mg/dL — ABNORMAL HIGH (ref 65–99)
Glucose-Capillary: 329 mg/dL — ABNORMAL HIGH (ref 65–99)

## 2017-02-12 LAB — CBC
HEMATOCRIT: 49 % (ref 40.0–52.0)
HEMOGLOBIN: 16 g/dL (ref 13.0–18.0)
MCH: 32.5 pg (ref 26.0–34.0)
MCHC: 32.7 g/dL (ref 32.0–36.0)
MCV: 99.4 fL (ref 80.0–100.0)
Platelets: 116 10*3/uL — ABNORMAL LOW (ref 150–440)
RBC: 4.92 MIL/uL (ref 4.40–5.90)
RDW: 15.8 % — AB (ref 11.5–14.5)
WBC: 11.6 10*3/uL — AB (ref 3.8–10.6)

## 2017-02-12 LAB — ECHOCARDIOGRAM COMPLETE
Height: 67 in
WEIGHTICAEL: 3680 [oz_av]

## 2017-02-12 LAB — BASIC METABOLIC PANEL
ANION GAP: 9 (ref 5–15)
BUN: 41 mg/dL — AB (ref 6–20)
CHLORIDE: 101 mmol/L (ref 101–111)
CO2: 22 mmol/L (ref 22–32)
Calcium: 8.1 mg/dL — ABNORMAL LOW (ref 8.9–10.3)
Creatinine, Ser: 1.39 mg/dL — ABNORMAL HIGH (ref 0.61–1.24)
GFR calc Af Amer: 59 mL/min — ABNORMAL LOW (ref >60)
GFR calc non Af Amer: 51 mL/min — ABNORMAL LOW (ref >60)
GLUCOSE: 209 mg/dL — AB (ref 65–99)
POTASSIUM: 4.7 mmol/L (ref 3.5–5.1)
SODIUM: 132 mmol/L — AB (ref 135–145)

## 2017-02-12 MED ORDER — VITAMIN D (ERGOCALCIFEROL) 1.25 MG (50000 UNIT) PO CAPS
50000.0000 [IU] | ORAL_CAPSULE | ORAL | Status: DC
  Administered 2017-02-12: 50000 [IU] via ORAL
  Filled 2017-02-12: qty 1

## 2017-02-12 MED ORDER — INSULIN ASPART 100 UNIT/ML ~~LOC~~ SOLN
0.0000 [IU] | Freq: Every day | SUBCUTANEOUS | Status: DC
  Administered 2017-02-14: 22:00:00 4 [IU] via SUBCUTANEOUS
  Administered 2017-02-15 – 2017-02-17 (×2): 2 [IU] via SUBCUTANEOUS
  Filled 2017-02-12 (×4): qty 1

## 2017-02-12 MED ORDER — ASPIRIN EC 81 MG PO TBEC
81.0000 mg | DELAYED_RELEASE_TABLET | Freq: Every day | ORAL | Status: DC
  Administered 2017-02-12 – 2017-02-18 (×6): 81 mg via ORAL
  Filled 2017-02-12 (×7): qty 1

## 2017-02-12 MED ORDER — SODIUM CHLORIDE 0.9 % IV SOLN
INTRAVENOUS | Status: DC
  Administered 2017-02-12: 02:00:00 via INTRAVENOUS

## 2017-02-12 MED ORDER — SIMVASTATIN 20 MG PO TABS
20.0000 mg | ORAL_TABLET | Freq: Every evening | ORAL | Status: DC
  Administered 2017-02-12 – 2017-02-17 (×6): 20 mg via ORAL
  Filled 2017-02-12 (×6): qty 1

## 2017-02-12 MED ORDER — ACETAMINOPHEN 325 MG PO TABS
650.0000 mg | ORAL_TABLET | Freq: Four times a day (QID) | ORAL | Status: DC | PRN
  Administered 2017-02-13: 650 mg via ORAL
  Filled 2017-02-12: qty 2

## 2017-02-12 MED ORDER — INSULIN ASPART PROT & ASPART (70-30 MIX) 100 UNIT/ML ~~LOC~~ SUSP
32.0000 [IU] | Freq: Two times a day (BID) | SUBCUTANEOUS | Status: DC
  Administered 2017-02-12 (×2): 32 [IU] via SUBCUTANEOUS
  Filled 2017-02-12 (×2): qty 1

## 2017-02-12 MED ORDER — TAMSULOSIN HCL 0.4 MG PO CAPS
0.4000 mg | ORAL_CAPSULE | Freq: Every day | ORAL | Status: DC
Start: 2017-02-12 — End: 2017-02-18
  Administered 2017-02-12 – 2017-02-18 (×6): 0.4 mg via ORAL
  Filled 2017-02-12 (×7): qty 1

## 2017-02-12 MED ORDER — ACETAMINOPHEN 650 MG RE SUPP: 650.0000 mg | Freq: Four times a day (QID) | RECTAL | Status: DC | PRN

## 2017-02-12 MED ORDER — TORSEMIDE 20 MG PO TABS
100.0000 mg | ORAL_TABLET | Freq: Every day | ORAL | Status: DC
  Administered 2017-02-12 – 2017-02-13 (×2): 100 mg via ORAL
  Filled 2017-02-12 (×4): qty 5

## 2017-02-12 MED ORDER — HEPARIN SODIUM (PORCINE) 5000 UNIT/ML IJ SOLN
5000.0000 [IU] | Freq: Three times a day (TID) | INTRAMUSCULAR | Status: DC
  Administered 2017-02-12 – 2017-02-13 (×7): 5000 [IU] via SUBCUTANEOUS
  Filled 2017-02-12 (×8): qty 1

## 2017-02-12 MED ORDER — ADULT MULTIVITAMIN W/MINERALS CH
1.0000 | ORAL_TABLET | Freq: Every day | ORAL | Status: DC
  Administered 2017-02-12 – 2017-02-18 (×6): 1 via ORAL
  Filled 2017-02-12 (×7): qty 1

## 2017-02-12 MED ORDER — INSULIN ASPART 100 UNIT/ML ~~LOC~~ SOLN
0.0000 [IU] | Freq: Three times a day (TID) | SUBCUTANEOUS | Status: DC
  Administered 2017-02-12: 12:00:00 11 [IU] via SUBCUTANEOUS
  Administered 2017-02-12: 8 [IU] via SUBCUTANEOUS
  Administered 2017-02-12 – 2017-02-13 (×3): 3 [IU] via SUBCUTANEOUS
  Administered 2017-02-14: 2 [IU] via SUBCUTANEOUS
  Administered 2017-02-14 – 2017-02-15 (×2): 5 [IU] via SUBCUTANEOUS
  Administered 2017-02-15: 8 [IU] via SUBCUTANEOUS
  Administered 2017-02-15: 11 [IU] via SUBCUTANEOUS
  Administered 2017-02-16 (×2): 3 [IU] via SUBCUTANEOUS
  Administered 2017-02-16 – 2017-02-17 (×2): 5 [IU] via SUBCUTANEOUS
  Administered 2017-02-17: 09:00:00 3 [IU] via SUBCUTANEOUS
  Administered 2017-02-17 – 2017-02-18 (×3): 5 [IU] via SUBCUTANEOUS
  Filled 2017-02-12 (×18): qty 1

## 2017-02-12 MED ORDER — MONTELUKAST SODIUM 10 MG PO TABS
10.0000 mg | ORAL_TABLET | Freq: Every day | ORAL | Status: DC
Start: 2017-02-12 — End: 2017-02-18
  Administered 2017-02-12 – 2017-02-17 (×7): 10 mg via ORAL
  Filled 2017-02-12 (×7): qty 1

## 2017-02-12 MED ORDER — ONDANSETRON HCL 4 MG PO TABS: 4.0000 mg | ORAL_TABLET | Freq: Four times a day (QID) | ORAL | Status: DC | PRN

## 2017-02-12 MED ORDER — SODIUM CHLORIDE 0.9% FLUSH
3.0000 mL | Freq: Two times a day (BID) | INTRAVENOUS | Status: DC
  Administered 2017-02-12 – 2017-02-15 (×6): 3 mL via INTRAVENOUS

## 2017-02-12 MED ORDER — HYDROCODONE-ACETAMINOPHEN 5-325 MG PO TABS
1.0000 | ORAL_TABLET | ORAL | Status: DC | PRN
  Administered 2017-02-12: 2 via ORAL
  Filled 2017-02-12: qty 2

## 2017-02-12 MED ORDER — CARVEDILOL 3.125 MG PO TABS
3.1250 mg | ORAL_TABLET | Freq: Two times a day (BID) | ORAL | Status: DC
  Administered 2017-02-12 – 2017-02-18 (×11): 3.125 mg via ORAL
  Filled 2017-02-12 (×12): qty 1

## 2017-02-12 MED ORDER — ONDANSETRON HCL 4 MG/2ML IJ SOLN: 4.0000 mg | Freq: Four times a day (QID) | INTRAMUSCULAR | Status: DC | PRN

## 2017-02-12 MED ORDER — VANCOMYCIN HCL IN DEXTROSE 750-5 MG/150ML-% IV SOLN
750.0000 mg | Freq: Two times a day (BID) | INTRAVENOUS | Status: DC
  Administered 2017-02-12 – 2017-02-13 (×3): 750 mg via INTRAVENOUS
  Filled 2017-02-12 (×5): qty 150

## 2017-02-12 MED ORDER — HYDROXYZINE HCL 25 MG PO TABS
25.0000 mg | ORAL_TABLET | Freq: Three times a day (TID) | ORAL | Status: DC | PRN
  Filled 2017-02-12: qty 1

## 2017-02-12 NOTE — Progress Notes (Signed)
Pharmacy Antibiotic Note  Jeremy Gutierrez is a 66 y.o. male re-admitted on 02/11/2017 with MRSA bacteremia.  Pharmacy has been consulted for vancomycin dosing.  Plan: Patient was previously here earlier in the day and was receiving vanc 1.25g IV q18h. Trough was drawn after dose was running and was 35 mcg/mL; however, vanc was discontinue because Scr was rising. Random level was drawn when patient readmitted: 12/23 @ 2041 VR 18 and patient was given vanc 1.25g IV x 1 in the ED  Ke 0.047 T1/2 14 ~ 12 hrs Vd 57L AjBW 81.4 kg CrCl 51.5 ml/min  In 12 hrs patient will be at approximately 20 mcg/mL -- Will start patient on vanc 750 mg IV q12h. Will check a vanc trough 12/26 @ 0000 prior to 4th dose. Css 18 mcg/mL Will draw vanc trough sooner if renal function declines further -- Scr 1.33 >> 1.40 >> 1.51 >> 1.53 >> 1.58  Height: 5\' 7"  (170.2 cm) Weight: 230 lb (104.3 kg) IBW/kg (Calculated) : 66.1  Temp (24hrs), Avg:98.4 F (36.9 C), Min:98 F (36.7 C), Max:98.8 F (37.1 C)  Recent Labs  Lab 02/09/17 1207 02/09/17 1515 02/09/17 1640 02/10/17 0659 02/11/17 0601 02/11/17 2041  WBC 12.9*  --   --  13.1* 11.7* 13.7*  CREATININE 1.33*  --  1.40* 1.51* 1.53* 1.58*  LATICACIDVEN 2.6* 2.1*  --   --   --   --   VANCOTROUGH  --   --   --   --  35*  --   VANCORANDOM  --   --   --   --   --  18    Estimated Creatinine Clearance: 51.5 mL/min (A) (by C-G formula based on SCr of 1.58 mg/dL (H)).    Allergies  Allergen Reactions  . Sulfa Antibiotics Hives    Patient states he is not allergic to this medication    Thank you for allowing pharmacy to be a part of this patient's care.  Thomasene Rippleavid Odis Turck, PharmD, BCPS Clinical Pharmacist 02/12/2017

## 2017-02-12 NOTE — ED Notes (Signed)
Call to [place], pt's daughter, [phone] (cell), reports that she IVC'd pt d/t pt not taking care of self (pt sitting in own urine and stool), left hospital early prior to treatment finished, not oriented to care plan.  Same reports she want pt to either get nursing aid to stay at home with pt or for pt to be admitted to a facility that can take care of pt

## 2017-02-12 NOTE — Progress Notes (Signed)
Patient ID: Jeremy Gutierrez, male   DOB: 11/15/1950, 66 y.o.   MRN: [id number]          [facility]    Date of Admission:  02/11/2017   Day 4 vancomycin         [name] has MRSA bacteremia due to lower extremity cellulitis and ulcerations.  He left AGAINST MEDICAL ADVICE yesterday but is now readmitted after being involuntarily committed by his daughter.  Repeat blood cultures are negative at 24 hours.  TTE is pending.  I would continue vancomycin.  I would recommend holding off on PICC placement until we know repeat blood cultures remain negative for at least 3 days.  Optimal duration of therapy will depend on goals of care, results of blood cultures and echocardiography.  This is a remote consultation.  I will follow up on 02/14/2017.         Cliffton AstersJohn Rana Adorno, MD [facility] [phone] pager   [phone] cell 02/12/2017, 9:30 AM

## 2017-02-12 NOTE — Consult Note (Signed)
WOC Nurse wound consult note Reason for Consult:  Patient was seen for cellulitis to bilateral lower legs and wrapped with Unnas boots and topical wound care on 02/10/17 PM.  Patient was discharged and has returned.  Wraps remain intact and will be changed 02/14/17 per previous order.    Wound type: Venous status ulcers  Measurements: obtained 02/10/17  Left anterior leg  7 x 5 x 0.1, 100% slough, scant serosanguinous drainage, no odor.  Right anterior leg 5 x 3 x 0.1, 80% slough, 20% granulation, serosanguinous drainage, no odor.  Right posterior leg 3.5 x 6 x 0.2, 80% slough, 20% granulation, serosanguinous drainage, no odor.  Periwound:edema, induration  Dressing procedure/placement/frequency: Cleansed legs with warm water and soap, applied two layer unna boot wrap.  Change 2 x week .  Elevate legs while in bed.  Pt also has two scabs, left great toe 1x1.5 dry necrotic tissue, right second toe .5x.5 scabbed over.   WOC team will follow Maple HudsonKaren Lenny Fiumara RN BSN [facility] Pager [phone]

## 2017-02-12 NOTE — ED Notes (Signed)
Report call att, delay d/t this RN running to CODE BLUE in room 212 with Dr York CeriseForbach

## 2017-02-12 NOTE — Progress Notes (Signed)
[facility] - [place] at [facility]   PATIENT NAME: Jeremy Gutierrez    MR#:  [id number]  DATE OF BIRTH:  10/09/1950  SUBJECTIVE:  CHIEF COMPLAINT:   Chief Complaint  Patient presents with  . Medical Clearance  . Failure To Thrive   Feels well. Afebrile  IVC in place. Sitter at bedside  REVIEW OF SYSTEMS:    Review of Systems  Constitutional: Positive for malaise/fatigue. Negative for chills and fever.  HENT: Negative for sore throat.   Eyes: Negative for blurred vision, double vision and pain.  Respiratory: Negative for cough, hemoptysis, shortness of breath and wheezing.   Cardiovascular: Negative for chest pain, palpitations, orthopnea and leg swelling.  Gastrointestinal: Negative for abdominal pain, constipation, diarrhea, heartburn, nausea and vomiting.  Genitourinary: Negative for dysuria and hematuria.  Musculoskeletal: Negative for back pain and joint pain.  Skin: Negative for rash.  Neurological: Positive for weakness. Negative for sensory change, speech change, focal weakness and headaches.  Endo/Heme/Allergies: Does not bruise/bleed easily.  Psychiatric/Behavioral: Negative for depression. The patient is not nervous/anxious.     DRUG ALLERGIES:   Allergies  Allergen Reactions  . Sulfa Antibiotics Hives    Patient states he is not allergic to this medication    VITALS:  Blood pressure 113/65, pulse 92, temperature 98.4 F (36.9 C), temperature source Oral, resp. rate 20, height 5\' 7"  (1.702 m), weight 104.3 kg (230 lb), SpO2 100 %.  PHYSICAL EXAMINATION:   Physical Exam  GENERAL:  66 y.o.-year-old patient lying in the bed with no acute distress.  EYES: Pupils equal, round, reactive to light and accommodation. No scleral icterus. Extraocular muscles intact.  HEENT: Head atraumatic, normocephalic. Oropharynx and nasopharynx clear.  NECK:  Supple, no jugular venous distention. No thyroid enlargement, no tenderness.  LUNGS: Normal breath sounds  bilaterally, no wheezing, rales, rhonchi. No use of accessory muscles of respiration.  CARDIOVASCULAR: S1, S2 normal. No murmurs, rubs, or gallops.  ABDOMEN: Soft, nontender, nondistended. Bowel sounds present. No organomegaly or mass.  EXTREMITIES: Gutierrez/L LE unna boots NEUROLOGIC: Cranial nerves II through XII are intact. No focal Motor or sensory deficits Gutierrez/l.   PSYCHIATRIC: The patient is alert and awake SKIN: No obvious rash, lesion, or ulcer.   LABORATORY PANEL:   CBC Recent Labs  Lab 02/12/17 0415  WBC 11.6*  HGB 16.0  HCT 49.0  PLT 116*   ------------------------------------------------------------------------------------------------------------------ Chemistries  Recent Labs  Lab 02/09/17 1207  02/12/17 0415  NA 135   < > 132*  K 4.7   < > 4.7  CL 100*   < > 101  CO2 25   < > 22  GLUCOSE 240*   < > 209*  BUN 35*   < > 41*  CREATININE 1.33*   < > 1.39*  CALCIUM 8.9   < > 8.1*  AST 42*  --   --   ALT 27  --   --   ALKPHOS 93  --   --   BILITOT 3.0*  --   --    < > = values in this interval not displayed.   ------------------------------------------------------------------------------------------------------------------  Cardiac Enzymes No results for input(s): TROPONINI in the last 168 hours. ------------------------------------------------------------------------------------------------------------------  RADIOLOGY:  No results found.   ASSESSMENT AND PLAN:    * MRSA bacteremia due to Gutierrez/l LE cellulitis and ulcers Started IV vancomycin Repeat bcx NGTD. Check Echo Consulted ID  * Acute metabolic encephalopathy due to above  * Uncontrolled diabetes mellitus with hyperglycemia Restart  insulin  * Hypertension Hold medications  * Chronic diastolic CHF LV EF: 60% - 65%. On torsemide  * CKD stage III.  Stable.  * Thrombocytopenia, mild Due to bacteremia likely No bleeding  All the records are reviewed and case discussed with Care  Management/Social Worker Management plans discussed with the patient, family and they are in agreement.  CODE STATUS: FULL CODE  DVT Prophylaxis: SCDs  TOTAL TIME TAKING CARE OF THIS PATIENT: 35 minutes.   POSSIBLE D/C IN 3-4 DAYS, DEPENDING ON CLINICAL CONDITION.  Jeremy Gutierrez M.D on 02/12/2017 at 12:24 PM  Between 7am to 6pm - Pager - [phone]  After 6pm go to [email or web address] - password [email or web address] [facility]  [facility] [place] [facility]  Office  [phone]  CC: Primary care physician; Jeremy Gutierrez, Jeremy B, MD  Note: This dictation was prepared with Dragon dictation along with smaller phrase technology. Any transcriptional errors that result from this process are unintentional.

## 2017-02-13 DIAGNOSIS — L8990 Pressure ulcer of unspecified site, unspecified stage: Secondary | ICD-10-CM

## 2017-02-13 LAB — CULTURE, BLOOD (ROUTINE X 2): Special Requests: ADEQUATE

## 2017-02-13 LAB — GLUCOSE, CAPILLARY
GLUCOSE-CAPILLARY: 43 mg/dL — AB (ref 65–99)
GLUCOSE-CAPILLARY: 154 mg/dL — AB (ref 65–99)
GLUCOSE-CAPILLARY: 195 mg/dL — AB (ref 65–99)
GLUCOSE-CAPILLARY: 187 mg/dL — AB (ref 65–99)
Glucose-Capillary: 72 mg/dL (ref 65–99)
Glucose-Capillary: 38 mg/dL — CL (ref 65–99)
Glucose-Capillary: 46 mg/dL — ABNORMAL LOW (ref 65–99)
Glucose-Capillary: 56 mg/dL — ABNORMAL LOW (ref 65–99)
Glucose-Capillary: 158 mg/dL — ABNORMAL HIGH (ref 65–99)

## 2017-02-13 LAB — BASIC METABOLIC PANEL
ANION GAP: 7 (ref 5–15)
BUN: 46 mg/dL — ABNORMAL HIGH (ref 6–20)
CALCIUM: 8.5 mg/dL — AB (ref 8.9–10.3)
CO2: 26 mmol/L (ref 22–32)
CREATININE: 1.53 mg/dL — AB (ref 0.61–1.24)
Chloride: 101 mmol/L (ref 101–111)
GFR, EST AFRICAN AMERICAN: 52 mL/min — AB (ref >60)
GFR, EST NON AFRICAN AMERICAN: 45 mL/min — AB (ref >60)
Glucose, Bld: 42 mg/dL — CL (ref 65–99)
Potassium: 4.3 mmol/L (ref 3.5–5.1)
SODIUM: 134 mmol/L — AB (ref 135–145)

## 2017-02-13 MED ORDER — DEXTROSE 50 % IV SOLN
INTRAVENOUS | Status: AC
  Administered 2017-02-13: 06:00:00 12.5 g via INTRAVENOUS
  Filled 2017-02-13: qty 50

## 2017-02-13 MED ORDER — DEXTROSE 50 % IV SOLN
INTRAVENOUS | Status: AC
  Administered 2017-02-13: 09:00:00 50 mL
  Filled 2017-02-13: qty 50

## 2017-02-13 MED ORDER — DEXTROSE-NACL 5-0.9 % IV SOLN
INTRAVENOUS | Status: DC
  Administered 2017-02-13 – 2017-02-15 (×3): via INTRAVENOUS

## 2017-02-13 MED ORDER — INSULIN ASPART PROT & ASPART (70-30 MIX) 100 UNIT/ML ~~LOC~~ SUSP
12.0000 [IU] | Freq: Two times a day (BID) | SUBCUTANEOUS | Status: DC
  Administered 2017-02-13: 12 [IU] via SUBCUTANEOUS
  Filled 2017-02-13: qty 1

## 2017-02-13 MED ORDER — DEXTROSE 50 % IV SOLN
12.5000 g | Freq: Once | INTRAVENOUS | Status: AC
  Administered 2017-02-13: 12.5 g via INTRAVENOUS

## 2017-02-13 NOTE — Progress Notes (Signed)
[facility] - [place] at [facility]   PATIENT NAME: Jeremy LofflerBarry Gutierrez    MR#:  [id number]  DATE OF BIRTH:  12/04/1950  SUBJECTIVE:  CHIEF COMPLAINT:   Chief Complaint  Patient presents with  . Medical Clearance  . Failure To Thrive    IVC in place Sitter at bedside No pain/SOB  Hypoglycemia <10  REVIEW OF SYSTEMS:    Review of Systems  Constitutional: Negative for chills, fever and malaise/fatigue.  HENT: Negative for sore throat.   Eyes: Negative for blurred vision, double vision and pain.  Respiratory: Negative for cough, hemoptysis, shortness of breath and wheezing.   Cardiovascular: Negative for chest pain, palpitations, orthopnea and leg swelling.  Gastrointestinal: Negative for abdominal pain, constipation, diarrhea, heartburn, nausea and vomiting.  Genitourinary: Negative for dysuria and hematuria.  Musculoskeletal: Negative for back pain and joint pain.  Skin: Negative for rash.  Neurological: Negative for sensory change, speech change, focal weakness, weakness and headaches.  Endo/Heme/Allergies: Does not bruise/bleed easily.  Psychiatric/Behavioral: Negative for depression. The patient is not nervous/anxious.     DRUG ALLERGIES:   Allergies  Allergen Reactions  . Sulfa Antibiotics Hives    Patient states he is not allergic to this medication    VITALS:  Blood pressure (!) 149/61, pulse (!) 104, temperature 99.1 F (37.3 C), temperature source Oral, resp. rate 20, height 5\' 7"  (1.702 m), weight 103 kg (227 lb 1.6 oz), SpO2 100 %.  PHYSICAL EXAMINATION:   Physical Exam  GENERAL:  66 y.o.-year-old patient lying in the bed with no acute distress.  EYES: Pupils equal, round, reactive to light and accommodation. No scleral icterus. Extraocular muscles intact.  HEENT: Head atraumatic, normocephalic. Oropharynx and nasopharynx clear.  NECK:  Supple, no jugular venous distention. No thyroid enlargement, no tenderness.  LUNGS: Normal breath sounds  bilaterally, no wheezing, rales, rhonchi. No use of accessory muscles of respiration.  CARDIOVASCULAR: S1, S2 normal. No murmurs, rubs, or gallops.  ABDOMEN: Soft, nontender, nondistended. Bowel sounds present. No organomegaly or mass.  EXTREMITIES: B/L LE unna boots NEUROLOGIC: Cranial nerves II through XII are intact. No focal Motor or sensory deficits b/l.   PSYCHIATRIC: The patient is alert and awake SKIN: No obvious rash, lesion, or ulcer.   LABORATORY PANEL:   CBC Recent Labs  Lab 02/12/17 0415  WBC 11.6*  HGB 16.0  HCT 49.0  PLT 116*   ------------------------------------------------------------------------------------------------------------------ Chemistries  Recent Labs  Lab 02/09/17 1207  02/13/17 0424  NA 135   < > 134*  K 4.7   < > 4.3  CL 100*   < > 101  CO2 25   < > 26  GLUCOSE 240*   < > 42*  BUN 35*   < > 46*  CREATININE 1.33*   < > 1.53*  CALCIUM 8.9   < > 8.5*  AST 42*  --   --   ALT 27  --   --   ALKPHOS 93  --   --   BILITOT 3.0*  --   --    < > = values in this interval not displayed.   ------------------------------------------------------------------------------------------------------------------  Cardiac Enzymes No results for input(s): TROPONINI in the last 168 hours. ------------------------------------------------------------------------------------------------------------------  RADIOLOGY:  No results found.   ASSESSMENT AND PLAN:   * Uncontrolled DM with hypoglycemia BS < 10.  OJ, food and snacks tried after D50 amp. BS continued to low. 1 more amp of D50 given and started on D5-NS at 50 ml/hr. Will  reduce Insulin 70/30 to 10 units BID  * MRSA bacteremia due to b/l LE cellulitis and ulcers On IV vancomycin Repeat bcx NGTD.  Echo with possible mitral valve vegetation. Will need TEE. Discussed with Dr. Lady GaryFath. TEE tomorrow  * Acute metabolic encephalopathy due to above  * Hypertension Hold medications  * Chronic  diastolic CHF LV EF: 60% - 65%. On torsemide  * CKD stage III.  Stable.  * Thrombocytopenia, mild Due to bacteremia likely No bleeding  IVC in place. Will need psych consult prior to discharge regarding IVC. He has been declared incompetent make medical decisions by tele psych in ED.  All the records are reviewed and case discussed with Care Management/Social Worker Management plans discussed with the patient, family and they are in agreement.  CODE STATUS: FULL CODE  DVT Prophylaxis: SCDs  TOTAL CC TIME TAKING CARE OF THIS PATIENT: 35 minutes.   POSSIBLE D/C IN 3-4 DAYS, DEPENDING ON CLINICAL CONDITION.  Molinda BailiffSrikar R Ram Haugan M.D on 02/13/2017 at 12:17 PM  Between 7am to 6pm - Pager - [phone]  After 6pm go to [email or web address] - password [email or web address] [facility]  [facility] [place] [facility]  Office  [phone]  CC: Primary care physician; Derwood KaplanEason, Ernest B, MD  Note: This dictation was prepared with Dragon dictation along with smaller phrase technology. Any transcriptional errors that result from this process are unintentional.

## 2017-02-13 NOTE — Plan of Care (Signed)
Pt's blood sugar was 46. 2 containers of OJ given.  On recheck it was 43.  Gave pt 4 oz of shasta.  On recheck it was 56.  Will recheck again.

## 2017-02-13 NOTE — Plan of Care (Signed)
Despite intervention couldn't get blood sugar to stay up.  Got patient his breakfast tray early.  Notified Dr. Elpidio AnisSudini - 1 am of D50 given.  Also started D5 NS at 50 ml/hr. Blood sugar now 154.

## 2017-02-13 NOTE — Progress Notes (Signed)
Patient ID: Jeremy AbbeyBarry L Gutierrez, male   DOB: 02/16/1951, 66 y.o.   MRN: [id number]          [facility]    Date of Admission:  02/11/2017   Day 5 vancomycin         [name] has MRSA bacteremia due to lower extremity cellulitis and ulcerations.  Repeat blood cultures are negative at 48 hours.  TTE shows a possible small mitral valve vegetation.  I recommend a TEE to help determine optimal duration of therapy.  I would continue vancomycin.  I would recommend holding off on PICC placement until we know repeat blood cultures remain negative for at least 3 days.  This is a remote consultation.  I will follow up on 02/14/2017.         Cliffton AstersJohn Jeena Arnett, MD [facility] [phone] pager   [phone] cell 02/13/2017, 11:01 AM

## 2017-02-13 NOTE — Progress Notes (Addendum)
CRITICAL VALUE STICKER  CRITICAL VALUE: Glucose 42  RECEIVER (on-site recipient of call): Jeremy CombsSarah Teryl Gutierrez   DATE & TIME NOTIFIED: 02/13/17  0532  MESSENGER (representative from lab):  Pt given 4 oz of orange juice per protocol.  Pt asymptomatic. NT to take cbg at 0545. Jeremy CombsSarah Santrice Muzio RN  [phone] CBG read "low".  Gave 1/2 amp D50 IVP per protocol.   [id number] CBG elevated to 72.  Pt given graham crackers and peanut butter.  Jeremy CombsSarah Jonell Brumbaugh RN

## 2017-02-14 ENCOUNTER — Encounter: Payer: Self-pay | Admitting: Emergency Medicine

## 2017-02-14 ENCOUNTER — Inpatient Hospital Stay
Admit: 2017-02-14 | Discharge: 2017-02-14 | Disposition: A | Discharge status: Home / Self Care | Payer: Medicare Other | Attending: Cardiology | Admitting: Cardiology

## 2017-02-14 ENCOUNTER — Encounter
Admission: EM | Disposition: A | Discharge status: Skilled Nursing Facility | Payer: Self-pay | Source: Home / Self Care | Attending: Internal Medicine

## 2017-02-14 HISTORY — PX: TEE WITHOUT CARDIOVERSION: SHX5443

## 2017-02-14 LAB — CULTURE, BLOOD (ROUTINE X 2): CULTURE: NO GROWTH

## 2017-02-14 LAB — CREATININE, SERUM
Creatinine, Ser: 1.52 mg/dL — ABNORMAL HIGH (ref 0.61–1.24)
GFR calc Af Amer: 53 mL/min — ABNORMAL LOW (ref >60)
GFR, EST NON AFRICAN AMERICAN: 45 mL/min — AB (ref >60)

## 2017-02-14 LAB — GLUCOSE, CAPILLARY
GLUCOSE-CAPILLARY: 68 mg/dL (ref 65–99)
GLUCOSE-CAPILLARY: 74 mg/dL (ref 65–99)
GLUCOSE-CAPILLARY: 127 mg/dL — AB (ref 65–99)
Glucose-Capillary: 134 mg/dL — ABNORMAL HIGH (ref 65–99)
Glucose-Capillary: 212 mg/dL — ABNORMAL HIGH (ref 65–99)
Glucose-Capillary: 331 mg/dL — ABNORMAL HIGH (ref 65–99)

## 2017-02-14 LAB — VANCOMYCIN, TROUGH: Vancomycin Tr: 24 ug/mL (ref 15–20)

## 2017-02-14 SURGERY — ECHOCARDIOGRAM, TRANSESOPHAGEAL
Anesthesia: Moderate Sedation

## 2017-02-14 MED ORDER — SODIUM CHLORIDE 0.9 % IV SOLN: INTRAVENOUS | Status: DC

## 2017-02-14 MED ORDER — ENOXAPARIN SODIUM 40 MG/0.4ML ~~LOC~~ SOLN
40.0000 mg | SUBCUTANEOUS | Status: DC
  Administered 2017-02-14 – 2017-02-17 (×4): 40 mg via SUBCUTANEOUS
  Filled 2017-02-14 (×4): qty 0.4

## 2017-02-14 MED ORDER — SODIUM CHLORIDE FLUSH 0.9 % IV SOLN
INTRAVENOUS | Status: AC
  Administered 2017-02-14: 08:00:00
  Filled 2017-02-14: qty 10

## 2017-02-14 MED ORDER — DEXTROSE 50 % IV SOLN
INTRAVENOUS | Status: AC
Start: 2017-02-14 — End: 2017-02-14
  Administered 2017-02-14: 50 mL via INTRAVENOUS
  Filled 2017-02-14: qty 50

## 2017-02-14 MED ORDER — MIDAZOLAM HCL 2 MG/2ML IJ SOLN
INTRAMUSCULAR | Status: AC | PRN
  Administered 2017-02-14: 2 mg via INTRAVENOUS

## 2017-02-14 MED ORDER — VANCOMYCIN HCL IN DEXTROSE 750-5 MG/150ML-% IV SOLN
750.0000 mg | INTRAVENOUS | Status: DC
  Administered 2017-02-14 – 2017-02-15 (×2): 750 mg via INTRAVENOUS
  Filled 2017-02-14 (×2): qty 150

## 2017-02-14 MED ORDER — FENTANYL CITRATE (PF) 100 MCG/2ML IJ SOLN
INTRAMUSCULAR | Status: AC
  Administered 2017-02-14: 09:00:00
  Filled 2017-02-14: qty 2

## 2017-02-14 MED ORDER — MIDAZOLAM HCL 5 MG/5ML IJ SOLN
INTRAMUSCULAR | Status: AC
  Administered 2017-02-14: 08:00:00
  Filled 2017-02-14: qty 5

## 2017-02-14 MED ORDER — BUTAMBEN-TETRACAINE-BENZOCAINE 2-2-14 % EX AERO
INHALATION_SPRAY | CUTANEOUS | Status: AC
  Administered 2017-02-14: 09:00:00
  Filled 2017-02-14: qty 5

## 2017-02-14 MED ORDER — FENTANYL CITRATE (PF) 100 MCG/2ML IJ SOLN
INTRAMUSCULAR | Status: AC | PRN
  Administered 2017-02-14: 50 ug via INTRAVENOUS

## 2017-02-14 MED ORDER — DEXTROSE 50 % IV SOLN
1.0000 | Freq: Once | INTRAVENOUS | Status: AC
Start: 2017-02-14 — End: 2017-02-14
  Administered 2017-02-14: 50 mL via INTRAVENOUS

## 2017-02-14 MED ORDER — LIDOCAINE VISCOUS 2 % MT SOLN
OROMUCOSAL | Status: AC
  Filled 2017-02-14: qty 15

## 2017-02-14 NOTE — Progress Notes (Addendum)
Inpatient Diabetes Program Recommendations  AACE/ADA: New Consensus Statement on Inpatient Glycemic Control (2015)  Target Ranges:  Prepandial:   less than 140 mg/dL      Peak postprandial:   less than 180 mg/dL (1-2 hours)      Critically ill patients:  140 - 180 mg/dL   Lab Results  Component Value Date   GLUCAP 134 (H) 02/14/2017   HGBA1C 9.8 (H) 02/09/2017    Review of Glycemic Control  Diabetes history: DM Outpatient Diabetes medications: Novolog 75/25 mix 30 units ac breakfast + 25 units @ hs Current orders for Inpatient glycemic control: Novolog 75/30 mix 12 units bid  Inpatient Diabetes Program Recommendations:   Spoke with RN and patient had hypoglycemia last pm and during procedure this am.  -Change insulin to Lantus 10 units bid -Decrease Novolog correction to sensitive tid + hs 0-5 units -Novolog 3 units mealtime if eats 50%  Thank you, Jeremy HongJudy E. Alessia Gonsalez, RN, MSN, CDE  Diabetes Coordinator Inpatient Glycemic Control Team Team Pager [phone] (8am-5pm) 02/14/2017 10:38 AM

## 2017-02-14 NOTE — Evaluation (Signed)
Physical Therapy Evaluation Patient Details Name: Jeremy Gutierrez MRN: [id number] DOB: 1950-03-29 Today's Date: 02/14/2017   History of Present Illness  Pt is a 66 y/o M who recently left AMA after being treated for MRSA bacteremia secondary to bilateral lower extremity cellulitis/ulcerations, while at home patient was not able to take care of himself, patient was IVC by daughter as he could not take care of himself, telemetry psych ordered in the emergency room-patient declared incompetent, hospitalist asked to evaluate/admit for further care, patient evaluated in the emergency room, patient is confused/disoriented, patient is being readmitted for acute MRSA bacteremia secondary to bilateral lower extremity cellulitis/leg ulcerations.  Per chart review, plan is for PICC.  Pt's PMH includes aneurysm, CHF.    Clinical Impression  Pt admitted with above diagnosis. Pt currently with functional limitations due to the deficits listed below (see PT Problem List). Jeremy Gutierrez was agreeable to therapy although not very cooperative at times and becomes agitated when questions asked about PLOF and home layout, unsure of reliability of information.  Pt requires min assist for bed mobility, transfers, and to ambulate 10 ft with RW.  Pt with poor safety awareness and performs transfers unsafely even after instruction given on proper technique.  Given pt's current mobility status, recommending SNF at d/c.  Pt will benefit from skilled PT to increase their independence and safety with mobility to allow discharge to the venue listed below.      Follow Up Recommendations SNF    Equipment Recommendations  Other (comment)(TBD at next venue of care)    Recommendations for Other Services       Precautions / Restrictions Precautions Precautions: Fall;Other (comment) Precaution Comments: pt impulsive Restrictions Weight Bearing Restrictions: No      Mobility  Bed Mobility Overal bed mobility: Needs  Assistance Bed Mobility: Supine to Sit     Supine to sit: Min assist;HOB elevated     General bed mobility comments: Increased time and effort.  Pt relies heavily on bed rail.  Assist with bed pad to scoot to EOB.   Transfers Overall transfer level: Needs assistance Equipment used: Rolling walker (2 wheeled) Transfers: Sit to/from Stand Sit to Stand: Min assist         General transfer comment: Cues for proper hand placement and safe technique which pt does not follow consistently.  Pt stood from bed x1 and from chair x2.  Pt with very poorly controlled descent to sit despite cues to reach back and sit slowly.  When preparing to sit the pt attempts to sit prematurely and requires cues to sit safety.   Ambulation/Gait Ambulation/Gait assistance: Min assist Ambulation Distance (Feet): 13 Feet(3,10) Assistive device: Rolling walker (2 wheeled) Gait Pattern/deviations: Shuffle;Antalgic;Trunk flexed;Wide base of support Gait velocity: decreased Gait velocity interpretation: <1.8 ft/sec, indicative of risk for recurrent falls General Gait Details: Pt with flexed posture and shuffles his feet with heavily WBing through RW for support.  Pt stops ambulating every few feet for unknown reason that pt won't specify.  After ambulating 10 ft the pt simply stops and does not walk farther.   Stairs            Wheelchair Mobility    Modified Rankin (Stroke Patients Only)       Balance Overall balance assessment: Needs assistance Sitting-balance support: No upper extremity supported;Feet supported Sitting balance-Leahy Scale: Fair     Standing balance support: During functional activity;Bilateral upper extremity supported Standing balance-Leahy Scale: Poor Standing balance comment: Relies on BUE  support for static and dynamic activities                             Pertinent Vitals/Pain Pain Assessment: No/denies pain    Home Living Family/patient expects to be  discharged to:: Private residence Living Arrangements: Children(daughter) Available Help at Discharge: Family Type of Home: House Home Access: Stairs to enter Entrance Stairs-Rails: Right;Left;Can reach both Secretary/administratorntrance Stairs-Number of Steps: 4 Home Layout: One level Home Equipment: Walker - 2 wheels Additional Comments: Pt mumbles with unclear answers at times even when asked to repeat and pt easily agitated when asking him questions.  Unsure of reliability of information regarding PLOF or home layout given pt's uncooperativeness and confusion.     Prior Function Level of Independence: Independent         Comments: Pt reports he is independent with all ADLs/IADLs, mobility.  However, question the reliability of this information due to pt's confusion and uncooperativeness.  Daughter was likey assisting with some ADLs, IADLs, and mobility.      Hand Dominance        Extremity/Trunk Assessment   Upper Extremity Assessment Upper Extremity Assessment: (BUE strength grossly 4/5)    Lower Extremity Assessment Lower Extremity Assessment: Generalized weakness       Communication   Communication: Other (comment)(pt mumbling, difficult to understand)  Cognition Arousal/Alertness: Awake/alert Behavior During Therapy: Flat affect;Impulsive;Agitated Overall Cognitive Status: Difficult to assess                                        General Comments      Exercises     Assessment/Plan    PT Assessment Patient needs continued PT services  PT Problem List Decreased strength;Decreased activity tolerance;Decreased balance;Decreased mobility;Decreased cognition;Decreased knowledge of use of DME;Decreased safety awareness       PT Treatment Interventions DME instruction;Gait training;Stair training;Functional mobility training;Therapeutic activities;Therapeutic exercise;Balance training;Patient/family education;Wheelchair mobility training;Neuromuscular  re-education;Cognitive remediation    PT Goals (Current goals can be found in the Care Plan section)  Acute Rehab PT Goals Patient Stated Goal: none stated PT Goal Formulation: With patient Time For Goal Achievement: 02/28/17 Potential to Achieve Goals: Fair    Frequency Min 2X/week   Barriers to discharge Decreased caregiver support;Inaccessible home environment Unsure of amount of assist avaialble at home.  Pt has steps to enter home.     Co-evaluation               AM-PAC PT "6 Clicks" Daily Activity  Outcome Measure Difficulty turning over in bed (including adjusting bedclothes, sheets and blankets)?: Unable Difficulty moving from lying on back to sitting on the side of the bed? : Unable Difficulty sitting down on and standing up from a chair with arms (e.g., wheelchair, bedside commode, etc,.)?: Unable Help needed moving to and from a bed to chair (including a wheelchair)?: A Little Help needed walking in hospital room?: A Lot Help needed climbing 3-5 steps with a railing? : A Lot 6 Click Score: 10    End of Session Equipment Utilized During Treatment: Gait belt Activity Tolerance: Patient limited by fatigue;Treatment limited secondary to agitation Patient left: in chair;with call bell/phone within reach;with chair alarm set;with nursing/sitter in room Nurse Communication: Mobility status PT Visit Diagnosis: Unsteadiness on feet (R26.81);Other abnormalities of gait and mobility (R26.89);Muscle weakness (generalized) (M62.81);Difficulty in walking, not elsewhere classified (  R26.2)    Time: 1610-9604: 1211-1235 PT Time Calculation (min) (ACUTE ONLY): 24 min   Charges:   PT Evaluation $PT Eval Low Complexity: 1 Low PT Treatments $Gait Training: 8-22 mins   PT G Codes:   PT G-Codes **NOT FOR INPATIENT CLASS** Functional Assessment Tool Used: AM-PAC 6 Clicks Basic Mobility;Clinical judgement Functional Limitation: Mobility: Walking and moving around Mobility: Walking and  Moving Around Current Status ([id number](G8978): At least 60 percent but less than 80 percent impaired, limited or restricted Mobility: Walking and Moving Around Goal Status [phone](G8979): At least 20 percent but less than 40 percent impaired, limited or restricted    Encarnacion ChuAshley Lillybeth Tal PT, DPT 02/14/2017, 1:16 PM

## 2017-02-14 NOTE — Consult Note (Signed)
Psychiatry: Consult received.  Reviewed chart including multiple notes over the last few days.  Came to see the patient.  Currently construction work is being performed on this ward of the hospital and [facility] is so loud in the patient's room it was impossible to have a conversation.  I will return when there is a lull in the construction work.

## 2017-02-14 NOTE — Clinical Social Work Placement (Signed)
   CLINICAL SOCIAL WORK PLACEMENT  NOTE  Date:  02/14/2017  Patient Details  Name: Merideth AbbeyBarry L Travieso MRN: [id number] Date of Birth: 08/08/1950  Clinical Social Work is seeking post-discharge placement for this patient at the Skilled  Nursing Facility level of care (*CSW will initial, date and re-position this form in  chart as items are completed):  Yes   Patient/family provided with [place] Clinical Social Work Department's list of facilities offering this level of care within the geographic area requested by the patient (or if unable, by the patient's family).  Yes   Patient/family informed of their freedom to choose among providers that offer the needed level of care, that participate in Medicare, Medicaid or managed care program needed by the patient, have an available bed and are willing to accept the patient.  Yes   Patient/family informed of [facility]'s ownership interest in [facility] and [facility], as well as of the fact that they are under no obligation to receive care at these facilities.  PASRR submitted to EDS on 02/14/17     PASRR number received on 02/14/17     Existing PASRR number confirmed on       FL2 transmitted to all facilities in geographic area requested by pt/family on 02/14/17     FL2 transmitted to all facilities within larger geographic area on       Patient informed that his/her managed care company has contracts with or will negotiate with certain facilities, including the following:            Patient/family informed of bed offers received.  Patient chooses bed at       Physician recommends and patient chooses bed at      Patient to be transferred to   on  .  Patient to be transferred to facility by       Patient family notified on   of transfer.  Name of family member notified:        PHYSICIAN       Additional Comment:    _______________________________________________ Alajia Schmelzer, Darleen CrockerBailey M, LCSW 02/14/2017, 9:12 PM

## 2017-02-14 NOTE — Progress Notes (Signed)
Pharmacy Antibiotic Note  Jeremy Gutierrez is a 66 y.o. male re-admitted on 02/11/2017 with MRSA bacteremia.  Pharmacy has been consulted for vancomycin dosing.  Plan: Patient was previously here earlier in the day and was receiving vanc 1.25g IV q18h. Trough was drawn after dose was running and was 35 mcg/mL; however, vanc was discontinue because Scr was rising. Random level was drawn when patient readmitted: 12/23 @ 2041 VR 18 and patient was given vanc 1.25g IV x 1 in the ED  Ke 0.047 T1/2 14 ~ 12 hrs Vd 57L AjBW 81.4 kg CrCl 51.5 ml/min  In 12 hrs patient will be at approximately 20 mcg/mL -- Will start patient on vanc 750 mg IV q12h. Will check a vanc trough 12/26 @ 0000 prior to 4th dose. Css 18 mcg/mL Will draw vanc trough sooner if renal function declines further -- Scr 1.33 >> 1.40 >> 1.51 >> 1.53 >> 1.58  12/26 0130 vanc level 24. Changed to 750 mg q 18 hours. Level before 3rd new dose to ensure clearance.  Height: 5\' 7"  (170.2 cm) Weight: 227 lb 1.6 oz (103 kg) IBW/kg (Calculated) : 66.1  Temp (24hrs), Avg:98.9 F (37.2 C), Min:98.8 F (37.1 C), Max:98.9 F (37.2 C)  Recent Labs  Lab 02/09/17 1207 02/09/17 1515  02/10/17 0659 02/11/17 0601 02/11/17 2041 02/12/17 0415 02/13/17 0424 02/14/17 0125  WBC 12.9*  --   --  13.1* 11.7* 13.7* 11.6*  --   --   CREATININE 1.33*  --    < > 1.51* 1.53* 1.58* 1.39* 1.53* 1.52*  LATICACIDVEN 2.6* 2.1*  --   --   --   --   --   --   --   VANCOTROUGH  --   --   --   --  35*  --   --   --  24*  VANCORANDOM  --   --   --   --   --  18  --   --   --    < > = values in this interval not displayed.    Estimated Creatinine Clearance: 53.2 mL/min (A) (by C-G formula based on SCr of 1.52 mg/dL (H)).    Allergies  Allergen Reactions  . Sulfa Antibiotics Hives    Patient states he is not allergic to this medication    Thank you for allowing pharmacy to be a part of this patient's care.  Thomasene Rippleavid Besanti, PharmD, BCPS Clinical  Pharmacist 02/14/2017

## 2017-02-14 NOTE — Progress Notes (Signed)
Patient ID: Jeremy Gutierrez, male   DOB: March 15, 1950, 66 y.o.   MRN: [id number]           [facility]    Date of Admission:  02/11/2017   Day 6 vancomycin         Jeremy Gutierrez has MSSA and MR coagulase-negative staph bacteremia due to lower extremity cellulitis and ulcerations.  Repeat blood cultures are negative at 22 hours.  TEE revealed mitral valve vegetation with mitral stenosis and regurgitation.  I would continue vancomycin for 6 weeks of total therapy.  It is safe to go ahead and have a PICC placed this is a remote consultation.  I will sign off now.  Diagnosis: Mitral valve endocarditis  Culture Result: MSSA and MR-CNS  Allergies  Allergen Reactions  . Sulfa Antibiotics Hives    Patient states he is not allergic to this medication    OPAT Orders Discharge antibiotics: Per pharmacy protocol vancomycin Aim for Vancomycin trough 15-20 (unless otherwise indicated) Duration: 6 weeks End Date: 03/22/2017  [facility] Care Per Protocol:  Labs weekly while on IV antibiotics: _x_ CBC with differential _x_ BMP __ CMP __ CRP __ ESR _x_ Vancomycin trough  _x_ Please pull PIC at completion of IV antibiotics __ Please leave PIC in place until doctor has seen patient or been notified         Michel Bickers, [place] for [place] [phone] pager   [phone] cell 02/14/2017, 11:50 AM

## 2017-02-14 NOTE — Progress Notes (Signed)
*  PRELIMINARY RESULTS* Echocardiogram 2D Echocardiogram has been performed.  Cristela BlueHege, Duey Liller 02/14/2017, 9:01 AM

## 2017-02-14 NOTE — Clinical Social Work Note (Signed)
Clinical Social Work Assessment  Patient Details  Name: Jeremy Gutierrez MRN: [id number] Date of Birth: 09/12/1950  Date of referral:  02/14/17               Reason for consult:  Capacity, Facility Placement                Permission sought to share information with:  Oceanographeracility Contact Representative Permission granted to share information::  Yes, Verbal Permission Granted  Name::      Skilled Nursing Facility   Agency::   [place]   Relationship::     Contact Information:     Housing/Transportation Living arrangements for the past 2 months:  Single Family Home Source of Information:  Adult Children Patient Interpreter Needed:  None Criminal Activity/Legal Involvement Pertinent to Current Situation/Hospitalization:  No - Comment as needed Significant Relationships:  Adult Children Lives with:  Adult Children Do you feel safe going back to the place where you live?  Yes Need for family participation in patient care:  Yes (Comment)  Care giving concerns:  Patient lives in [place] with his daughter Jeremy Gutierrez and granddaughter.    Social Worker assessment / plan:  Visual merchandiserClinical Social Worker (CSW) reviewed chart and noted that PT is recommending SNF. Per chart patient is confused and a psych consult is pending to determine his capacity to make decisions. CSW contacted patient's daughter Jeremy Gutierrez. Per daughter patient lives with her in [place] and uses a lot of alcohol. Per daughter he has a hard time walking and taking care of himself. Per daughter she works during the day and patient is left alone. Daughter requested SNF placement. CSW made daughter aware that if patient has capacity then he can refuse placement. Daughter verbalized her understanding. CSW also explained that medicare requires a 3 night qualifying inpatient stay in a hospital in order to pay for SNF. Patient was admitted to inpatient on 02/11/17. Daughter verbalized her understanding and is agreeable to SNF search in [place]. FL2 complete and faxed out. CSW will continue to follow and assist as needed.   Employment status:  Disabled (Comment on whether or not currently receiving Disability) Insurance information:  Medicare PT Recommendations:  Skilled Nursing Facility Information / Referral to community resources:  Skilled Nursing Facility  Patient/Family's Response to care:  Patient's daughter requested SNF placement.   Patient/Family's Understanding of and Emotional Response to Diagnosis, Current Treatment, and Prognosis:  Patient's daughter was very pleasant and thanked CSW for assistance.   Emotional Assessment Appearance:    Attitude/Demeanor/Rapport:  Unable to Assess Affect (typically observed):  Unable to Assess Orientation:  Oriented to Self, Fluctuating Orientation (Suspected and/or reported Sundowners) Alcohol / Substance use:  Alcohol Use Psych involvement (Current and /or in the community):  Yes (Comment)(Psych consult ordered to determine capacity. )  Discharge Needs  Concerns to be addressed:  Discharge Planning Concerns Readmission within the last 30 days:  No Current discharge risk:  Dependent with Mobility, Substance Abuse Barriers to Discharge:  Continued Medical Work up   [employer], Jeremy CrockerBailey M, LCSW 02/14/2017, 9:15 PM

## 2017-02-14 NOTE — Progress Notes (Signed)
PT Cancellation Note  Patient Details Name: Merideth AbbeyBarry L Creed MRN: [id number] DOB: 06/13/1950   Cancelled Treatment:    Reason Eval/Treat Not Completed: Patient at procedure or test/unavailable(pt currently off floor for TEE).  Will continue to follow acutely and will attempt to see pt again later today, schedule permitting.   Encarnacion ChuAshley Abashian PT, DPT 02/14/2017, 9:30 AM

## 2017-02-14 NOTE — Progress Notes (Signed)
[facility] - [place] at [facility]   PATIENT NAME: Jeremy Gutierrez    MR#:  [id number]  DATE OF BIRTH:  08/04/1950  SUBJECTIVE:  CHIEF COMPLAINT:   Chief Complaint  Patient presents with  . Medical Clearance  . Failure To Thrive    Patient seen earlier today.  Flat affect. No pain. Wants to go home.  IVC in place.  He was deemed incompetent to make healthcare decisions in the emergency room by tele psych. His confusion is improved.  REVIEW OF SYSTEMS:    Review of Systems  Constitutional: Negative for chills, fever and malaise/fatigue.  HENT: Negative for sore throat.   Eyes: Negative for blurred vision, double vision and pain.  Respiratory: Negative for cough, hemoptysis, shortness of breath and wheezing.   Cardiovascular: Negative for chest pain, palpitations, orthopnea and leg swelling.  Gastrointestinal: Negative for abdominal pain, constipation, diarrhea, heartburn, nausea and vomiting.  Genitourinary: Negative for dysuria and hematuria.  Musculoskeletal: Negative for back pain and joint pain.  Skin: Negative for rash.  Neurological: Negative for sensory change, speech change, focal weakness, weakness and headaches.  Endo/Heme/Allergies: Does not bruise/bleed easily.  Psychiatric/Behavioral: Negative for depression. The patient is not nervous/anxious.     DRUG ALLERGIES:   Allergies  Allergen Reactions  . Sulfa Antibiotics Hives    Patient states he is not allergic to this medication    VITALS:  Blood pressure 121/70, pulse 100, temperature 98.3 F (36.8 C), temperature source Oral, resp. rate 18, height 5\' 7"  (1.702 m), weight 101.6 kg (224 lb), SpO2 98 %.  PHYSICAL EXAMINATION:   Physical Exam  GENERAL:  66 y.o.-year-old patient lying in the bed with no acute distress.  EYES: Pupils equal, round, reactive to light and accommodation. No scleral icterus. Extraocular muscles intact.  HEENT: Head atraumatic, normocephalic. Oropharynx and  nasopharynx clear.  NECK:  Supple, no jugular venous distention. No thyroid enlargement, no tenderness.  LUNGS: Normal breath sounds bilaterally, no wheezing, rales, rhonchi. No use of accessory muscles of respiration.  CARDIOVASCULAR: S1, S2 normal. No murmurs, rubs, or gallops.  ABDOMEN: Soft, nontender, nondistended. Bowel sounds present. No organomegaly or mass.  EXTREMITIES: B/L LE unna boots NEUROLOGIC: Cranial nerves II through XII are intact. No focal Motor or sensory deficits b/l.   PSYCHIATRIC: The patient is alert and awake SKIN: No obvious rash, lesion, or ulcer.   LABORATORY PANEL:   CBC Recent Labs  Lab 02/12/17 0415  WBC 11.6*  HGB 16.0  HCT 49.0  PLT 116*   ------------------------------------------------------------------------------------------------------------------ Chemistries  Recent Labs  Lab 02/09/17 1207  02/13/17 0424 02/14/17 0125  NA 135   < > 134*  --   K 4.7   < > 4.3  --   CL 100*   < > 101  --   CO2 25   < > 26  --   GLUCOSE 240*   < > 42*  --   BUN 35*   < > 46*  --   CREATININE 1.33*   < > 1.53* 1.52*  CALCIUM 8.9   < > 8.5*  --   AST 42*  --   --   --   ALT 27  --   --   --   ALKPHOS 93  --   --   --   BILITOT 3.0*  --   --   --    < > = values in this interval not displayed.   ------------------------------------------------------------------------------------------------------------------  Cardiac Enzymes No  results for input(s): TROPONINI in the last 168 hours. ------------------------------------------------------------------------------------------------------------------  RADIOLOGY:  No results found.   ASSESSMENT AND PLAN:   * Uncontrolled DM with hypoglycemia Continue D5 normal saline.  Discontinue insulin 70/30. Stop D5 once blood sugars consistently greater than 150. Will need meal coverage once sugars improved.  *MSSA and Staphylococcus hemolyticus bacteremia TEE shows mitral valve vegetation.  Patient will  likely need mitral valve replacement as per Dr. Lady GaryFath of cardiology. Discussed with Dr. Orvan Falconerampbell of infectious disease at [facility].  Advised PICC line and vancomycin through IV for 6 weeks. Patient will need cardiothoracic evaluation once infection clears.  * Acute metabolic encephalopathy due to above Improved  * Hypertension Hold medications  * Chronic diastolic CHF LV EF: 60% - 65%. On torsemide  * CKD stage III.  Stable.  * Thrombocytopenia, mild Due to bacteremia likely No bleeding  IVC in place.  We will consult psychiatry for competency and to discontinue IVC if possible.  Discussed with daughter.  Concerned that patient would not be able to care for himself at home.  Consult physical therapy.  Will have to consider skilled nursing facility.  All the records are reviewed and case discussed with Care Management/Social Worker Management plans discussed with the patient, family and they are in agreement.  CODE STATUS: FULL CODE  DVT Prophylaxis: SCDs  TOTAL CC TIME TAKING CARE OF THIS PATIENT: 35 minutes.   POSSIBLE D/C IN 1-2 DAYS, DEPENDING ON CLINICAL CONDITION.  Molinda BailiffSrikar R Roanne Haye M.D on 02/14/2017 at 1:05 PM  Between 7am to 6pm - Pager - [phone]  After 6pm go to [email or web address] - password [email or web address] [email or web address]  [facility]  Office  [phone]  CC: Primary care physician; Derwood KaplanEason, Ernest B, MD  Note: This dictation was prepared with Dragon dictation along with smaller phrase technology. Any transcriptional errors that result from this process are unintentional.

## 2017-02-14 NOTE — Progress Notes (Signed)
Pharmacy Antibiotic Note  Jeremy Gutierrez is a 66 y.o. male admitted on 02/11/2017 with MRSA bacteremia.  Pharmacy has been consulted for vancomycin dosing. TEE suggestive of vegetation. Patient is also being treated for cellulitis.   Plan: Continue vancomycin 750mg  IV Q18hr for goal trough of 15-20. Follow up trough is scheduled for 12/27 at 0130 to confirm clearance. Will continue to monitor serum creatinine every 48-72hr.   Height: 5\' 7"  (170.2 cm) Weight: 224 lb (101.6 kg) IBW/kg (Calculated) : 66.1  Temp (24hrs), Avg:98.7 F (37.1 C), Min:98.3 F (36.8 C), Max:98.9 F (37.2 C)  Recent Labs  Lab 02/09/17 1207 02/09/17 1515  02/10/17 0659 02/11/17 0601 02/11/17 2041 02/12/17 0415 02/13/17 0424 02/14/17 0125  WBC 12.9*  --   --  13.1* 11.7* 13.7* 11.6*  --   --   CREATININE 1.33*  --    < > 1.51* 1.53* 1.58* 1.39* 1.53* 1.52*  LATICACIDVEN 2.6* 2.1*  --   --   --   --   --   --   --   VANCOTROUGH  --   --   --   --  35*  --   --   --  24*  VANCORANDOM  --   --   --   --   --  18  --   --   --    < > = values in this interval not displayed.    Estimated Creatinine Clearance: 52.8 mL/min (A) (by C-G formula based on SCr of 1.52 mg/dL (H)).    Allergies  Allergen Reactions  . Sulfa Antibiotics Hives    Patient states he is not allergic to this medication    Antimicrobials this admission: Zosyn 12/21 >> 12/23  Vancomycin 12/21 >>   Dose adjustments this admission: 12/25 Vancomycin adjusted to 750mg  IV Q18hr.   Microbiology results: 12/21 BCx: MRSA 12/23 BCx: No growth x 3 days   Thank you for allowing pharmacy to be a part of this patient's care.  Simpson,Michael L 02/14/2017 10:10 AM

## 2017-02-14 NOTE — Care Management Important Message (Signed)
Important Message  Patient Details  Name: Jeremy Gutierrez MRN: [id number] Date of Birth: 09/04/1950   Medicare Important Message Given:  Yes    Gwenette GreetBrenda S Keiandra Sullenger, RN 02/14/2017, 7:36 AM

## 2017-02-14 NOTE — Plan of Care (Signed)
Called diabetes coordinator b/c special procedures had to give amp of D50 when pt was having his TEE.  Was questioning whether to give 12 u 70/30.  She suggested I call the Dr.  Dr. Elpidio AnisSudini d/ced 70/30.  DC called back and sd she was putting in recommendations for Lantus and mealtime coverage if he eats > 50 and if BS > 80.

## 2017-02-14 NOTE — Progress Notes (Signed)
Unna boots changed.  Pt with less edema to bilateral legs.  Right leg anterior wound has improved to 20% slough and 80 % new granulation tissue.  Both legs with large amounts of purulent, green, foul smelling drainage. Left great toe and right second toe have wounds.  Bilateral toes are ruddy, less than 3 second cap refill and warm.  However the right second toe is dark mid toe with red tip.  Pt reports no pain to toes.  Left great toe scab has softened and there is some slough and bleeding from the wound. Pt NPO for TEE today.   Henriette CombsSarah Bibi Economos RN

## 2017-02-14 NOTE — Consult Note (Signed)
[facility] CLINIC CARDIOLOGY A [facility] PRACTICE  CARDIOLOGY CONSULT NOTE  Patient ID: Jeremy Gutierrez MRN: [id number] DOB/AGE: [birth year] 66 y.o.  Admit date: 02/11/2017 Referring Physician Dr. Elpidio Anis Primary Physician Dr. Maryellen Pile Primary Cardiologist Dr. Juliann Pares Reason for Consultation bacteremia  HPI: Patient is a 66 year old male with a history of combined systolic and diastolic heart failure with echocardiogram done in the office in October of this year revealing reserved LV function with left atrial enlargement and severe mitral regurgitation.  Patient has a history of functional study done in October of this year revealing an ejection fraction of 52% with no reversible ischemia.  Patient had a cardiac catheterization 7 years ago showing multivessel coronary disease which was treated medically based on the results of the functional study.  He was treated with carvedilol and Entresto as an outpatient.Marland Kitchen  He is also on simvastatin and furosemide.  He has a history of diabetes treated with insulin.  He was recently admitted with cellulitis on his lower extremities with MRSA bacteremia.   He underwent an echo which revealed ef of 50-55% with moderate mr and no obvious vegetations. He left AMA  And returned to the er due to inability to care for himself. He was felt to be incompetent in the er and was referred to psychiatry and for tee to better evaluate the mitral valve. Pt is a very difficult historian. He has acute on chronic renal insuffiency with creatinine of 1.53, cbc is 11.6. CXR shows no acute cardiopulmonary disease. EKG shows nsr with  Lpfb. He has bilateral lower extremety edema with bilateral  Green purulent drainage. TEE done this am shows probable vegetation on posterior mitral valve leaflet. Aortic and tricuspid valves appears without vegetation.   Review of Systems  HENT: Negative.   Eyes: Negative.   Respiratory: Positive for shortness of breath.   Cardiovascular:  Positive for leg swelling.  Gastrointestinal: Negative.   Genitourinary: Negative.   Musculoskeletal: Negative.   Skin:       Bilateral lower extremity cellulitis with purulent greenish drainage.   Neurological: Positive for weakness.  Endo/Heme/Allergies: Negative.   Psychiatric/Behavioral: Positive for memory loss.    Past Medical History:  Diagnosis Date  . Aneurysm (HCC)   . Arthritis   . CHF (congestive heart failure) (HCC)   . Diabetes mellitus without complication (HCC)   . Heart murmur    as a child- 'nothing to worry about'  . Sleep apnea     Family History  Problem Relation Age of Onset  . CAD Mother   . Cancer Sister     Social History   Socioeconomic History  . Marital status: Divorced    Spouse name: Not on file  . Number of children: Not on file  . Years of education: Not on file  . Highest education level: Not on file  Social Needs  . Financial resource strain: Not on file  . Food insecurity - worry: Not on file  . Food insecurity - inability: Not on file  . Transportation needs - medical: Not on file  . Transportation needs - non-medical: Not on file  Occupational History  . Not on file  Tobacco Use  . Smoking status: Current Every Day Smoker    Packs/day: 0.50    Years: 40.00    Pack years: 20.00    Types: Cigarettes    Last attempt to quit: 06/02/2012    Years since quitting: 4.7  . Smokeless tobacco: Never Used  .  Tobacco comment: smoked for many years.  Substance and Sexual Activity  . Alcohol use: Yes    Comment: social  . Drug use: No  . Sexual activity: Not on file  Other Topics Concern  . Not on file  Social History Narrative  . Not on file    Past Surgical History:  Procedure Laterality Date  . BUNIONECTOMY Right   . CARDIAC CATHETERIZATION  2010  . CATARACT EXTRACTION W/PHACO Right 06/05/2012   Procedure: CATARACT EXTRACTION PHACO AND INTRAOCULAR LENS PLACEMENT (IOC);  Surgeon: Shade FloodGreer Geiger, MD;  [facility];  Service:  Ophthalmology;  Laterality: Right;  . Colonscopy  2012     Medications Prior to Admission  Medication Sig Dispense Refill Last Dose  . aspirin 81 MG tablet Take 81 mg by mouth daily.   02/11/2017 at Unknown time  . carvedilol (COREG) 3.125 MG tablet Take 3.125 mg by mouth 2 (two) times daily with a meal.   02/11/2017 at Unknown time  . hydrOXYzine (ATARAX/VISTARIL) 25 MG tablet TAKE 1 TO 2 TABLETS BY MOUTH AT BEDTIME IF NEEDED FOR ITCHING  0 02/11/2017 at Unknown time  . insulin lispro protamine-lispro (HUMALOG 75/25 MIX) (75-25) 100 UNIT/ML SUSP injection Inject 32 Units into the skin 2 (two) times daily with a meal. 30 morning and 25 at bedtime 10 mL 2 02/11/2017 at Unknown time  . montelukast (SINGULAIR) 10 MG tablet TK 1 T PO  D  0 02/10/2017 at Unknown time  . sacubitril-valsartan (ENTRESTO) 24-26 MG Take 1 tablet by mouth 2 (two) times daily.    02/08/2017 at Unknown time  . simvastatin (ZOCOR) 20 MG tablet Take 20 mg by mouth every evening.   02/10/2017 at Unknown time  . tamsulosin (FLOMAX) 0.4 MG CAPS capsule Take 0.4 mg by mouth daily.  0 02/08/2017 at Unknown time  . torsemide (DEMADEX) 100 MG tablet Take 100 mg by mouth daily.    02/08/2017 at Unknown time  . Vitamin D, Ergocalciferol, (DRISDOL) 50000 units CAPS capsule Take 1 capsule by mouth every 30 (thirty) days.  0 02/10/2017 at Unknown time  . ONE TOUCH ULTRA TEST test strip    UTD at UTD    Physical Exam: Blood pressure 128/66, pulse (!) 104, temperature 98.3 F (36.8 C), temperature source Oral, resp. rate 17, height 5\' 7"  (1.702 m), weight 101.6 kg (224 lb), SpO2 100 %.   Wt Readings from Last 1 Encounters:  02/14/17 101.6 kg (224 lb)     General appearance: cooperative and slowed mentation Resp: clear to auscultation bilaterally Cardio: regular rate and rhythm and systolic murmur: early systolic 2/6, musical at lower left sternal border GI: soft, non-tender; bowel sounds normal; no masses,  no  organomegaly Extremities: bilateral lower extremity drainage and cellulitis with edema Neurologic: Grossly normal  Labs:   Lab Results  Component Value Date   WBC 11.6 (H) 02/12/2017   HGB 16.0 02/12/2017   HCT 49.0 02/12/2017   MCV 99.4 02/12/2017   PLT 116 (L) 02/12/2017    Recent Labs  Lab 02/09/17 1207  02/13/17 0424 02/14/17 0125  NA 135   < > 134*  --   K 4.7   < > 4.3  --   CL 100*   < > 101  --   CO2 25   < > 26  --   BUN 35*   < > 46*  --   CREATININE 1.33*   < > 1.53* 1.52*  CALCIUM 8.9   < >  8.5*  --   PROT 8.0  --   --   --   BILITOT 3.0*  --   --   --   ALKPHOS 93  --   --   --   ALT 27  --   --   --   AST 42*  --   --   --   GLUCOSE 240*   < > 42*  --    < > = values in this interval not displayed.   No results found for: CKTOTAL, CKMB, CKMBINDEX, TROPONINI    Radiology: no acute cardiopulmonary disease EKG: nsr with lpfb and ibbb   ASSESSMENT AND PLAN:  Pt with history of mitral regurgitation and cad treated medically who is admitted with MRSA bacteremia and sevwere lower extremity cellulitis. TEE suggests probable vegetation on the posterior leaflet of the mitral valve with at least moderate to severe mr and partially flail posterior mitral valve leaflet.  Will need aggressive abx therapy for sbe and cellulitis. Consideration for valve repalcement will need to be discussed when infection is less acute. After load reduction will need to be continued with entresto. Continue with carvedilol. Will follow with you.  Signed: Dalia HeadingKenneth A Aniyiah Zell MD, [facility] 02/14/2017, 9:39 AM

## 2017-02-14 NOTE — NC FL2 (Signed)
[place] MEDICAID FL2 LEVEL OF CARE SCREENING TOOL     IDENTIFICATION  Patient Name: Jeremy Gutierrez Birthdate: 05/15/1950 Sex: male Admission Date (Current Location): 02/11/2017  [place]   Facility and Address:  [facility], [address], [place], [place] [address]      Provider Number: [id number]  Attending Physician Name and Address:  Milagros LollSudini, Srikar, MD  Relative Name and Phone Number:       Current Level of Care: Hospital Recommended Level of Care: Skilled Nursing Facility Prior Approval Number:    Date Approved/Denied:   PASRR Number: ([id number] [id number])  Discharge Plan: SNF    Current Diagnoses: Patient Active Problem List   Diagnosis Date Noted  . Pressure injury of skin 02/13/2017  . MRSA bacteremia 02/11/2017  . Sepsis (HCC) 02/09/2017  . Tobacco use disorder 01/23/2017  . Hyperglycemia 04/02/2016  . Essential hypertension, benign 02/29/2016  . AAA (abdominal aortic aneurysm) without rupture (HCC) 02/29/2016  . Diabetes (HCC) 02/29/2016  . Abscess of right thigh 10/29/2012    Orientation RESPIRATION BLADDER Height & Weight     Self, Time, Place  Normal Continent Weight: 224 lb (101.6 kg) Height:  5\' 7"  (170.2 cm)  BEHAVIORAL SYMPTOMS/MOOD NEUROLOGICAL BOWEL NUTRITION STATUS      Continent Diet(Diet: Heart Healthy/ Carb Modified. )  AMBULATORY STATUS COMMUNICATION OF NEEDS Skin   Extensive Assist Verbally PU Stage and Appropriate Care(pressure ulcer on legs and buttocks)                       Personal Care Assistance Level of Assistance  Bathing, Feeding, Dressing Bathing Assistance: Limited assistance Feeding assistance: Independent Dressing Assistance: Limited assistance     Functional Limitations Info  Sight, Hearing, Speech Sight Info: Adequate Hearing Info: Impaired Speech Info: Adequate    SPECIAL CARE FACTORS FREQUENCY  PT (By licensed PT), OT (By licensed OT)(PICC line and IV  ABX vancomycin for 6 weeks )     PT Frequency: (5) OT Frequency: (5)            Contractures      Additional Factors Info  Code Status, Allergies, Isolation Precautions Code Status Info: (Full Code. ) Allergies Info: (Sulfa Antibiotics)     Isolation Precautions Info: (MSSA and MR coagulase-negative staph bacteremia due to lower extremity cellulitis and ulcerations)     Current Medications (02/14/2017):  This is the current hospital active medication list Current Facility-Administered Medications  Medication Dose Route Frequency Provider Last Rate Last Dose  . acetaminophen (TYLENOL) tablet 650 mg  650 mg Oral Q6H PRN Salary, Jetty DuhamelMontell D, MD   650 mg at 02/13/17 2126   Or  . acetaminophen (TYLENOL) suppository 650 mg  650 mg Rectal Q6H PRN Salary, Montell D, MD      . aspirin EC tablet 81 mg  81 mg Oral Daily Salary, Montell D, MD   81 mg at 02/13/17 1049  . carvedilol (COREG) tablet 3.125 mg  3.125 mg Oral BID WC Salary, Montell D, MD   3.125 mg at 02/14/17 1102  . dextrose 5 %-0.9 % sodium chloride infusion   Intravenous Continuous Milagros LollSudini, Srikar, MD 20 mL/hr at 02/14/17 1400    . enoxaparin (LOVENOX) injection 40 mg  40 mg Subcutaneous Q24H Milagros LollSudini, Srikar, MD   40 mg at 02/14/17 1359  . HYDROcodone-acetaminophen (NORCO/VICODIN) 5-325 MG per tablet 1-2 tablet  1-2 tablet Oral Q4H PRN Salary, Evelena AsaMontell D, MD   2 tablet  at 02/12/17 2143  . hydrOXYzine (ATARAX/VISTARIL) tablet 25 mg  25 mg Oral TID PRN Salary, Montell D, MD      . insulin aspart (novoLOG) injection 0-15 Units  0-15 Units Subcutaneous TID WC Salary, Evelena AsaMontell D, MD   2 Units at 02/14/17 1241  . insulin aspart (novoLOG) injection 0-5 Units  0-5 Units Subcutaneous QHS Salary, Montell D, MD      . montelukast (SINGULAIR) tablet 10 mg  10 mg Oral QHS Salary, Jetty DuhamelMontell D, MD   10 mg at 02/13/17 2126  . multivitamin with minerals tablet 1 tablet  1 tablet Oral Daily Salary, Montell D, MD   1 tablet at 02/13/17 1049  .  ondansetron (ZOFRAN) tablet 4 mg  4 mg Oral Q6H PRN Salary, Montell D, MD       Or  . ondansetron (ZOFRAN) injection 4 mg  4 mg Intravenous Q6H PRN Salary, Montell D, MD      . simvastatin (ZOCOR) tablet 20 mg  20 mg Oral QPM Salary, Montell D, MD   20 mg at 02/13/17 1804  . sodium chloride flush (NS) 0.9 % injection 3 mL  3 mL Intravenous Q12H Salary, Montell D, MD   3 mL at 02/13/17 2126  . tamsulosin (FLOMAX) capsule 0.4 mg  0.4 mg Oral Daily Salary, Montell D, MD   0.4 mg at 02/13/17 1051  . torsemide (DEMADEX) tablet 100 mg  100 mg Oral Daily Salary, Montell D, MD   100 mg at 02/13/17 1049  . vancomycin (VANCOCIN) IVPB 750 mg/150 ml premix  750 mg Intravenous Q18H Salary, Evelena AsaMontell D, MD   Stopped at 02/14/17 1234  . Vitamin D (Ergocalciferol) (DRISDOL) capsule 50,000 Units  50,000 Units Oral Q30 days Bertrum SolSalary, Montell D, MD   50,000 Units at 02/12/17 [id number]     Discharge Medications: Please see discharge summary for a list of discharge medications.  Relevant Imaging Results:  Relevant Lab Results:   Additional Information (SSN: [id number])  Verdell Kincannon, Darleen CrockerBailey M, LCSW

## 2017-02-14 NOTE — Progress Notes (Signed)
Consent obtained over telephone from pt's daughter, Samuel JesterBrittany Glazebrook, witnessed by Avery DennisonBrooke RN and Armed forces logistics/support/administrative officerCathy RN.

## 2017-02-15 ENCOUNTER — Ambulatory Visit (INDEPENDENT_AMBULATORY_CARE_PROVIDER_SITE_OTHER): Payer: Medicare Other | Admitting: Vascular Surgery

## 2017-02-15 ENCOUNTER — Encounter: Payer: Self-pay | Admitting: Cardiology

## 2017-02-15 DIAGNOSIS — F0390 Unspecified dementia without behavioral disturbance: Secondary | ICD-10-CM

## 2017-02-15 DIAGNOSIS — F0391 Unspecified dementia with behavioral disturbance: Secondary | ICD-10-CM

## 2017-02-15 LAB — GLUCOSE, CAPILLARY
GLUCOSE-CAPILLARY: 312 mg/dL — AB (ref 65–99)
GLUCOSE-CAPILLARY: 203 mg/dL — AB (ref 65–99)
Glucose-Capillary: 286 mg/dL — ABNORMAL HIGH (ref 65–99)
Glucose-Capillary: 210 mg/dL — ABNORMAL HIGH (ref 65–99)

## 2017-02-15 LAB — BASIC METABOLIC PANEL
Anion gap: 7 (ref 5–15)
BUN: 48 mg/dL — AB (ref 6–20)
CALCIUM: 7.9 mg/dL — AB (ref 8.9–10.3)
CO2: 26 mmol/L (ref 22–32)
CREATININE: 1.59 mg/dL — AB (ref 0.61–1.24)
Chloride: 98 mmol/L — ABNORMAL LOW (ref 101–111)
GFR calc Af Amer: 50 mL/min — ABNORMAL LOW (ref >60)
GFR, EST NON AFRICAN AMERICAN: 43 mL/min — AB (ref >60)
GLUCOSE: 359 mg/dL — AB (ref 65–99)
POTASSIUM: 4.6 mmol/L (ref 3.5–5.1)
Sodium: 131 mmol/L — ABNORMAL LOW (ref 135–145)

## 2017-02-15 LAB — VANCOMYCIN, TROUGH
VANCOMYCIN TR: 30 ug/mL — AB (ref 15–20)
Vancomycin Tr: 14 ug/mL — ABNORMAL LOW (ref 15–20)

## 2017-02-15 MED ORDER — VANCOMYCIN HCL IN DEXTROSE 750-5 MG/150ML-% IV SOLN
750.0000 mg | INTRAVENOUS | Status: DC
  Filled 2017-02-15 (×2): qty 150

## 2017-02-15 MED ORDER — SODIUM CHLORIDE 0.9% FLUSH
10.0000 mL | Freq: Two times a day (BID) | INTRAVENOUS | Status: DC
  Administered 2017-02-15 – 2017-02-18 (×6): 10 mL

## 2017-02-15 MED ORDER — INSULIN ASPART 100 UNIT/ML ~~LOC~~ SOLN
3.0000 [IU] | Freq: Three times a day (TID) | SUBCUTANEOUS | Status: DC
  Administered 2017-02-15 – 2017-02-16 (×2): 3 [IU] via SUBCUTANEOUS
  Filled 2017-02-15 (×2): qty 1

## 2017-02-15 MED ORDER — VANCOMYCIN HCL IN DEXTROSE 1-5 GM/200ML-% IV SOLN
1000.0000 mg | INTRAVENOUS | Status: DC
  Administered 2017-02-15 – 2017-02-18 (×4): 1000 mg via INTRAVENOUS
  Filled 2017-02-15 (×7): qty 200

## 2017-02-15 MED ORDER — INSULIN GLARGINE 100 UNIT/ML ~~LOC~~ SOLN
6.0000 [IU] | Freq: Every day | SUBCUTANEOUS | Status: DC
  Administered 2017-02-15 – 2017-02-18 (×4): 6 [IU] via SUBCUTANEOUS
  Filled 2017-02-15 (×5): qty 0.06

## 2017-02-15 MED ORDER — TORSEMIDE 20 MG PO TABS
60.0000 mg | ORAL_TABLET | Freq: Every day | ORAL | Status: DC
  Administered 2017-02-16 – 2017-02-18 (×3): 60 mg via ORAL
  Filled 2017-02-15 (×3): qty 3

## 2017-02-15 MED ORDER — VANCOMYCIN HCL IN DEXTROSE 750-5 MG/150ML-% IV SOLN: 750.0000 mg | INTRAVENOUS | Status: DC | PRN

## 2017-02-15 MED ORDER — SODIUM CHLORIDE 0.9% FLUSH: 10.0000 mL | INTRAVENOUS | Status: DC | PRN

## 2017-02-15 NOTE — Progress Notes (Signed)
Inpatient Diabetes Program Recommendations  AACE/ADA: New Consensus Statement on Inpatient Glycemic Control (2015)  Target Ranges:  Prepandial:   less than 140 mg/dL      Peak postprandial:   less than 180 mg/dL (1-2 hours)      Critically ill patients:  140 - 180 mg/dL   Lab Results  Component Value Date   GLUCAP 312 (H) 02/15/2017   HGBA1C 9.8 (H) 02/09/2017    Review of Glycemic Control  Results for Jeremy Gutierrez, Clearance L (MRN [id number]) as of 02/15/2017 09:23  Ref. Range 02/14/2017 08:55 02/14/2017 11:54 02/14/2017 17:04 02/14/2017 22:06 02/15/2017 07:43  Glucose-Capillary Latest Ref Range: 65 - 99 mg/dL 213134 (H) 086127 (H) 578212 (H) 331 (H) 312 (H)    Diabetes history: DM Outpatient Diabetes medications: Novolog 75/25 mix 30 units ac breakfast + 25 units @ hs Current orders for Inpatient glycemic control: Novolog 0-15 units tid, Novolog 0-5 units qhs  Inpatient Diabetes Program Recommendations:  Please add basal insulin; Lantus insulin 6 units bid (A1C 9.8% on 55 units of 75/25 insulin at home)  Add Novolog 3 units tid with meals (hold if the patient eats less than 50%)- continue Novolog correction as ordered.   Susette RacerJulie Blonnie Maske, RN, BA, MHA, CDE Diabetes Coordinator Inpatient Diabetes Program  [phone] (Team Pager) [phone] [facility] Office) 02/15/2017 9:27 AM

## 2017-02-15 NOTE — Progress Notes (Signed)
Peripherally Inserted Central Catheter/Midline Placement  The IV Nurse has discussed with the patient and/or persons authorized to consent for the patient, the purpose of this procedure and the potential benefits and risks involved with this procedure.  The benefits include less needle sticks, lab draws from the catheter, and the patient may be discharged home with the catheter. Risks include, but not limited to, infection, bleeding, blood clot (thrombus formation), and puncture of an artery; nerve damage and irregular heartbeat and possibility to perform a PICC exchange if needed/ordered by physician.  Alternatives to this procedure were also discussed.  Bard Power PICC patient education guide, fact sheet on infection prevention and patient information card has been provided to patient /or left at bedside.    PICC/Midline Placement Documentation  PICC Single Lumen 02/15/17 PICC Right Brachial 42 cm 1 cm (Active)  Indication for Insertion or Continuance of Line Home intravenous therapies (PICC only) 02/15/2017  4:14 PM  Exposed Catheter (cm) 1 cm 02/15/2017  4:14 PM  Site Assessment Clean;Dry;Intact 02/15/2017  4:14 PM  Line Status Flushed;Saline locked;Blood return noted 02/15/2017  4:14 PM  Dressing Type Transparent 02/15/2017  4:14 PM  Dressing Status Clean;Dry;Intact;Antimicrobial disc in place 02/15/2017  4:14 PM  Dressing Change Due 02/22/17 02/15/2017  4:14 PM       Ethelda ChickCurrie, Canda Podgorski Robert 02/15/2017, 4:15 PM

## 2017-02-15 NOTE — Progress Notes (Signed)
Pharmacy Antibiotic Note  Jeremy Gutierrez is a 66 y.o. male re-admitted on 02/11/2017 with MRSA bacteremia.  Pharmacy has been consulted for vancomycin dosing.  Vancomycin Course: 12/21 Vancomycin 1250mg  IV q18h 12/23 0600 Vanc trough = 35 mcg/ml, however level drawn while drug infusing 12/23 2040 Vanc random = 3518mcg/ml, Vancomycin 1250mg  IV x 1 given 12/24 Vancomycin 750mg  IV q12h 12/26 0130 Vanc trough = 1324mcg/ml, change to Vancomycin 750mg  IV q18h 12/27 0149 Vanc troug = 1430mcg/ml, however level drawn while drug infusing. 12/27 1835 vanc trough 14 mcg/mL. Increase to 1 gm IV Q18H, predicted trough 19 mcg/mL.  Plan: Vancomycin is clearing appropriately. Increase dose modestly to 1 gm IV Q18H to target 15 to 20 mcg/mL (MRSA bacteremia). Pharmacy will continue to follow and adjust as needed to maintain trough 15 to 20 mcg/mL.   Height: 5\' 7"  (170.2 cm) Weight: 217 lb 3 oz (98.5 kg) IBW/kg (Calculated) : 66.1  Temp (24hrs), Avg:98.5 F (36.9 C), Min:97.8 F (36.6 C), Max:99.2 F (37.3 C)  Recent Labs  Lab 02/09/17 1207 02/09/17 1515  02/10/17 0659  02/11/17 0601 02/11/17 2041 02/12/17 0415 02/13/17 0424 02/14/17 0125 02/15/17 0149 02/15/17 1835  WBC 12.9*  --   --  13.1*  --  11.7* 13.7* 11.6*  --   --   --   --   CREATININE 1.33*  --    < > 1.51*  --  1.53* 1.58* 1.39* 1.53* 1.52* 1.59*  --   LATICACIDVEN 2.6* 2.1*  --   --   --   --   --   --   --   --   --   --   VANCOTROUGH  --   --   --   --    < > 35*  --   --   --  24* 30* 14*  VANCORANDOM  --   --   --   --   --   --  18  --   --   --   --   --    < > = values in this interval not displayed.    Estimated Creatinine Clearance: 49.7 mL/min (A) (by C-G formula based on SCr of 1.59 mg/dL (H)).    Allergies  Allergen Reactions  . Sulfa Antibiotics Hives    Patient states he is not allergic to this medication    Thank you for allowing pharmacy to be a part of this patient's care.  Merrily Tegeler A. Dahlia Bailiffookson, PharmD,  BCPS 02/15/2017 7:25 PM

## 2017-02-15 NOTE — Progress Notes (Signed)
Physical Therapy Treatment Patient Details Name: Jeremy Gutierrez MRN: [id number] DOB: 05/06/1950 Today's Date: 02/15/2017    History of Present Illness Pt is a 66 y/o M who recently left AMA after being treated for MRSA bacteremia secondary to bilateral lower extremity cellulitis/ulcerations, while at home patient was not able to take care of himself, patient was IVC by daughter as he could not take care of himself, telemetry psych ordered in the emergency room-patient declared incompetent, hospitalist asked to evaluate/admit for further care, patient evaluated in the emergency room, patient is confused/disoriented, patient is being readmitted for acute MRSA bacteremia secondary to bilateral lower extremity cellulitis/leg ulcerations.  Per chart review, plan is for PICC.  Pt's PMH includes aneurysm, CHF.    PT Comments    Pt presented in bed sleeping but easily aroused. Pt required moderate encouragement to participate in therapy. Pt noted to be incontinent of bladder and encouraged pt to perform OOB activity to change pad/brief. Pt performed supine to sit with minA and use of features. Pt performed sit to stand modA and extensive encouragement. Pt unable to maintain standing >431min due to fatigue. Pt becoming agitated when encouraged to perform additional sit/stand to complete peri-care. Pt able to perform additional stand but refused/unable to achieve full erect posture. Pt noted to have BM once standing but pt unaware. NT present to assist with additional per-care and pt able to tolerate standing x 2 min. Pt then returning to bed performing sit to supine with minA for LE placement and repositioning. Pt was total assist for scooting to [facility]. Pt left in bed with NT and RN present.    Follow Up Recommendations  SNF     Equipment Recommendations  Other (comment)    Recommendations for Other Services       Precautions / Restrictions Precautions Precautions: Fall;Other (comment) Precaution  Comments: pt impulsive Restrictions Weight Bearing Restrictions: No    Mobility  Bed Mobility Overal bed mobility: Needs Assistance Bed Mobility: Supine to Sit;Sit to Supine     Supine to sit: Min guard Sit to supine: Min assist   General bed mobility comments: increased time and use of featrues. Pt required assist x 2 for [facility]ooting to [facility]  Transfers Overall transfer level: Needs assistance Equipment used: Rolling walker (2 wheeled) Transfers: Stand Pivot Transfers Sit to Stand: Min assist         General transfer comment: cues for hand placement, increasing anterior wt shifting, required encouragement  Ambulation/Gait         Gait velocity: refused       Stairs            Wheelchair Mobility    Modified Rankin (Stroke Patients Only)       Balance Overall balance assessment: Needs assistance Sitting-balance support: No upper extremity supported;Feet supported Sitting balance-Leahy Scale: Fair Sitting balance - Comments: Pt frequently leaning on RW for support   Standing balance support: Bilateral upper extremity supported;During functional activity Standing balance-Leahy Scale: Poor Standing balance comment: heavy use of BUE on RW                            Cognition Arousal/Alertness: Awake/alert Behavior During Therapy: Flat affect;Impulsive Overall Cognitive Status: Difficult to assess  Exercises      General Comments        Pertinent Vitals/Pain Pain Assessment: No/denies pain    Home Living                      Prior Function            PT Goals (current goals can now be found in the care plan section) Acute Rehab PT Goals Patient Stated Goal: none stated PT Goal Formulation: With patient Time For Goal Achievement: 02/28/17 Potential to Achieve Goals: Fair Progress towards PT goals: Progressing toward goals    Frequency    Min 2X/week       PT Plan Current plan remains appropriate    Co-evaluation              AM-PAC PT "6 Clicks" Daily Activity  Outcome Measure  Difficulty turning over in bed (including adjusting bedclothes, sheets and blankets)?: Unable Difficulty moving from lying on back to sitting on the side of the bed? : Unable Difficulty sitting down on and standing up from a chair with arms (e.g., wheelchair, bedside commode, etc,.)?: Unable Help needed moving to and from a bed to chair (including a wheelchair)?: A Little Help needed walking in hospital room?: A Lot Help needed climbing 3-5 steps with a railing? : A Lot 6 Click Score: 10    End of Session Equipment Utilized During Treatment: Gait belt Activity Tolerance: Patient limited by fatigue Patient left: in bed;with call bell/phone within reach;with nursing/sitter in room   PT Visit Diagnosis: Unsteadiness on feet (R26.81);Other abnormalities of gait and mobility (R26.89);Muscle weakness (generalized) (M62.81);Difficulty in walking, not elsewhere classified (R26.2)     Time: [id number] PT Time Calculation (min) (ACUTE ONLY): 24 min  Charges:  $Therapeutic Activity: 23-37 mins                      Isys Tietje  Salvatrice Morandi, PTA 02/15/2017, 12:18 PM

## 2017-02-15 NOTE — Care Management Note (Addendum)
Case Management Note  Patient Details  Name: Jeremy Gutierrez MRN: [id number] Date of Birth: 12/17/1950  Subjective/Objective:  Admitted to [facility] with the diagnosis of bacteremia. IVC protocol in place.  Discharged from this facility 02/09/17.     Lives with daughter. Daughter listed as Jeremy Gutierrez [phone]).  Seen Dr. Wyn Quakerew last visit due to right and left lower leg ulcers. Followed up with Dr. Wyn Quakerew 01/23/17 in the office prior to the above admission.  TEE 02/14/17. PICC placed. Will need 6 weeks of IV Vancomycin per Dr. Elpidio AnisSudini.  Psych consult pending. IVC protocol in place              Action/Plan: physical therapy evaluation completed. Recommending skilled nursing facility.  Received referral for 6 weeks vancomycin. Will continue to follow, if Vancomycin is given in the home.  Expected Discharge Date:                  Expected Discharge Plan:     In-House Referral:    yes Discharge planning Services     Post Acute Care Choice:    Choice offered to:     DME Arranged:    DME Agency:     HH Arranged:    HH Agency:     Status of Service:     If discussed at [employer] Meetings, dates discussed:    Additional Comments:  Gwenette GreetBrenda S Josie Burleigh, RN MSN CCM Care management [phone] 02/15/2017, 8:41 AM

## 2017-02-15 NOTE — Progress Notes (Signed)
PT Cancellation Note  Patient Details Name: Jeremy AbbeyBarry L Williams MRN: [id number] DOB: 10/30/1950   Cancelled Treatment:      Per sitter, pt just fell asleep, will re-attempt in pm.    Sanjuan Sawa  Gurbani Figge, PTA 02/15/2017, 9:52 AM

## 2017-02-15 NOTE — Consult Note (Signed)
WOC Nurse wound follow up Wound type: Chronic venous insufficiency Dressing procedure/placement/frequency:Unnas boots are changed today per MD order.  Sitter at bedside.  WOC team will follow.   Maple HudsonKaren Yarelie Hams RN BSN CWON Pager [phone]

## 2017-02-15 NOTE — Progress Notes (Signed)
Clinical Education officer, museum (CSW) met with patient alone at bedside to discuss D/C plan. Patient adamantly refused SNF. CSW contacted patient's daughter [place] and made her aware of above. [place] believes that patient is not "competent" and she is pursing guardianship. Psych consult for capacity is pending. CSW presented bed offers. Daughter chose [facility]. CSW contacted [facility] admissions coordinator at [facility] and made her aware of above. Per Claib[facility] Billings they will have a private room for patient.   [employer], LCSW [phone]

## 2017-02-15 NOTE — Progress Notes (Addendum)
[facility] [place] [facility]   PATIENT NAME: Jeremy Gutierrez    MR#:  [id number]  DATE OF BIRTH:  04/14/1950  SUBJECTIVE:  CHIEF COMPLAINT:   Chief Complaint  Patient presents with  . Medical Clearance  . Failure To Thrive    No complaints Flat affect Afebrile  IVC in place.  He was deemed incompetent to make healthcare decisions in the emergency room by tele psych. His confusion is improved.  REVIEW OF SYSTEMS:    Review of Systems  Constitutional: Negative for chills, fever and malaise/fatigue.  HENT: Negative for sore throat.   Eyes: Negative for blurred vision, double vision and pain.  Respiratory: Negative for cough, hemoptysis, shortness of breath and wheezing.   Cardiovascular: Negative for chest pain, palpitations, orthopnea and leg swelling.  Gastrointestinal: Negative for abdominal pain, constipation, diarrhea, heartburn, nausea and vomiting.  Genitourinary: Negative for dysuria and hematuria.  Musculoskeletal: Negative for back pain and joint pain.  Skin: Negative for rash.  Neurological: Negative for sensory change, speech change, focal weakness, weakness and headaches.  Endo/Heme/Allergies: Does not bruise/bleed easily.  Psychiatric/Behavioral: Negative for depression. The patient is not nervous/anxious.     DRUG ALLERGIES:   Allergies  Allergen Reactions  . Sulfa Antibiotics Hives    Patient states he is not allergic to this medication    VITALS:  Blood pressure 128/74, pulse 92, temperature 98.3 F (36.8 C), temperature source Oral, resp. rate 20, height 5\' 7"  (1.702 m), weight 98.5 kg (217 lb 3 oz), SpO2 97 %.  PHYSICAL EXAMINATION:   Physical Exam  GENERAL:  66 y.o.-year-old patient lying in the bed with no acute distress.  EYES: Pupils equal, round, reactive to light and accommodation. No scleral icterus. Extraocular muscles intact.  HEENT: Head atraumatic, normocephalic. Oropharynx and nasopharynx clear.  NECK:  Supple,  no jugular venous distention. No thyroid enlargement, no tenderness.  LUNGS: Normal breath sounds bilaterally, no wheezing, rales, rhonchi. No use of accessory muscles of respiration.  CARDIOVASCULAR: S1, S2 normal. No murmurs, rubs, or gallops.  ABDOMEN: Soft, nontender, nondistended. Bowel sounds present. No organomegaly or mass.  EXTREMITIES: B/L LE unna boots NEUROLOGIC: Cranial nerves II through XII are intact. No focal Motor or sensory deficits b/l.   PSYCHIATRIC: The patient is alert and awake SKIN: No obvious rash, lesion, or ulcer.   LABORATORY PANEL:   CBC Recent Labs  Lab 02/12/17 0415  WBC 11.6*  HGB 16.0  HCT 49.0  PLT 116*   ------------------------------------------------------------------------------------------------------------------ Chemistries  Recent Labs  Lab 02/09/17 1207  02/15/17 0149  NA 135   < > 131*  K 4.7   < > 4.6  CL 100*   < > 98*  CO2 25   < > 26  GLUCOSE 240*   < > 359*  BUN 35*   < > 48*  CREATININE 1.33*   < > 1.59*  CALCIUM 8.9   < > 7.9*  AST 42*  --   --   ALT 27  --   --   ALKPHOS 93  --   --   BILITOT 3.0*  --   --    < > = values in this interval not displayed.   ------------------------------------------------------------------------------------------------------------------  Cardiac Enzymes No results for input(s): TROPONINI in the last 168 hours. ------------------------------------------------------------------------------------------------------------------  RADIOLOGY:  No results found.   ASSESSMENT AND PLAN:   * Uncontrolled DM with hypoglycemia Stop D5-NS Insulin 70/30 discontinued. He seems to be sensitive to insulin and likely  was taking it at home. Will start with lantus 6 units daily and add pre-meal novolog 3 untis WM. Titrate up slowly as needed.  *MSSA and Staphylococcus hemolyticus bacteremia TEE shows mitral valve vegetation.  Patient will likely need mitral valve replacement as per Dr. Lady GaryFath of  cardiology. Discuss[facility] with [facility] Orvan Falconerampbell of infectious [facility] at [facility].  Advised PICC line and vancomycin through IV for 6 weeks. Patient will need cardiothoracic evaluation once infection clears for MV replacement  * Acute metabolic encephalopathy due to above Improved  * Hypertension Hold medications  * Chronic diastolic CHF LV EF: 60% - 65%. On torsemide  * CKD stage III.  Stable.  * Thrombocytopenia, mild Due to bacteremia likely No bleeding  * Depression IVC in place.  Psychiatry consulted  SNF at discharge  All the records are reviewed and case discussed with Care Management/Social Worker Management plans discussed with the patient, family and they are in agreement.  CODE STATUS: FULL CODE  DVT Prophylaxis: SCDs  TOTAL CC TIME TAKING CARE OF THIS PATIENT: 35 minutes.   POSSIBLE D/C IN 1-2 DAYS, DEPENDING ON CLINICAL CONDITION.  Molinda BailiffSrikar R Elainah Rhyne M.D on 02/15/2017 at 12:17 PM  Between 7am to 6pm - Pager - [phone]  After 6pm go to [email or web address] - password [email or web address] [facility]  [facility] [place] [facility]  Office  [phone]  CC: Primary care physician; Derwood KaplanEason, Ernest B, MD  Note: This dictation was prepared with Dragon dictation along with smaller phrase technology. Any transcriptional errors that result from this process are unintentional.

## 2017-02-15 NOTE — Progress Notes (Signed)
Pharmacy Antibiotic Note  Jeremy Gutierrez is a 66 y.o. male re-admitted on 02/11/2017 with MRSA bacteremia.  Pharmacy has been consulted for vancomycin dosing.  Plan: Patient was previously here earlier in the day and was receiving vanc 1.25g IV q18h. Trough was drawn after dose was running and was 35 mcg/mL; however, vanc was discontinue because Scr was rising. Random level was drawn when patient readmitted: 12/23 @ 2041 VR 18 and patient was given vanc 1.25g IV x 1 in the ED  Ke 0.047 T1/2 14 ~ 12 hrs Vd 57L AjBW 81.4 kg CrCl 51.5 ml/min  In 12 hrs patient will be at approximately 20 mcg/mL -- Will start patient on vanc 750 mg IV q12h. Will check a vanc trough 12/26 @ 0000 prior to 4th dose. Css 18 mcg/mL Will draw vanc trough sooner if renal function declines further -- Scr 1.33 >> 1.40 >> 1.51 >> 1.53 >> 1.58  12/26 0130 vanc level 24. Changed to 750 mg q 18 hours. Level before 3rd new dose to ensure clearance.  12/27 0149 vanc level 30. Apparently drawn while infusing. Will recheck in 18 hours to confirm clearance. Vanc order changed to PRN for place holder.   Height: 5\' 7"  (170.2 cm) Weight: 224 lb (101.6 kg) IBW/kg (Calculated) : 66.1  Temp (24hrs), Avg:98.9 F (37.2 C), Min:98.3 F (36.8 C), Max:100.2 F (37.9 C)  Recent Labs  Lab 02/09/17 1207 02/09/17 1515  02/10/17 0659  02/11/17 0601 02/11/17 2041 02/12/17 0415 02/13/17 0424 02/14/17 0125 02/15/17 0149  WBC 12.9*  --   --  13.1*  --  11.7* 13.7* 11.6*  --   --   --   CREATININE 1.33*  --    < > 1.51*  --  1.53* 1.58* 1.39* 1.53* 1.52* 1.59*  LATICACIDVEN 2.6* 2.1*  --   --   --   --   --   --   --   --   --   VANCOTROUGH  --   --   --   --    < > 35*  --   --   --  24* 30*  VANCORANDOM  --   --   --   --   --   --  18  --   --   --   --    < > = values in this interval not displayed.    Estimated Creatinine Clearance: 50.5 mL/min (A) (by C-G formula based on SCr of 1.59 mg/dL (H)).    Allergies   Allergen Reactions  . Sulfa Antibiotics Hives    Patient states he is not allergic to this medication    Thank you for allowing pharmacy to be a part of this patient's care.  Thomasene Rippleavid Besanti, PharmD, BCPS Clinical Pharmacist 02/15/2017

## 2017-02-15 NOTE — Consult Note (Signed)
[facility] Consult   Reason for Consult: Consult for 66 year old man currently in the hospital with cellulitis recovery from sepsis diabetes.  Concern about capacity Referring Physician:  Sudini Patient Identification: Jeremy Gutierrez MRN:  [id number] Principal Diagnosis: Dementia Diagnosis:   Patient Active Problem List   Diagnosis Date Noted  . Dementia [F03.90] 02/15/2017  . Pressure injury of skin [L89.90] 02/13/2017  . MRSA bacteremia [R78.81] 02/11/2017  . Sepsis ([place]) [A41.9] 02/09/2017  . Tobacco use disorder [F17.200] 01/23/2017  . Hyperglycemia [R73.9] 04/02/2016  . Essential hypertension, benign [I10] 02/29/2016  . AAA (abdominal aortic aneurysm) without rupture ([place]) [I71.4] 02/29/2016  . Diabetes ([place]) [E11.9] 02/29/2016  . Abscess of right thigh [L02.415] 10/29/2012    Total Time spent with patient: 1 hour  Subjective:   Jeremy Gutierrez is a 66 y.o. male patient admitted with "weak legs".  HPI: Patient interviewed chart reviewed.  Follow-up from yesterday is attempted consult.  66 year old man who is in the hospital with recovery from sepsis.  Concern was raised about his capacity to make appropriate decisions.  Patient had been in the hospital earlier and had abruptly left AGAINST MEDICAL ADVICE and rapidly decompensated at home requiring readmission.  There is concern on the part of the treatment team about his ability to make reasonable decisions.   as a result the patient is currently under involuntary commitment.  Spoke with the patient today.  Quality of the interview was very impaired by the patient's lack of cooperation.  Patient answered only a few of my questions and usually only partially.  Made little or no eye contact during the time I was in the room.  This is despite the fact that he was clearly wide awake and alert and was eating during the interview.  Patient was able to tell me that he knows he is in the hospital.  He cannot tell me exactly  why.  He says that he is here for "weak legs".  When I ask him to explain more of that and tell me anything else he knew about his medical condition and he said that there was nothing else that he knew about it.  I asked him if he remembered having been in the hospital and leaving and then coming back.  He said that he did remember this.  I asked him to explain what happened and he could not come up with any lucid description.  Could not tell me why he left.  Could not really tell me why he came back.  I asked him about the insertion of the PICC line today.  Patient had no idea what I was talking about.  Said that he had no memory of anyone discussing it with him.  Patient was not cooperative with further psychiatric discussion.  Social history: Evidently lives at home with family.  Family reportedly are concerned about his decision making.  Medical history: Patient has diabetes hypertension history of aortic aneurysm currently with sepsis bacteremia possible spreading of infection to other organs.  Substance abuse history: Did not answer any questions.  Nothing in the old chart about this.  Past Psychiatric History: There is no known past psychiatric history.  No previous note "psychiatry.  Patient did not answer any questions concerning this.  No known history of suicidality.  Risk to Self: Is patient at risk for suicide?: No Risk to Others:   Prior Inpatient Therapy:   Prior Outpatient Therapy:    Past Medical History:  Past Medical  History:  Diagnosis Date  . Aneurysm ([place])   . Arthritis   . CHF (congestive heart failure) ([place])   . Diabetes mellitus without complication ([place])   . Heart murmur    as a child- 'nothing to worry about'  . Sleep apnea     Past Surgical History:  Procedure Laterality Date  . BUNIONECTOMY Right   . CARDIAC CATHETERIZATION  2010  . CATARACT EXTRACTION W/PHACO Right 06/05/2012   Procedure: CATARACT EXTRACTION PHACO AND INTRAOCULAR LENS PLACEMENT (IOC);   Surgeon: Adonis Brook, MD;  Location: [facility];  Service: Ophthalmology;  Laterality: Right;  . Colonscopy  2012  . TEE WITHOUT CARDIOVERSION N/A 02/14/2017   Procedure: TRANSESOPHAGEAL ECHOCARDIOGRAM (TEE);  Surgeon: Teodoro Spray, MD;  Location: [facility];  Service: Cardiovascular;  Laterality: N/A;   Family History:  Family History  Problem Relation Age of Onset  . CAD Mother   . Cancer Sister    Family Psychiatric  History: Unknown Social History:  Social History   Substance and Sexual Activity  Alcohol Use Yes   Comment: social     Social History   Substance and Sexual Activity  Drug Use No    Social History   Socioeconomic History  . Marital status: Divorced    Spouse name: None  . Number of children: None  . Years of education: None  . Highest education level: None  Social Needs  . Financial resource strain: None  . Food insecurity - worry: None  . Food insecurity - inability: None  . Transportation needs - medical: None  . Transportation needs - non-medical: None  Occupational History  . None  Tobacco Use  . Smoking status: Current Every Day Smoker    Packs/day: 0.50    Years: 40.00    Pack years: 20.00    Types: Cigarettes    Last attempt to quit: 06/02/2012    Years since quitting: 4.7  . Smokeless tobacco: Never Used  . Tobacco comment: smoked for many years.  Substance and Sexual Activity  . Alcohol use: Yes    Comment: social  . Drug use: No  . Sexual activity: None  Other Topics Concern  . None  Social History Narrative  . None   Additional Social History:    Allergies:   Allergies  Allergen Reactions  . Sulfa Antibiotics Hives    Patient states he is not allergic to this medication    Labs:  Results for orders placed or performed during the hospital encounter of 02/11/17 (from the past 48 hour(s))  Glucose, capillary     Status: Abnormal   Collection Time: 02/13/17  9:20 PM  Result Value Ref Range   Glucose-Capillary 158 (H) 65 -  99 mg/dL   Comment 1 Notify RN   Vancomycin, trough     Status: Abnormal   Collection Time: 02/14/17  1:25 AM  Result Value Ref Range   Vancomycin Tr 24 (HH) 15 - 20 ug/mL    Comment: CRITICAL RESULT CALLED TO, READ BACK BY AND VERIFIED WITH MATT MCBANE AT 0217 02/14/17 ALV Performed at [facility], [address] [place], [place] [address]   Creatinine, serum     Status: Abnormal   Collection Time: 02/14/17  1:25 AM  Result Value Ref Range   Creatinine, Ser 1.52 (H) 0.61 - 1.24 mg/dL   GFR calc non Af Amer 45 (L) >60 mL/min   GFR calc Af Amer 53 (L) >60 mL/min    Comment: (NOTE) The  eGFR has been calculated using the CKD EPI equation. This calculation has not been validated in all clinical situations. eGFR's persistently <60 mL/min signify possible Chronic Kidney Disease. Performed at [facility], [address] [place], [place] [address]   Glucose, capillary     Status: None   Collection Time: 02/14/17  7:35 AM  Result Value Ref Range   Glucose-Capillary 74 65 - 99 mg/dL  Glucose, capillary     Status: None   Collection Time: 02/14/17  8:22 AM  Result Value Ref Range   Glucose-Capillary 68 65 - 99 mg/dL  Glucose, capillary     Status: Abnormal   Collection Time: 02/14/17  8:55 AM  Result Value Ref Range   Glucose-Capillary 134 (H) 65 - 99 mg/dL  Glucose, capillary     Status: Abnormal   Collection Time: 02/14/17 11:54 AM  Result Value Ref Range   Glucose-Capillary 127 (H) 65 - 99 mg/dL  Glucose, capillary     Status: Abnormal   Collection Time: 02/14/17  5:04 PM  Result Value Ref Range   Glucose-Capillary 212 (H) 65 - 99 mg/dL  Glucose, capillary     Status: Abnormal   Collection Time: 02/14/17 10:06 PM  Result Value Ref Range   Glucose-Capillary 331 (H) 65 - 99 mg/dL   Comment 1 Notify RN   Vancomycin, trough     Status: Abnormal   Collection Time: 02/15/17  1:49 AM  Result Value Ref Range   Vancomycin Tr 30 (HH) 15 - 20 ug/mL     Comment: CRITICAL RESULT CALLED TO, READ BACK BY AND VERIFIED WITH MATTHEW MCBANE AT 0220 02/15/17 ALV Performed at [facility], [address] [place], [place] [address]   Basic metabolic panel     Status: Abnormal   Collection Time: 02/15/17  1:49 AM  Result Value Ref Range   Sodium 131 (L) 135 - 145 mmol/L   Potassium 4.6 3.5 - 5.1 mmol/L   Chloride 98 (L) 101 - 111 mmol/L   CO2 26 22 - 32 mmol/L   Glucose, Bld 359 (H) 65 - 99 mg/dL   BUN 48 (H) 6 - 20 mg/dL   Creatinine, Ser 1.59 (H) 0.61 - 1.24 mg/dL   Calcium 7.9 (L) 8.9 - 10.3 mg/dL   GFR calc non Af Amer 43 (L) >60 mL/min   GFR calc Af Amer 50 (L) >60 mL/min    Comment: (NOTE) The eGFR has been calculated using the CKD EPI equation. This calculation has not been validated in all clinical situations. eGFR's persistently <60 mL/min signify possible Chronic Kidney Disease.    Anion gap 7 5 - 15    Comment: Performed at [facility], [address] [place], [place] [address]  Glucose, capillary     Status: Abnormal   Collection Time: 02/15/17  7:43 AM  Result Value Ref Range   Glucose-Capillary 312 (H) 65 - 99 mg/dL  Glucose, capillary     Status: Abnormal   Collection Time: 02/15/17 11:37 AM  Result Value Ref Range   Glucose-Capillary 286 (H) 65 - 99 mg/dL  Glucose, capillary     Status: Abnormal   Collection Time: 02/15/17  4:27 PM  Result Value Ref Range   Glucose-Capillary 210 (H) 65 - 99 mg/dL   Comment 1 Notify RN   Vancomycin, trough     Status: Abnormal   Collection Time: 02/15/17  6:35 PM  Result Value Ref Range   Vancomycin Tr 14 (L) 15 -  20 ug/mL    Comment: Performed at [facility], [address] [place], [place] [address]    Current Facility-Administered Medications  Medication Dose Route Frequency Provider Last Rate Last Dose  . acetaminophen (TYLENOL) tablet 650 mg  650 mg Oral Q6H PRN Loney Hering D, MD   650 mg at 02/13/17 2126   Or  . acetaminophen (TYLENOL)  suppository 650 mg  650 mg Rectal Q6H PRN Salary, Montell D, MD      . aspirin EC tablet 81 mg  81 mg Oral Daily Salary, Montell D, MD   81 mg at 02/15/17 0946  . carvedilol (COREG) tablet 3.125 mg  3.125 mg Oral BID WC Salary, Montell D, MD   3.125 mg at 02/15/17 1655  . enoxaparin (LOVENOX) injection 40 mg  40 mg Subcutaneous Q24H Hillary Bow, MD   40 mg at 02/15/17 1510  . HYDROcodone-acetaminophen (NORCO/VICODIN) 5-325 MG per tablet 1-2 tablet  1-2 tablet Oral Q4H PRN Salary, Montell D, MD   2 tablet at 02/12/17 2143  . hydrOXYzine (ATARAX/VISTARIL) tablet 25 mg  25 mg Oral TID PRN Salary, Montell D, MD      . insulin aspart (novoLOG) injection 0-15 Units  0-15 Units Subcutaneous TID WC Salary, Montell D, MD   5 Units at 02/15/17 1655  . insulin aspart (novoLOG) injection 0-5 Units  0-5 Units Subcutaneous QHS Loney Hering D, MD   4 Units at 02/14/17 2218  . insulin aspart (novoLOG) injection 3 Units  3 Units Subcutaneous TID WC Hillary Bow, MD   3 Units at 02/15/17 1656  . insulin glargine (LANTUS) injection 6 Units  6 Units Subcutaneous Daily Hillary Bow, MD   6 Units at 02/15/17 1510  . montelukast (SINGULAIR) tablet 10 mg  10 mg Oral QHS Salary, Montell D, MD   10 mg at 02/14/17 2218  . multivitamin with minerals tablet 1 tablet  1 tablet Oral Daily Salary, Avel Peace, MD   1 tablet at 02/15/17 0946  . ondansetron (ZOFRAN) tablet 4 mg  4 mg Oral Q6H PRN Salary, Montell D, MD       Or  . ondansetron (ZOFRAN) injection 4 mg  4 mg Intravenous Q6H PRN Salary, Montell D, MD      . simvastatin (ZOCOR) tablet 20 mg  20 mg Oral QPM Salary, Montell D, MD   20 mg at 02/15/17 1731  . sodium chloride flush (NS) 0.9 % injection 10-40 mL  10-40 mL Intracatheter Q12H Sudini, Srikar, MD      . sodium chloride flush (NS) 0.9 % injection 10-40 mL  10-40 mL Intracatheter PRN Sudini, Srikar, MD      . sodium chloride flush (NS) 0.9 % injection 3 mL  3 mL Intravenous Q12H Salary, Montell D, MD   3 mL  at 02/13/17 2126  . tamsulosin (FLOMAX) capsule 0.4 mg  0.4 mg Oral Daily Salary, Montell D, MD   0.4 mg at 02/15/17 0946  . [START ON 02/16/2017] torsemide (DEMADEX) tablet 60 mg  60 mg Oral Daily Sudini, Srikar, MD      . vancomycin (VANCOCIN) IVPB 750 mg/150 ml premix  750 mg Intravenous Q18H Sudini, Alveta Heimlich, MD      . Vitamin D (Ergocalciferol) (DRISDOL) capsule 50,000 Units  50,000 Units Oral Q30 days Gorden Harms, MD   50,000 Units at 02/12/17 0905    Musculoskeletal: Strength & Muscle Tone: decreased Gait & Station: unable to stand Patient leans: N/A  Psychiatric Specialty Exam: Physical  Exam  Nursing note and vitals reviewed. Constitutional: He appears well-developed and well-nourished.  HENT:  Head: Normocephalic and atraumatic.  Eyes: Conjunctivae are normal. Pupils are equal, round, and reactive to light.  Neck: Normal range of motion.  Cardiovascular: Regular rhythm and normal heart sounds.  Respiratory: Effort normal. No respiratory distress.  GI: Soft.  Musculoskeletal: Normal range of motion.  Neurological: He is alert.  Skin: Skin is warm and dry.     Psychiatric: His affect is blunt. His speech is delayed. He is slowed. Cognition and memory are impaired. He expresses inappropriate judgment. He expresses no suicidal ideation.    Review of Systems  Unable to perform ROS: Other    Blood pressure 120/70, pulse 82, temperature 97.8 F (36.6 C), temperature source Oral, resp. rate 16, height 5' 7"  (1.702 m), weight 217 lb 3 oz (98.5 kg), SpO2 97 %.Body mass index is 34.02 kg/m.  General Appearance: Casual  Eye Contact:  Minimal  Speech:  Slow  Volume:  Decreased  Mood:  Euthymic  Affect:  Constricted  Thought Process:  NA  Orientation:  Other:  Oriented to being in the hospital but not to any details about his condition.  Thought Content:  Illogical  Suicidal Thoughts:  No  Homicidal Thoughts:  No  Memory:  Negative  Judgement:  Poor  Insight:  Lacking    Psychomotor Activity:  Decreased  Concentration:  Concentration: Poor  Recall:  Poor  Fund of Knowledge:  Poor  Language:  Poor  Akathisia:  No  Handed:  Right  AIMS (if indicated):     Assets:  Social Support  ADL's:  Impaired  Cognition:  Impaired,  Mild  Sleep:        Treatment Plan Summary: Plan Difficult assessment.  Patient's lack of cooperation very much limits any diagnosis or treatment plan.  On the other hand, I think I can say that he currently lacks capacity to make reasonable decisions.  I could not get any evidence in the conversation that he understands his medical condition at all nor does he show any interest in learning anything about it or thinking about it from what I can tell.  Differential diagnosis would include depression as well as dementia.  His lack of interaction however he could be she just some lasting cognitive impairment.  Although I do not think he requires or would benefit from psychiatric hospitalization for the moment I will continue the commitment since it allows [place] to keep a sitter in the room for safety.  I will follow up tomorrow.  No need for any new prescriptions.  Disposition: Patient does not meet criteria for psychiatric inpatient admission.  Alethia Berthold, MD 02/15/2017 7:08 PM

## 2017-02-15 NOTE — Progress Notes (Signed)
Pharmacy Antibiotic Note  Jeremy Gutierrez is a 66 y.o. male re-admitted on 02/11/2017 with MRSA bacteremia.  Pharmacy has been consulted for vancomycin dosing.  Vancomycin Course: 12/21 Vancomycin 1250mg  IV q18h 12/23 0600 Vanc trough = 35 mcg/ml, however level drawn while drug infusing 12/23 2040 Vanc random = 2718mcg/ml, Vancomycin 1250mg  IV x 1 given 12/24 Vancomycin 750mg  IV q12h 12/26 0130 Vanc trough = 1724mcg/ml, change to Vancomycin 750mg  IV q18h 12/27 0149 Vanc troug = 2930mcg/ml, however level drawn while drug infusing.  Plan: Will continue with Vancomycin 750mg  IV q18h. Will check a Vancomycin trough level prior to next dose to ensure clearance.   Ke 0.047 T1/2 14 ~ 12 hrs Vd 57L AjBW 81.4 kg CrCl 51.5 ml/min  Height: 5\' 7"  (170.2 cm) Weight: 217 lb 3 oz (98.5 kg) IBW/kg (Calculated) : 66.1  Temp (24hrs), Avg:99 F (37.2 C), Min:98.3 F (36.8 C), Max:100.2 F (37.9 C)  Recent Labs  Lab 02/09/17 1207 02/09/17 1515  02/10/17 0659  02/11/17 0601 02/11/17 2041 02/12/17 0415 02/13/17 0424 02/14/17 0125 02/15/17 0149  WBC 12.9*  --   --  13.1*  --  11.7* 13.7* 11.6*  --   --   --   CREATININE 1.33*  --    < > 1.51*  --  1.53* 1.58* 1.39* 1.53* 1.52* 1.59*  LATICACIDVEN 2.6* 2.1*  --   --   --   --   --   --   --   --   --   VANCOTROUGH  --   --   --   --    < > 35*  --   --   --  24* 30*  VANCORANDOM  --   --   --   --   --   --  18  --   --   --   --    < > = values in this interval not displayed.    Estimated Creatinine Clearance: 49.7 mL/min (A) (by C-G formula based on SCr of 1.59 mg/dL (H)).    Allergies  Allergen Reactions  . Sulfa Antibiotics Hives    Patient states he is not allergic to this medication    Thank you for allowing pharmacy to be a part of this patient's care.  Clovia CuffLisa Ezma Rehm, PharmD, BCPS 02/15/2017 9:43 AM

## 2017-02-16 LAB — GLUCOSE, CAPILLARY
GLUCOSE-CAPILLARY: 173 mg/dL — AB (ref 65–99)
GLUCOSE-CAPILLARY: 209 mg/dL — AB (ref 65–99)
Glucose-Capillary: 184 mg/dL — ABNORMAL HIGH (ref 65–99)
Glucose-Capillary: 126 mg/dL — ABNORMAL HIGH (ref 65–99)

## 2017-02-16 LAB — CULTURE, BLOOD (ROUTINE X 2)
Culture: NO GROWTH
SPECIAL REQUESTS: ADEQUATE

## 2017-02-16 MED ORDER — INSULIN ASPART 100 UNIT/ML ~~LOC~~ SOLN
5.0000 [IU] | Freq: Three times a day (TID) | SUBCUTANEOUS | Status: DC
  Administered 2017-02-16 – 2017-02-18 (×7): 5 [IU] via SUBCUTANEOUS
  Filled 2017-02-16 (×7): qty 1

## 2017-02-16 NOTE — Progress Notes (Signed)
[facility] [place] [facility]   PATIENT NAME: Jeremy LofflerBarry Gutierrez    MR#:  [id number]  DATE OF BIRTH:  09/13/1950  SUBJECTIVE:  Patient without any issues overnight he is sleepy today  REVIEW OF SYSTEMS:    Review of Systems  Constitutional: Negative for fever, chills weight loss HENT: Negative for ear pain, nosebleeds, congestion, facial swelling, rhinorrhea, neck pain, neck stiffness and ear discharge.   Respiratory: Negative for cough, shortness of breath, wheezing  Cardiovascular: Negative for chest pain, palpitations and leg swelling.  Gastrointestinal: Negative for heartburn, abdominal pain, vomiting, diarrhea or consitpation Genitourinary: Negative for dysuria, urgency, frequency, hematuria Musculoskeletal: Negative for back pain or joint pain Neurological: Negative for dizziness, seizures, syncope, focal weakness,  numbness and headaches.  Hematological: Does not bruise/bleed easily.  Psychiatric/Behavioral: Negative for hallucinations, confusion, dysphoric mood    Tolerating Diet: yes      DRUG ALLERGIES:   Allergies  Allergen Reactions  . Sulfa Antibiotics Hives    Patient states he is not allergic to this medication    VITALS:  Blood pressure 126/79, pulse 89, temperature 98 F (36.7 C), temperature source Oral, resp. rate 17, height 5\' 7"  (1.702 m), weight 103.2 kg (227 lb 9.6 oz), SpO2 99 %.  PHYSICAL EXAMINATION:  Constitutional: Appears well-developed and well-nourished. No distress. HENT: Normocephalic. Marland Kitchen. Oropharynx is clear and moist.  Eyes: Conjunctivae and EOM are normal. PERRLA, no scleral icterus.  Neck: Normal ROM. Neck supple. No JVD. No tracheal deviation. CVS: RRR, S1/S2 +, no murmurs, no gallops, no carotid bruit.  Pulmonary: Effort and breath sounds normal, no stridor, rhonchi, wheezes, rales.  Abdominal: Soft. BS +,  no distension, tenderness, rebound or guarding.  Musculoskeletal: Normal range of motion. No edema and no  tenderness.  Neuro: Alert. CN 2-12 grossly intact. No focal deficits. Skin: Skin is warm and dry. No rash noted. Psychiatric: Normal mood and affect.      LABORATORY PANEL:   CBC Recent Labs  Lab 02/12/17 0415  WBC 11.6*  HGB 16.0  HCT 49.0  PLT 116*   ------------------------------------------------------------------------------------------------------------------  Chemistries  Recent Labs  Lab 02/09/17 1207  02/15/17 0149  NA 135   < > 131*  K 4.7   < > 4.6  CL 100*   < > 98*  CO2 25   < > 26  GLUCOSE 240*   < > 359*  BUN 35*   < > 48*  CREATININE 1.33*   < > 1.59*  CALCIUM 8.9   < > 7.9*  AST 42*  --   --   ALT 27  --   --   ALKPHOS 93  --   --   BILITOT 3.0*  --   --    < > = values in this interval not displayed.   ------------------------------------------------------------------------------------------------------------------  Cardiac Enzymes No results for input(s): TROPONINI in the last 168 hours. ------------------------------------------------------------------------------------------------------------------  RADIOLOGY:  No results found.   ASSESSMENT AND PLAN:    66 year old male with chronic diastolic heart failure, diabetes who presented after leaving AMA due to inability to take care of himself.   1. 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  2. Acute metabolic encephalopathy due to above  3. Uncontrolled diabetes: Continue Lantus and NovoLog Diabetes nurse consult appreciated  4. Chronic diastolic heart failure  with preserved ejection fraction: Continue torsemide  5. Chronic kidney disease stage III: Creatinine stable  6. Thrombocytopenia, mild E to bacteremia  7. Depression: Psychiatry is following Patient  currently has sitter.  Psychiatry is seeing the patient lacks capacity to make reasonable decisions. Family is looking into pursuing guardianship.   8. Essential hypertension: Continue Coreg On. BPH: Continue Flomax  Management plans discussed with the patient and he is in agreement.  CODE STATUS: FULL  TOTAL TIME TAKING CARE OF THIS PATIENT: 22 minutes.     POSSIBLE D/C tomorrow to SNF needs 24 hours no sitter, DEPENDING ON CLINICAL CONDITION.   Salil Raineri M.D on 02/16/2017 at 11:23 AM  Between 7am to 6pm - Pager - [phone] After 6pm go to [email or web address] - password [email or web address]  [facility]  Office  [phone]  CC: Primary care physician; Derwood KaplanEason, Ernest B, MD  Note: This dictation was prepared with Dragon dictation along with smaller phrase technology. Any transcriptional errors that result from this process are unintentional.

## 2017-02-16 NOTE — Progress Notes (Addendum)
Inpatient Diabetes Program Recommendations  AACE/ADA: New Consensus Statement on Inpatient Glycemic Control (2015)  Target Ranges:  Prepandial:   less than 140 mg/dL      Peak postprandial:   less than 180 mg/dL (1-2 hours)      Critically ill patients:  140 - 180 mg/dL   Lab Results  Component Value Date   GLUCAP 173 (H) 02/16/2017   HGBA1C 9.8 (H) 02/09/2017    Review of Glycemic Control  Results for Jeremy Gutierrez, Jeremy Gutierrez (MRN [id number]) as of 02/16/2017 09:48  Ref. Range 02/15/2017 07:43 02/15/2017 11:37 02/15/2017 16:27 02/15/2017 20:52 02/16/2017 07:56  Glucose-Capillary Latest Ref Range: 65 - 99 mg/dL 119312 (H) 147286 (H) 829210 (H) 203 (H) 173 (H)   Diabetes history:DM Outpatient Diabetes medications:Novolog 75/25 mix 30 units ac breakfast + 25 units @ hs  Current orders for Inpatient glycemic control:Novolog 0-15 units tid, Novolog 0-5 units qhs, Novolog 3 units tid, Lantus 6 units qhs  Inpatient Diabetes Program Recommendations:Consider increasing mealtime insulin to 5  units tid  Spoke to patient by phone- identified by name and DOB.  Patient is able to tell me that he takes Novolog 75/25 30 units pre-breakfast and 25 units at hs.   Patient keeps insulin in the fridge and uses a vial and syringe- not an insulin pen.  Explained to patient that the evening insulin should be given before supper, not before hs because of the rapid 25% of the insulin in the mix. Patient verbalizes understanding but this will need to be written as pre-breakfast and pre- supper for his safety, when he goes home. For now, patient will be going to a rehab facility.    Susette RacerJulie Merton Wadlow, RN, BA, MHA, CDE Diabetes Coordinator Inpatient Diabetes Program  [phone] (Team Pager) [phone] [facility] Office) 02/16/2017 9:50 AM

## 2017-02-16 NOTE — Progress Notes (Signed)
Per Psych MD patient lacks capacity. Patient currently is under IVC and has a Comptrollersitter. Clinical Child psychotherapistocial Worker (CSW) contacted patient's daughter [place] and made her aware of above. [place] accepted bed offer from [facility]. Per [facility] admissions coordinator at [facility] patient will have to be off IVC and without a sitter for 24 hours prior to admission to [facility]. MD aware of above. CSW will continue to follow and assist as needed.   [facility] Hughes IncorporatedBailey Raziel Koenigs, LCSW [phone]

## 2017-02-16 NOTE — Progress Notes (Signed)
Pt unable to stay awake through a conversation. Pt has slept all day yesterday, all night last night, and all day so far. MD made aware, awaiting ABG's. Otilio JeffersonMadelyn S Fenton, RN

## 2017-02-16 NOTE — Consult Note (Signed)
[facility] Face-to-Face Psychiatry Consult   Reason for Consult: Consult 66 year old man being treated for sepsis.  Concerns have been raised about capacity Referring Physician:  Mody Patient Identification: Jeremy Gutierrez MRN:  [id number] Principal Diagnosis: <principal problem not specified> Diagnosis:   Patient Active Problem List   Diagnosis Date Noted  . Dementia [F03.90] 02/15/2017  . Pressure injury of skin [L89.90] 02/13/2017  . MRSA bacteremia [R78.81] 02/11/2017  . Sepsis ([place]) [A41.9] 02/09/2017  . Tobacco use disorder [F17.200] 01/23/2017  . Hyperglycemia [R73.9] 04/02/2016  . Essential hypertension, benign [I10] 02/29/2016  . AAA (abdominal aortic aneurysm) without rupture ([place]) [I71.4] 02/29/2016  . Diabetes ([place]) [E11.9] 02/29/2016  . Abscess of right thigh [L02.415] 10/29/2012    Total Time spent with patient: 30 minutes  Subjective:   Jeremy Gutierrez is a 66 y.o. male patient admitted with "I am okay".  HPI: See note from yesterday.  Follow-up for this 66 year old man in the hospital being treated for sepsis and cellulitis.  Concern was initially raised when the patient left the hospital [place] and then had to return almost immediately thereafter because of his obvious illness.  Patient has continued to appear to be grumpy, uninvolved, passive-aggressive frequently since coming back to the hospital but has not displayed any acutely dangerous behavior.  He was under IVC still as of today.  On interview the patient says he is feeling fine.  He continues to claim that he has no idea why he is in the hospital or what kind of treatment is being administered to him or what the plan is.  Appears to be uninterested in any kind of discussion of his health.  He absolutely however denies any suicidal ideation or intent to harm himself.  Past Psychiatric History: No past psychiatric history  Risk to Self: Is patient at risk for suicide?: No Risk to Others:   Prior  Inpatient Therapy:   Prior Outpatient Therapy:    Past Medical History:  Past Medical History:  Diagnosis Date  . Aneurysm ([place])   . Arthritis   . CHF (congestive heart failure) ([place])   . Diabetes mellitus without complication ([place])   . Heart murmur    as a child- 'nothing to worry about'  . Sleep apnea     Past Surgical History:  Procedure Laterality Date  . BUNIONECTOMY Right   . CARDIAC CATHETERIZATION  2010  . CATARACT EXTRACTION W/PHACO Right 06/05/2012   Procedure: CATARACT EXTRACTION PHACO AND INTRAOCULAR LENS PLACEMENT (IOC);  Surgeon: Adonis Brook, MD;  Location: [place];  Service: Ophthalmology;  Laterality: Right;  . Colonscopy  2012  . TEE WITHOUT CARDIOVERSION N/A 02/14/2017   Procedure: TRANSESOPHAGEAL ECHOCARDIOGRAM (TEE);  Surgeon: Teodoro Spray, MD;  Location: [facility];  Service: Cardiovascular;  Laterality: N/A;   Family History:  Family History  Problem Relation Age of Onset  . CAD Mother   . Cancer Sister    Family Psychiatric  History: None known Social History:  Social History   Substance and Sexual Activity  Alcohol Use Yes   Comment: social     Social History   Substance and Sexual Activity  Drug Use No    Social History   Socioeconomic History  . Marital status: Divorced    Spouse name: None  . Number of children: None  . Years of education: None  . Highest education level: None  Social Needs  . Financial resource strain: None  . Food insecurity - worry: None  .  Food insecurity - inability: None  . Transportation needs - medical: None  . Transportation needs - non-medical: None  Occupational History  . None  Tobacco Use  . Smoking status: Current Every Day Smoker    Packs/day: 0.50    Years: 40.00    Pack years: 20.00    Types: Cigarettes    Last attempt to quit: 06/02/2012    Years since quitting: 4.7  . Smokeless tobacco: Never Used  . Tobacco comment: smoked for many years.  Substance and Sexual Activity  . Alcohol use:  Yes    Comment: social  . Drug use: No  . Sexual activity: None  Other Topics Concern  . None  Social History Narrative  . None   Additional Social History:    Allergies:   Allergies  Allergen Reactions  . Sulfa Antibiotics Hives    Patient states he is not allergic to this medication    Labs:  Results for orders placed or performed during the hospital encounter of 02/11/17 (from the past 48 hour(s))  Glucose, capillary     Status: Abnormal   Collection Time: 02/14/17 10:06 PM  Result Value Ref Range   Glucose-Capillary 331 (H) 65 - 99 mg/dL   Comment 1 Notify RN   Vancomycin, trough     Status: Abnormal   Collection Time: 02/15/17  1:49 AM  Result Value Ref Range   Vancomycin Tr 30 (HH) 15 - 20 ug/mL    Comment: CRITICAL RESULT CALLED TO, READ BACK BY AND VERIFIED WITH MATTHEW MCBANE AT 0220 02/15/17 ALV Performed at [facility], [address] [place], [place] [address]   Basic metabolic panel     Status: Abnormal   Collection Time: 02/15/17  1:49 AM  Result Value Ref Range   Sodium 131 (L) 135 - 145 mmol/L   Potassium 4.6 3.5 - 5.1 mmol/L   Chloride 98 (L) 101 - 111 mmol/L   CO2 26 22 - 32 mmol/L   Glucose, Bld 359 (H) 65 - 99 mg/dL   BUN 48 (H) 6 - 20 mg/dL   Creatinine, Ser 1.59 (H) 0.61 - 1.24 mg/dL   Calcium 7.9 (L) 8.9 - 10.3 mg/dL   GFR calc non Af Amer 43 (L) >60 mL/min   GFR calc Af Amer 50 (L) >60 mL/min    Comment: (NOTE) The eGFR has been calculated using the CKD EPI equation. This calculation has not been validated in all clinical situations. eGFR's persistently <60 mL/min signify possible Chronic Kidney Disease.    Anion gap 7 5 - 15    Comment: Performed at [facility], [address] [place], [place] [address]  Glucose, capillary     Status: Abnormal   Collection Time: 02/15/17  7:43 AM  Result Value Ref Range   Glucose-Capillary 312 (H) 65 - 99 mg/dL  Glucose, capillary     Status: Abnormal   Collection Time:  02/15/17 11:37 AM  Result Value Ref Range   Glucose-Capillary 286 (H) 65 - 99 mg/dL  Glucose, capillary     Status: Abnormal   Collection Time: 02/15/17  4:27 PM  Result Value Ref Range   Glucose-Capillary 210 (H) 65 - 99 mg/dL   Comment 1 Notify RN   Vancomycin, trough     Status: Abnormal   Collection Time: 02/15/17  6:35 PM  Result Value Ref Range   Vancomycin Tr 14 (L) 15 - 20 ug/mL    Comment: Performed at [facility], [address] [place], [place] [address]  Glucose, capillary     Status: Abnormal   Collection Time: 02/15/17  8:52 PM  Result Value Ref Range   Glucose-Capillary 203 (H) 65 - 99 mg/dL   Comment 1 Notify RN    Comment 2 Document in Chart   Glucose, capillary     Status: Abnormal   Collection Time: 02/16/17  7:56 AM  Result Value Ref Range   Glucose-Capillary 173 (H) 65 - 99 mg/dL  Glucose, capillary     Status: Abnormal   Collection Time: 02/16/17 11:28 AM  Result Value Ref Range   Glucose-Capillary 209 (H) 65 - 99 mg/dL  Blood gas, arterial     Status: Abnormal (Preliminary result)   Collection Time: 02/16/17  1:00 PM  Result Value Ref Range   FIO2 0.21    pH, Arterial 7.50 (H) 7.350 - 7.450   pCO2 arterial 30 (L) 32.0 - 48.0 mmHg   pO2, Arterial 91 83.0 - 108.0 mmHg   Bicarbonate 23.4 20.0 - 28.0 mmol/L   Acid-Base Excess 1.3 0.0 - 2.0 mmol/L   O2 Saturation 97.7 %   Patient temperature 37.0    Collection site LEFT RADIAL    Sample type ARTERIAL DRAW    Allens test (pass/fail) POSITIVE (A) PASS    Comment: Performed at [facility], [address] [place], [place] [address]   Mechanical Rate PENDING     Current Facility-Administered Medications  Medication Dose Route Frequency Provider Last Rate Last Dose  . acetaminophen (TYLENOL) tablet 650 mg  650 mg Oral Q6H PRN Salary, Holly Bodily D, MD   650 mg at 02/13/17 2126   Or  . acetaminophen (TYLENOL) suppository 650 mg  650 mg Rectal Q6H PRN Salary, Montell D, MD      . aspirin  EC tablet 81 mg  81 mg Oral Daily Salary, Montell D, MD   81 mg at 02/16/17 1022  . carvedilol (COREG) tablet 3.125 mg  3.125 mg Oral BID WC Salary, Montell D, MD   3.125 mg at 02/15/17 1655  . enoxaparin (LOVENOX) injection 40 mg  40 mg Subcutaneous Q24H Hillary Bow, MD   40 mg at 02/16/17 1453  . HYDROcodone-acetaminophen (NORCO/VICODIN) 5-325 MG per tablet 1-2 tablet  1-2 tablet Oral Q4H PRN Salary, Montell D, MD   2 tablet at 02/12/17 2143  . hydrOXYzine (ATARAX/VISTARIL) tablet 25 mg  25 mg Oral TID PRN Salary, Montell D, MD      . insulin aspart (novoLOG) injection 0-15 Units  0-15 Units Subcutaneous TID WC Gorden Harms, MD   5 Units at 02/16/17 1219  . insulin aspart (novoLOG) injection 0-5 Units  0-5 Units Subcutaneous QHS Loney Hering D, MD   2 Units at 02/15/17 2225  . insulin aspart (novoLOG) injection 5 Units  5 Units Subcutaneous TID WC Bettey Costa, MD   5 Units at 02/16/17 1220  . insulin glargine (LANTUS) injection 6 Units  6 Units Subcutaneous Daily Hillary Bow, MD   6 Units at 02/16/17 1022  . montelukast (SINGULAIR) tablet 10 mg  10 mg Oral QHS Salary, Montell D, MD   10 mg at 02/15/17 2225  . multivitamin with minerals tablet 1 tablet  1 tablet Oral Daily Salary, Montell D, MD   1 tablet at 02/16/17 1022  . ondansetron (ZOFRAN) tablet 4 mg  4 mg Oral Q6H PRN Salary, Montell D, MD       Or  . ondansetron (ZOFRAN) injection 4 mg  4 mg Intravenous Q6H PRN Salary, Montell D, MD      . simvastatin (ZOCOR) tablet 20 mg  20 mg Oral QPM Salary, Montell D, MD   20 mg at 02/15/17 1731  . sodium chloride flush (NS) 0.9 % injection 10-40 mL  10-40 mL Intracatheter Q12H Sudini, Srikar, MD   10 mL at 02/16/17 1024  . sodium chloride flush (NS) 0.9 % injection 10-40 mL  10-40 mL Intracatheter PRN Sudini, Srikar, MD      . sodium chloride flush (NS) 0.9 % injection 3 mL  3 mL Intravenous Q12H Salary, Montell D, MD   3 mL at 02/15/17 2226  . tamsulosin (FLOMAX) capsule 0.4 mg  0.4  mg Oral Daily Salary, Montell D, MD   0.4 mg at 02/16/17 1022  . torsemide (DEMADEX) tablet 60 mg  60 mg Oral Daily Hillary Bow, MD   60 mg at 02/16/17 1022  . vancomycin (VANCOCIN) IVPB 1000 mg/200 mL premix  1,000 mg Intravenous Q18H Hillary Bow, MD   Stopped at 02/16/17 1650  . Vitamin D (Ergocalciferol) (DRISDOL) capsule 50,000 Units  50,000 Units Oral Q30 days Gorden Harms, MD   50,000 Units at 02/12/17 0905    Musculoskeletal: Strength & Muscle Tone: within normal limits Gait & Station: normal Patient leans: N/A  Psychiatric Specialty Exam: Physical Exam  Nursing note and vitals reviewed. Constitutional: He appears well-developed and well-nourished.  HENT:  Head: Normocephalic and atraumatic.  Eyes: Conjunctivae are normal. Pupils are equal, round, and reactive to light.  Neck: Normal range of motion.  Cardiovascular: Regular rhythm and normal heart sounds.  Respiratory: Effort normal. No respiratory distress.  GI: Soft.  Musculoskeletal: Normal range of motion.  Neurological: He is alert.  Skin: Skin is warm and dry.  Psychiatric: His affect is blunt. His speech is delayed. He is slowed. Cognition and memory are impaired. He expresses impulsivity. He expresses no homicidal and no suicidal ideation.    Review of Systems  Constitutional: Negative.   HENT: Negative.   Eyes: Negative.   Respiratory: Negative.   Cardiovascular: Negative.   Gastrointestinal: Negative.   Musculoskeletal: Negative.   Skin: Negative.   Neurological: Negative.   Psychiatric/Behavioral: Negative for depression, hallucinations, memory loss, substance abuse and suicidal ideas. The patient is not nervous/anxious and does not have insomnia.     Blood pressure 126/79, pulse 89, temperature 98 F (36.7 C), temperature source Oral, resp. rate 17, height 5' 7"  (1.702 m), weight 227 lb 9.6 oz (103.2 kg), SpO2 99 %.Body mass index is 35.65 kg/m.  General Appearance: Casual  Eye Contact:   Minimal  Speech:  Slow  Volume:  Decreased  Mood:  Irritable  Affect:  Constricted  Thought Process:  Disorganized  Orientation:  Negative  Thought Content:  Negative  Suicidal Thoughts:  No  Homicidal Thoughts:  No  Memory:  Immediate;   Fair Recent;   Poor Remote;   Poor  Judgement:  Poor  Insight:  Lacking  Psychomotor Activity:  Decreased  Concentration:  Concentration: Poor  Recall:  Poor  Fund of Knowledge:  Poor  Language:  Poor  Akathisia:  No  Handed:  Right  AIMS (if indicated):     Assets:  Resilience  ADL's:  Impaired  Cognition:  Impaired,  Moderate  Sleep:        Treatment Plan Summary: Plan 66 year old man who appears to be either unable or unwilling to engage in appropriate conversation about his own medical care.  I  had given him a diagnosis of dementia although to be fair it is difficult to know really what is going on when he is so uncooperative.  I maintain that he does not currently show capacity for making decisions for himself however I do not think he meets commitment criteria.  I have discontinued the IV C and I think we can safely discontinue the sitter so that he can be transferred to skilled nursing.  Case reviewed with nursing.  Disposition: No evidence of imminent risk to self or others at present.   Patient does not meet criteria for psychiatric inpatient admission. Supportive therapy provided about ongoing stressors.  Alethia Berthold, MD 02/16/2017 5:21 PM

## 2017-02-16 NOTE — Progress Notes (Signed)
Pharmacy Antibiotic Note  Jeremy Gutierrez is a 66 y.o. male re-admitted on 02/11/2017 with MRSA bacteremia.  Pharmacy has been consulted for vancomycin dosing.  Vancomycin Course: 12/21 Vancomycin 1250mg  IV q18h 12/23 0600 Vanc trough = 35 mcg/ml, however level drawn while drug infusing 12/23 2040 Vanc random = 918mcg/ml, Vancomycin 1250mg  IV x 1 given 12/24 Vancomycin 750mg  IV q12h 12/26 0130 Vanc trough = 1224mcg/ml, change to Vancomycin 750mg  IV q18h 12/27 0149 Vanc troug = 5430mcg/ml, however level drawn while drug infusing. 12/27 1835 vanc trough 14 mcg/mL. Increase to 1 gm IV Q18H, predicted trough 19 mcg/mL.  Plan: Will continue with Vancomycin 1g IV Q18H to target 15 to 20 mcg/mL (MRSA bacteremia). Pharmacy will continue to follow and adjust as needed to maintain trough 15 to 20 mcg/mL. Will need to keep a close check on SCr as patient is also on Torsemide. Will check at least every other day.  Height: 5\' 7"  (170.2 cm) Weight: 227 lb 9.6 oz (103.2 kg) IBW/kg (Calculated) : 66.1  Temp (24hrs), Avg:98 F (36.7 C), Min:97.7 F (36.5 C), Max:98.6 F (37 C)  Recent Labs  Lab 02/09/17 1207 02/09/17 1515  02/10/17 0659  02/11/17 0601 02/11/17 2041 02/12/17 0415 02/13/17 0424 02/14/17 0125 02/15/17 0149 02/15/17 1835  WBC 12.9*  --   --  13.1*  --  11.7* 13.7* 11.6*  --   --   --   --   CREATININE 1.33*  --    < > 1.51*  --  1.53* 1.58* 1.39* 1.53* 1.52* 1.59*  --   LATICACIDVEN 2.6* 2.1*  --   --   --   --   --   --   --   --   --   --   VANCOTROUGH  --   --   --   --    < > 35*  --   --   --  24* 30* 14*  VANCORANDOM  --   --   --   --   --   --  18  --   --   --   --   --    < > = values in this interval not displayed.    Estimated Creatinine Clearance: 50.9 mL/min (A) (by C-G formula based on SCr of 1.59 mg/dL (H)).    Allergies  Allergen Reactions  . Sulfa Antibiotics Hives    Patient states he is not allergic to this medication    Thank you for allowing pharmacy to  be a part of this patient's care.  Clovia CuffLisa Abegail Kloeppel, PharmD, BCPS 02/16/2017 9:24 AM

## 2017-02-17 LAB — CREATININE, SERUM: Creatinine, Ser: 1.15 mg/dL (ref 0.61–1.24)

## 2017-02-17 LAB — GLUCOSE, CAPILLARY
GLUCOSE-CAPILLARY: 219 mg/dL — AB (ref 65–99)
Glucose-Capillary: 192 mg/dL — ABNORMAL HIGH (ref 65–99)
Glucose-Capillary: 207 mg/dL — ABNORMAL HIGH (ref 65–99)
Glucose-Capillary: 224 mg/dL — ABNORMAL HIGH (ref 65–99)

## 2017-02-17 NOTE — Progress Notes (Signed)
Pharmacy Antibiotic Note  Jeremy Gutierrez is a 66 y.o. male re-admitted on 02/11/2017 with MRSA bacteremia.  Pharmacy has been consulted for vancomycin dosing.  Vancomycin Course: 12/21 Vancomycin 1250mg  IV q18h 12/23 0600 Vanc trough = 35 mcg/ml, however level drawn while drug infusing 12/23 2040 Vanc random = 5118mcg/ml, Vancomycin 1250mg  IV x 1 given 12/24 Vancomycin 750mg  IV q12h 12/26 0130 Vanc trough = 6924mcg/ml, change to Vancomycin 750mg  IV q18h 12/27 0149 Vanc troug = 6530mcg/ml, however level drawn while drug infusing. 12/27 1835 vanc trough 14 mcg/mL. Increase to 1 gm IV Q18H, predicted trough 19 mcg/mL.  Plan: Will continue with Vancomycin 1g IV Q18H to target 15 to 20 mcg/mL (MRSA bacteremia). Pharmacy will continue to follow and adjust as needed to maintain trough 15 to 20 mcg/mL. Will need to keep a close check on SCr as patient is also on Torsemide. Will check at least every other day.  Renal function is improving. Scr down to 1.15 this AM from 1.59 on 12/27. Dose was increased to 1g q 18hr on 12/27. Trough is due @ 0130 tomorrow AM. Will check trough and adjust as needed.  Height: 5\' 7"  (170.2 cm) Weight: 216 lb 3.2 oz (98.1 kg) IBW/kg (Calculated) : 66.1  Temp (24hrs), Avg:98.2 F (36.8 C), Min:98.1 F (36.7 C), Max:98.3 F (36.8 C)  Recent Labs  Lab 02/11/17 0601 02/11/17 2041 02/12/17 0415 02/13/17 0424 02/14/17 0125 02/15/17 0149 02/15/17 1835 02/17/17 0414  WBC 11.7* 13.7* 11.6*  --   --   --   --   --   CREATININE 1.53* 1.58* 1.39* 1.53* 1.52* 1.59*  --  1.15  VANCOTROUGH 35*  --   --   --  24* 30* 14*  --   VANCORANDOM  --  18  --   --   --   --   --   --     Estimated Creatinine Clearance: 68.6 mL/min (by C-G formula based on SCr of 1.15 mg/dL).    Allergies  Allergen Reactions  . Sulfa Antibiotics Hives    Patient states he is not allergic to this medication    Thank you for allowing pharmacy to be a part of this patient's care.  Olene FlossMelissa D  Maccia, Pharm.D, BCPS Clinical Pharmacist  02/17/2017 12:37 PM

## 2017-02-17 NOTE — Plan of Care (Signed)
  Elimination: Will not experience complications related to urinary retention 02/17/2017 1349 - Progressing by Garwin Brothershomas, Letonya Mangels Lynn, RN  Pt continues to be incontinent of urine; refusing to call for assistance or use the urinal at the bedside

## 2017-02-17 NOTE — Plan of Care (Signed)
  Safety: Ability to remain free from injury will improve 02/17/2017 1351 - Progressing by Garwin Brothershomas, Toshi Ishii Lynn, RN 02/17/2017 1349 - Progressing by Garwin Brothershomas, Zandrea Kenealy Lynn, RN  Pt remains on High Fall Precautions

## 2017-02-17 NOTE — Progress Notes (Signed)
[facility] [place] [facility]   PATIENT NAME: Jeremy LofflerBarry Gutierrez    MR#:  [id number]  DATE OF BIRTH:  10/23/1950  SUBJECTIVE:  No acute  events overnight Sitter d/c and IVC by dr clapacs yesterday  REVIEW OF SYSTEMS:    Review of Systems  Constitutional: Negative for fever, chills weight loss HENT: Negative for ear pain, nosebleeds, congestion, facial swelling, rhinorrhea, neck pain, neck stiffness and ear discharge.   Respiratory: Negative for cough, shortness of breath, wheezing  Cardiovascular: Negative for chest pain, palpitations and leg swelling.  Gastrointestinal: Negative for heartburn, abdominal pain, vomiting, diarrhea or consitpation Genitourinary: Negative for dysuria, urgency, frequency, hematuria Musculoskeletal: Negative for back pain or joint pain Neurological: Negative for dizziness, seizures, syncope, focal weakness,  numbness and headaches.  Hematological: Does not bruise/bleed easily.  Psychiatric/Behavioral: Negative for hallucinations, confusion, dysphoric mood    Tolerating Diet: yes      DRUG ALLERGIES:   Allergies  Allergen Reactions  . Sulfa Antibiotics Hives    Patient states he is not allergic to this medication    VITALS:  Blood pressure 118/75, pulse 86, temperature 98.1 F (36.7 C), temperature source Oral, resp. rate 20, height 5\' 7"  (1.702 m), weight 98.1 kg (216 lb 3.2 oz), SpO2 100 %.  PHYSICAL EXAMINATION:  Constitutional: Appears well-developed and well-nourished. No distress. HENT: Normocephalic. Marland Kitchen. Oropharynx is clear and moist.  Eyes: Conjunctivae and EOM are normal. PERRLA, no scleral icterus.  Neck: Normal ROM. Neck supple. No JVD. No tracheal deviation. CVS: RRR, S1/S2 +, no murmurs, no gallops, no carotid bruit.  Pulmonary: Effort and breath sounds normal, no stridor, rhonchi, wheezes, rales.  Abdominal: Soft. BS +,  no distension, tenderness, rebound or guarding.  Musculoskeletal: Normal range of motion.  No edema and no tenderness.  Neuro: Alert. CN 2-12 grossly intact. No focal deficits. Skin: Skin is warm and dry. No rash noted. Psychiatric: Normal mood and affect.      LABORATORY PANEL:   CBC Recent Labs  Lab 02/12/17 0415  WBC 11.6*  HGB 16.0  HCT 49.0  PLT 116*   ------------------------------------------------------------------------------------------------------------------  Chemistries  Recent Labs  Lab 02/15/17 0149 02/17/17 0414  NA 131*  --   K 4.6  --   CL 98*  --   CO2 26  --   GLUCOSE 359*  --   BUN 48*  --   CREATININE 1.59* 1.15  CALCIUM 7.9*  --    ------------------------------------------------------------------------------------------------------------------  Cardiac Enzymes No results for input(s): TROPONINI in the last 168 hours. ------------------------------------------------------------------------------------------------------------------  RADIOLOGY:  No results found.   ASSESSMENT AND PLAN:    66 year old male with chronic diastolic heart failure, diabetes who presented after leaving AMA due to inability to take care of himself.   1. 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  2. Acute metabolic encephalopathy due to above and underlying dementia  3. Uncontrolled diabetes: Continue Lantus and NovoLog Diabetes nurse consult appreciated  4. Chronic diastolic heart failure with preserved ejection fraction: Continue torsemide  5. Chronic kidney disease stage III: Creatinine stable  6. Thrombocytopenia, mild E to bacteremia  7. Depression: Psychiatry is following He no longer has sitter or IVC since 02/16/2017 as per Dr Toni Amendlapacs.   No evidence of imminent risk to self or others at present  Psychiatry is seeing the  patient lacks capacity to make reasonable decisions. Family is looking into pursuing guardianship.   8. Essential hypertension: Continue Coreg 9 BPH: Continue Flomax  Management plans discussed with the patient and he is in agreement.  CODE STATUS: FULL  TOTAL TIME TAKING CARE OF THIS PATIENT: 21 minutes.     POSSIBLE D/C tomorrow to SNF needs 24 hours no sitter, DEPENDING ON CLINICAL CONDITION.   Titan Karner M.D on 02/17/2017 at 11:08 AM  Between 7am to 6pm - Pager - [phone] After 6pm go to [email or web address] - password [email or web address]  [facility] [place] [facility]  Office  [phone]  CC: Primary care physician; Derwood KaplanEason, Ernest B, MD  Note: This dictation was prepared with Dragon dictation along with smaller phrase technology. Any transcriptional errors that result from this process are unintentional.

## 2017-02-18 DIAGNOSIS — Z7401 Bed confinement status: Secondary | ICD-10-CM | POA: Diagnosis not present

## 2017-02-18 DIAGNOSIS — R4182 Altered mental status, unspecified: Secondary | ICD-10-CM | POA: Diagnosis not present

## 2017-02-18 DIAGNOSIS — R7881 Bacteremia: Secondary | ICD-10-CM | POA: Diagnosis not present

## 2017-02-18 DIAGNOSIS — D631 Anemia in chronic kidney disease: Secondary | ICD-10-CM | POA: Diagnosis not present

## 2017-02-18 DIAGNOSIS — F339 Major depressive disorder, recurrent, unspecified: Secondary | ICD-10-CM | POA: Diagnosis not present

## 2017-02-18 DIAGNOSIS — I330 Acute and subacute infective endocarditis: Secondary | ICD-10-CM | POA: Diagnosis not present

## 2017-02-18 DIAGNOSIS — I110 Hypertensive heart disease with heart failure: Secondary | ICD-10-CM | POA: Diagnosis not present

## 2017-02-18 DIAGNOSIS — R262 Difficulty in walking, not elsewhere classified: Secondary | ICD-10-CM | POA: Diagnosis not present

## 2017-02-18 DIAGNOSIS — L03119 Cellulitis of unspecified part of limb: Secondary | ICD-10-CM | POA: Diagnosis not present

## 2017-02-18 DIAGNOSIS — R609 Edema, unspecified: Secondary | ICD-10-CM | POA: Diagnosis not present

## 2017-02-18 DIAGNOSIS — B9561 Methicillin susceptible Staphylococcus aureus infection as the cause of diseases classified elsewhere: Secondary | ICD-10-CM | POA: Diagnosis not present

## 2017-02-18 DIAGNOSIS — L0390 Cellulitis, unspecified: Secondary | ICD-10-CM | POA: Diagnosis not present

## 2017-02-18 DIAGNOSIS — R5381 Other malaise: Secondary | ICD-10-CM | POA: Diagnosis not present

## 2017-02-18 DIAGNOSIS — I509 Heart failure, unspecified: Secondary | ICD-10-CM | POA: Diagnosis not present

## 2017-02-18 DIAGNOSIS — I5032 Chronic diastolic (congestive) heart failure: Secondary | ICD-10-CM | POA: Diagnosis not present

## 2017-02-18 DIAGNOSIS — N183 Chronic kidney disease, stage 3 (moderate): Secondary | ICD-10-CM | POA: Diagnosis not present

## 2017-02-18 DIAGNOSIS — E162 Hypoglycemia, unspecified: Secondary | ICD-10-CM | POA: Diagnosis not present

## 2017-02-18 DIAGNOSIS — F0390 Unspecified dementia without behavioral disturbance: Secondary | ICD-10-CM | POA: Diagnosis not present

## 2017-02-18 DIAGNOSIS — G4730 Sleep apnea, unspecified: Secondary | ICD-10-CM | POA: Diagnosis not present

## 2017-02-18 DIAGNOSIS — M129 Arthropathy, unspecified: Secondary | ICD-10-CM | POA: Diagnosis not present

## 2017-02-18 DIAGNOSIS — E119 Type 2 diabetes mellitus without complications: Secondary | ICD-10-CM | POA: Diagnosis not present

## 2017-02-18 DIAGNOSIS — G9341 Metabolic encephalopathy: Secondary | ICD-10-CM | POA: Diagnosis not present

## 2017-02-18 DIAGNOSIS — I10 Essential (primary) hypertension: Secondary | ICD-10-CM | POA: Diagnosis not present

## 2017-02-18 DIAGNOSIS — A419 Sepsis, unspecified organism: Secondary | ICD-10-CM | POA: Diagnosis not present

## 2017-02-18 DIAGNOSIS — M6281 Muscle weakness (generalized): Secondary | ICD-10-CM | POA: Diagnosis not present

## 2017-02-18 LAB — CBC
HEMATOCRIT: 43.3 % (ref 40.0–52.0)
Hemoglobin: 14.4 g/dL (ref 13.0–18.0)
MCH: 32.7 pg (ref 26.0–34.0)
MCHC: 33.3 g/dL (ref 32.0–36.0)
MCV: 98.2 fL (ref 80.0–100.0)
PLATELETS: 234 10*3/uL (ref 150–440)
RBC: 4.41 MIL/uL (ref 4.40–5.90)
RDW: 15.8 % — AB (ref 11.5–14.5)
WBC: 11.1 10*3/uL — AB (ref 3.8–10.6)

## 2017-02-18 LAB — GLUCOSE, CAPILLARY
Glucose-Capillary: 235 mg/dL — ABNORMAL HIGH (ref 65–99)
Glucose-Capillary: 231 mg/dL — ABNORMAL HIGH (ref 65–99)

## 2017-02-18 LAB — VANCOMYCIN, TROUGH: VANCOMYCIN TR: 19 ug/mL (ref 15–20)

## 2017-02-18 MED ORDER — TORSEMIDE 20 MG PO TABS: 60.0000 mg | ORAL_TABLET | Freq: Every day | ORAL | Status: AC

## 2017-02-18 MED ORDER — VANCOMYCIN HCL IN DEXTROSE 1-5 GM/200ML-% IV SOLN: 1000.0000 mg | INTRAVENOUS | 0 refills | Status: DC

## 2017-02-18 MED ORDER — INSULIN LISPRO PROT & LISPRO (75-25 MIX) 100 UNIT/ML ~~LOC~~ SUSP: 15.0000 [IU] | Freq: Two times a day (BID) | SUBCUTANEOUS | 0 refills | Status: DC

## 2017-02-18 NOTE — Discharge Summary (Signed)
[employer] - [place] at [facility]   PATIENT NAME: Jeremy Gutierrez    MR#:  [id number]  DATE OF BIRTH:  09/26/1950  DATE OF ADMISSION:  02/11/2017 ADMITTING PHYSICIAN: Bertrum SolMontell D Salary, MD  DATE OF DISCHARGE: 02/18/2017  PRIMARY CARE PHYSICIAN: Derwood KaplanEason, Ernest B, MD    ADMISSION DIAGNOSIS:  Peripheral edema [R60.9] MRSA bacteremia [R78.81]  DISCHARGE DIAGNOSIS:  Active Problems:   MSSA bacteremia   Pressure injury of skin   Dementia   SECONDARY DIAGNOSIS:   Past Medical History:  Diagnosis Date  . Aneurysm (HCC)   . Arthritis   . CHF (congestive heart failure) (HCC)   . Diabetes mellitus without complication (HCC)   . Heart murmur    as a child- 'nothing to worry about'  . Sleep apnea     HOSPITAL COURSE:   66 year old male with chronic diastolic heart failure, diabetes who presented after leaving AMA due to inability to take care of himself.   1. 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  2. Acute metabolic encephalopathy due to above and underlying dementia  3. Uncontrolled diabetes: ContinueADA diet with NPH 70/30. Follow blood sugars QAC and QHS at SNF.   4. Chronic diastolic heart failure with preserved ejection fraction: Continue torsemide  5. Chronic kidney disease stage III: Creatinine stable  6. Thrombocytopenia, stable 7. Depression: Psychiatry saw patient while in the hospital. He no longer has sitter or IVC since 02/16/2017 as per Dr Toni Amendlapacs.   No evidence of imminent risk to self or others at present  Psychiatry is seeing the patient lacks capacity to make reasonable decisions. Family is looking into pursuing guardianship.   8. Essential hypertension: Continue Coreg 9 BPH: Continue Flomax  UNNA Boots  change 2x/week   DISCHARGE CONDITIONS AND DIET:   Stable Diabetic diet  CONSULTS OBTAINED:  Treatment Team:  Mick SellFitzgerald, David P, MD Clapacs, Jackquline DenmarkJohn T, MD  DRUG ALLERGIES:   Allergies  Allergen Reactions  . Sulfa Antibiotics Hives    Patient states he is not allergic to this medication    DISCHARGE MEDICATIONS:   Allergies as of 02/18/2017      Reactions   Sulfa Antibiotics Hives   Patient states he is not allergic to this medication      Medication List    STOP taking these medications   ENTRESTO 24-26 MG Generic drug:  sacubitril-valsartan     TAKE these medications   aspirin 81 MG tablet Take 81 mg by mouth daily.   carvedilol 3.125 MG tablet Commonly known as:  COREG Take 3.125 mg by mouth 2 (two) times daily with a meal.   hydrOXYzine 25 MG tablet Commonly known as:  ATARAX/VISTARIL TAKE 1 TO 2 TABLETS BY MOUTH AT BEDTIME IF NEEDED FOR ITCHING   insulin lispro protamine-lispro (75-25) 100 UNIT/ML Susp injection Commonly known as:  HUMALOG 75/25 MIX Inject 15 Units into the skin 2 (two) times daily with a meal. What changed:    how much to take  additional instructions   montelukast 10 MG tablet Commonly known as:  SINGULAIR TK 1 T PO  D   ONE TOUCH ULTRA TEST test strip Generic drug:  glucose blood   simvastatin 20 MG tablet Commonly known as:  ZOCOR Take 20 mg by mouth every evening.   tamsulosin 0.4 MG Caps capsule  Commonly known as:  FLOMAX Take 0.4 mg by mouth daily.   torsemide 20 MG tablet Commonly known as:  DEMADEX Take 3 tablets (60 mg total) by mouth daily. Start taking on:  02/19/2017 What changed:    medication strength  how much to take   vancomycin 1-5 GM/200ML-% Soln Commonly known as:  VANCOCIN Inject 200 mLs (1,000 mg total) into the vein every 18 (eighteen) hours.   Vitamin D (Ergocalciferol) 50000 units Caps capsule Commonly known as:  DRISDOL Take 1 capsule by mouth every 30 (thirty) days.             Discharge Care Instructions  (From admission, onward)        Start     Ordered   02/18/17 0000  Discharge wound care:    Comments:  Change unna boots 2x week   02/18/17 0955        Today   CHIEF COMPLAINT:   No acute events overnight   VITAL SIGNS:  Blood pressure 123/79, pulse 81, temperature 97.9 F (36.6 C), temperature source Oral, resp. rate 20, height 5\' 7"  (1.702 m), weight 100.4 kg (221 lb 4.8 oz), SpO2 95 %.   REVIEW OF SYSTEMS:  Review of Systems  Constitutional: Negative.  Negative for chills, fever and malaise/fatigue.  HENT: Negative.  Negative for ear discharge, ear pain, hearing loss, nosebleeds and sore throat.   Eyes: Negative.  Negative for blurred vision and pain.  Respiratory: Negative.  Negative for cough, hemoptysis, shortness of breath and wheezing.   Cardiovascular: Negative.  Negative for chest pain, palpitations and leg swelling.  Gastrointestinal: Negative.  Negative for abdominal pain, blood in stool, diarrhea, nausea and vomiting.  Genitourinary: Negative.  Negative for dysuria.  Musculoskeletal: Negative.  Negative for back pain.  Skin: Negative.   Neurological: Negative for dizziness, tremors, speech change, focal weakness, seizures and headaches.  Endo/Heme/Allergies: Negative.  Does not bruise/bleed easily.  Psychiatric/Behavioral: Positive for depression. Negative for hallucinations and suicidal ideas.     PHYSICAL EXAMINATION:  GENERAL:  66 y.o.-year-old patient lying in the bed with no acute distress.  NECK:  Supple, no jugular venous distention. No thyroid enlargement, no tenderness.  LUNGS: Normal breath sounds bilaterally, no wheezing, rales,rhonchi  No use of accessory muscles of respiration.  CARDIOVASCULAR: S1, S2 normal. No murmurs, rubs, or gallops.  ABDOMEN: Soft, non-tender, non-distended. Bowel sounds present. No organomegaly or mass.  EXTREMITIES: No pedal edema, cyanosis, or clubbing.  PSYCHIATRIC: The patient is  alert and oriented x 3.  SKIN: No obvious rash, lesion, or ulcer.   DATA REVIEW:   CBC Recent Labs  Lab 02/18/17 0114  WBC 11.1*  HGB 14.4  HCT 43.3  PLT 234    Chemistries  Recent Labs  Lab 02/15/17 0149 02/17/17 0414  NA 131*  --   K 4.6  --   CL 98*  --   CO2 26  --   GLUCOSE 359*  --   BUN 48*  --   CREATININE 1.59* 1.15  CALCIUM 7.9*  --     Cardiac Enzymes No results for input(s): TROPONINI in the last 168 hours.  Microbiology Results  @MICRORSLT48 @  RADIOLOGY:  No results found.    Allergies as of 02/18/2017      Reactions   Sulfa Antibiotics Hives   Patient states he is not allergic to this medication      Medication List    STOP taking these medications   ENTRESTO 24-26 MG Generic drug:  sacubitril-valsartan     TAKE these medications   aspirin 81 MG tablet Take 81 mg by mouth daily.   carvedilol 3.125 MG tablet Commonly known as:  COREG Take 3.125 mg by mouth 2 (two) times daily with a meal.   hydrOXYzine 25 MG tablet Commonly known as:  ATARAX/VISTARIL TAKE 1 TO 2 TABLETS BY MOUTH AT BEDTIME IF NEEDED FOR ITCHING   insulin lispro protamine-lispro (75-25) 100 UNIT/ML Susp injection Commonly known as:  HUMALOG 75/25 MIX Inject 15 Units into the skin 2 (two) times daily with a meal. What changed:    how much to take  additional instructions   montelukast 10 MG tablet Commonly known as:  SINGULAIR TK 1 T PO  D   ONE TOUCH ULTRA TEST test strip Generic drug:  glucose blood   simvastatin 20 MG tablet Commonly known as:  ZOCOR Take 20 mg by mouth every evening.   tamsulosin 0.4 MG Caps capsule Commonly known as:  FLOMAX Take 0.4 mg by mouth daily.   torsemide 20 MG tablet Commonly known as:  DEMADEX Take 3 tablets (60 mg total) by mouth daily. Start taking on:  02/19/2017 What changed:    medication strength  how much to take   vancomycin 1-5 GM/200ML-% Soln Commonly known as:  VANCOCIN Inject 200 mLs (1,000 mg  total) into the vein every 18 (eighteen) hours.   Vitamin D (Ergocalciferol) 50000 units Caps capsule Commonly known as:  DRISDOL Take 1 capsule by mouth every 30 (thirty) days.            Discharge Care Instructions  (From admission, onward)        Start     Ordered   02/18/17 0000  Discharge wound care:    Comments:  Change unna boots 2x week   02/18/17 [id number]         Management plans discussed with the patient and he is in agreement. Stable for discharge   Patient should follow up with pcp   CODE STATUS:     Code Status Orders  (From admission, onward)        Start     Ordered   02/12/17 0104  Full code  Continuous     02/12/17 0103    Code Status History    Date Active Date Inactive Code Status Order ID Comments User Context   02/09/2017 16:28 02/11/2017 12:29 Full Code [id number]  Shaune Pollackhen, Qing, MD Inpatient   04/02/2016 12:41 04/04/2016 22:49 Full Code [id number]  Arnaldo Nataliamond, Michael S, MD ED      TOTAL TIME TAKING CARE OF THIS PATIENT: 38 minutes.    Note: This dictation was prepared with Dragon dictation along with smaller phrase technology. Any transcriptional errors that result from this process are unintentional.  Vaunda Gutterman M.D on 02/18/2017 at 9:56 AM  Between 7am to 6pm - Pager - [phone] After 6pm go to [email or web address] - Social research officer, governmentpassword [email or web address]  [facility] [place] [facility]  Office  [phone]  CC: Primary care physician; Derwood KaplanEason, Ernest B, MD

## 2017-02-18 NOTE — Progress Notes (Signed)
MD order received to discharge pt to SNF/[facility] today; Care Management previously prepared discharge packet for EMS personnel to take to the facility; [facility] [phone] [facility], no questions voiced at this time; 911 [facility]; discharge pending arrival of EMS personnel

## 2017-02-18 NOTE — Progress Notes (Signed)
[facility]; telephone call to pt's daughter, Samuel JesterBrittany Smalls, [phone] advising her of the doctor's order and that the pt would be discharged after lunch today

## 2017-02-18 NOTE — Progress Notes (Signed)
Pharmacy Antibiotic Note  Jeremy Gutierrez is a 66 y.o. male re-admitted on 02/11/2017 with MRSA bacteremia.  Pharmacy has been consulted for vancomycin dosing.  Vancomycin Course: 12/21 Vancomycin 1250mg  IV q18h 12/23 0600 Vanc trough = 35 mcg/ml, however level drawn while drug infusing 12/23 2040 Vanc random = 3018mcg/ml, Vancomycin 1250mg  IV x 1 given 12/24 Vancomycin 750mg  IV q12h 12/26 0130 Vanc trough = 6324mcg/ml, change to Vancomycin 750mg  IV q18h 12/27 0149 Vanc troug = 4230mcg/ml, however level drawn while drug infusing. 12/27 1835 vanc trough 14 mcg/mL. Increase to 1 gm IV Q18H, predicted trough 19 mcg/mL.  Plan: Will continue with Vancomycin 1g IV Q18H to target 15 to 20 mcg/mL (MRSA bacteremia). Pharmacy will continue to follow and adjust as needed to maintain trough 15 to 20 mcg/mL. Will need to keep a close check on SCr as patient is also on Torsemide. Will check at least every other day.  Renal function is improving. Scr down to 1.15 this AM from 1.59 on 12/27. Dose was increased to 1g q 18hr on 12/27. Trough is due @ 0130 tomorrow AM. Will check trough and adjust as needed.  12/30 0130 vanc level 19. Continue current regimen.  Height: 5\' 7"  (170.2 cm) Weight: 216 lb 3.2 oz (98.1 kg) IBW/kg (Calculated) : 66.1  Temp (24hrs), Avg:97.8 F (36.6 C), Min:97.4 F (36.3 C), Max:98.1 F (36.7 C)  Recent Labs  Lab 02/11/17 0601 02/11/17 2041 02/12/17 0415 02/13/17 0424 02/14/17 0125 02/15/17 0149 02/15/17 1835 02/17/17 0414 02/18/17 0114  WBC 11.7* 13.7* 11.6*  --   --   --   --   --  11.1*  CREATININE 1.53* 1.58* 1.39* 1.53* 1.52* 1.59*  --  1.15  --   VANCOTROUGH 35*  --   --   --  24* 30* 14*  --  19  VANCORANDOM  --  18  --   --   --   --   --   --   --     Estimated Creatinine Clearance: 68.6 mL/min (by C-G formula based on SCr of 1.15 mg/dL).    Allergies  Allergen Reactions  . Sulfa Antibiotics Hives    Patient states he is not allergic to this medication     Thank you for allowing pharmacy to be a part of this patient's care.  Olene FlossMelissa D Maccia, Pharm.D, BCPS Clinical Pharmacist  02/18/2017 2:10 AM

## 2017-02-18 NOTE — Discharge Instructions (Signed)
Last dose of vanc was given at 0141 on 02/18/17 Vancomycin 1g q 18 hr Last trough resulted @ 19 just prior to last dose  Labs weekly while on IV antibiotics: _x_ CBC with differential _x_ BMP __ CMP __ CRP __ ESR _x_ Vancomycin trough  _x_ Please pull PIC at completion of IV antibiotics __ Please leave PIC in place until doctor has seen patient or been notified

## 2017-02-18 NOTE — Progress Notes (Signed)
EMS present for pt discharge; discharge packet given to EMS personnel; [facility]; telephone call to pt's daughter, Lowanda FosterBrittany [phone] advised her that her [facility] was [facility] the [facility].

## 2017-02-19 DIAGNOSIS — I509 Heart failure, unspecified: Secondary | ICD-10-CM | POA: Diagnosis not present

## 2017-02-19 DIAGNOSIS — I110 Hypertensive heart disease with heart failure: Secondary | ICD-10-CM | POA: Diagnosis not present

## 2017-02-19 DIAGNOSIS — R5381 Other malaise: Secondary | ICD-10-CM | POA: Diagnosis not present

## 2017-02-19 DIAGNOSIS — R7881 Bacteremia: Secondary | ICD-10-CM | POA: Diagnosis not present

## 2017-02-28 ENCOUNTER — Other Ambulatory Visit (INDEPENDENT_AMBULATORY_CARE_PROVIDER_SITE_OTHER): Payer: Medicare Other

## 2017-02-28 ENCOUNTER — Ambulatory Visit (INDEPENDENT_AMBULATORY_CARE_PROVIDER_SITE_OTHER): Payer: Medicare Other | Admitting: Vascular Surgery

## 2017-03-03 LAB — BLOOD GAS, ARTERIAL
ACID-BASE EXCESS: 1.3 mmol/L (ref 0.0–2.0)
Allens test (pass/fail): POSITIVE — AB
BICARBONATE: 23.4 mmol/L (ref 20.0–28.0)
FIO2: 0.21
O2 SAT: 97.7 %
PATIENT TEMPERATURE: 37
PO2 ART: 91 mmHg (ref 83.0–108.0)
pCO2 arterial: 30 mmHg — ABNORMAL LOW (ref 32.0–48.0)
pH, Arterial: 7.5 — ABNORMAL HIGH (ref 7.350–7.450)

## 2017-03-06 ENCOUNTER — Encounter: Payer: Self-pay | Admitting: Registered Nurse

## 2017-03-15 ENCOUNTER — Encounter: Payer: Self-pay | Admitting: Emergency Medicine

## 2017-03-15 ENCOUNTER — Inpatient Hospital Stay: Payer: Medicare Other

## 2017-03-15 ENCOUNTER — Emergency Department: Payer: Medicare Other

## 2017-03-15 ENCOUNTER — Inpatient Hospital Stay
Admission: EM | Admit: 2017-03-15 | Discharge: 2017-03-23 | DRG: 853 | Disposition: A | Discharge status: Hospice | Payer: Medicare Other | Attending: Internal Medicine | Admitting: Internal Medicine

## 2017-03-15 ENCOUNTER — Other Ambulatory Visit: Payer: Self-pay

## 2017-03-15 DIAGNOSIS — Z79899 Other long term (current) drug therapy: Secondary | ICD-10-CM

## 2017-03-15 DIAGNOSIS — Z7189 Other specified counseling: Secondary | ICD-10-CM

## 2017-03-15 DIAGNOSIS — N170 Acute kidney failure with tubular necrosis: Secondary | ICD-10-CM | POA: Diagnosis present

## 2017-03-15 DIAGNOSIS — E1151 Type 2 diabetes mellitus with diabetic peripheral angiopathy without gangrene: Secondary | ICD-10-CM | POA: Diagnosis present

## 2017-03-15 DIAGNOSIS — I339 Acute and subacute endocarditis, unspecified: Secondary | ICD-10-CM | POA: Diagnosis present

## 2017-03-15 DIAGNOSIS — F0390 Unspecified dementia without behavioral disturbance: Secondary | ICD-10-CM | POA: Diagnosis present

## 2017-03-15 DIAGNOSIS — N184 Chronic kidney disease, stage 4 (severe): Secondary | ICD-10-CM | POA: Diagnosis present

## 2017-03-15 DIAGNOSIS — L89152 Pressure ulcer of sacral region, stage 2: Secondary | ICD-10-CM | POA: Diagnosis present

## 2017-03-15 DIAGNOSIS — Z882 Allergy status to sulfonamides status: Secondary | ICD-10-CM

## 2017-03-15 DIAGNOSIS — I429 Cardiomyopathy, unspecified: Secondary | ICD-10-CM | POA: Diagnosis present

## 2017-03-15 DIAGNOSIS — L0390 Cellulitis, unspecified: Secondary | ICD-10-CM

## 2017-03-15 DIAGNOSIS — R7989 Other specified abnormal findings of blood chemistry: Secondary | ICD-10-CM

## 2017-03-15 DIAGNOSIS — E1121 Type 2 diabetes mellitus with diabetic nephropathy: Secondary | ICD-10-CM | POA: Diagnosis present

## 2017-03-15 DIAGNOSIS — Z66 Do not resuscitate: Secondary | ICD-10-CM

## 2017-03-15 DIAGNOSIS — L97929 Non-pressure chronic ulcer of unspecified part of left lower leg with unspecified severity: Secondary | ICD-10-CM | POA: Diagnosis present

## 2017-03-15 DIAGNOSIS — R509 Fever, unspecified: Secondary | ICD-10-CM | POA: Diagnosis present

## 2017-03-15 DIAGNOSIS — A419 Sepsis, unspecified organism: Secondary | ICD-10-CM | POA: Diagnosis present

## 2017-03-15 DIAGNOSIS — B961 Klebsiella pneumoniae [K. pneumoniae] as the cause of diseases classified elsewhere: Secondary | ICD-10-CM | POA: Diagnosis present

## 2017-03-15 DIAGNOSIS — N289 Disorder of kidney and ureter, unspecified: Secondary | ICD-10-CM

## 2017-03-15 DIAGNOSIS — I872 Venous insufficiency (chronic) (peripheral): Secondary | ICD-10-CM | POA: Diagnosis present

## 2017-03-15 DIAGNOSIS — I2510 Atherosclerotic heart disease of native coronary artery without angina pectoris: Secondary | ICD-10-CM | POA: Diagnosis present

## 2017-03-15 DIAGNOSIS — M1990 Unspecified osteoarthritis, unspecified site: Secondary | ICD-10-CM | POA: Diagnosis present

## 2017-03-15 DIAGNOSIS — N400 Enlarged prostate without lower urinary tract symptoms: Secondary | ICD-10-CM | POA: Diagnosis present

## 2017-03-15 DIAGNOSIS — E1122 Type 2 diabetes mellitus with diabetic chronic kidney disease: Secondary | ICD-10-CM | POA: Diagnosis present

## 2017-03-15 DIAGNOSIS — Z87441 Personal history of nephrotic syndrome: Secondary | ICD-10-CM

## 2017-03-15 DIAGNOSIS — Z8619 Personal history of other infectious and parasitic diseases: Secondary | ICD-10-CM

## 2017-03-15 DIAGNOSIS — R600 Localized edema: Secondary | ICD-10-CM

## 2017-03-15 DIAGNOSIS — I714 Abdominal aortic aneurysm, without rupture: Secondary | ICD-10-CM

## 2017-03-15 DIAGNOSIS — I70202 Unspecified atherosclerosis of native arteries of extremities, left leg: Secondary | ICD-10-CM | POA: Diagnosis present

## 2017-03-15 DIAGNOSIS — N390 Urinary tract infection, site not specified: Secondary | ICD-10-CM | POA: Diagnosis present

## 2017-03-15 DIAGNOSIS — E871 Hypo-osmolality and hyponatremia: Secondary | ICD-10-CM | POA: Diagnosis present

## 2017-03-15 DIAGNOSIS — I130 Hypertensive heart and chronic kidney disease with heart failure and stage 1 through stage 4 chronic kidney disease, or unspecified chronic kidney disease: Secondary | ICD-10-CM | POA: Diagnosis present

## 2017-03-15 DIAGNOSIS — E785 Hyperlipidemia, unspecified: Secondary | ICD-10-CM | POA: Diagnosis present

## 2017-03-15 DIAGNOSIS — R778 Other specified abnormalities of plasma proteins: Secondary | ICD-10-CM

## 2017-03-15 DIAGNOSIS — L97919 Non-pressure chronic ulcer of unspecified part of right lower leg with unspecified severity: Secondary | ICD-10-CM | POA: Diagnosis present

## 2017-03-15 DIAGNOSIS — F17210 Nicotine dependence, cigarettes, uncomplicated: Secondary | ICD-10-CM | POA: Diagnosis present

## 2017-03-15 DIAGNOSIS — R6520 Severe sepsis without septic shock: Secondary | ICD-10-CM | POA: Diagnosis present

## 2017-03-15 DIAGNOSIS — G9341 Metabolic encephalopathy: Secondary | ICD-10-CM | POA: Diagnosis present

## 2017-03-15 DIAGNOSIS — I272 Pulmonary hypertension, unspecified: Secondary | ICD-10-CM | POA: Diagnosis present

## 2017-03-15 DIAGNOSIS — T148XXA Other injury of unspecified body region, initial encounter: Secondary | ICD-10-CM

## 2017-03-15 DIAGNOSIS — L03115 Cellulitis of right lower limb: Secondary | ICD-10-CM | POA: Diagnosis present

## 2017-03-15 DIAGNOSIS — Z515 Encounter for palliative care: Secondary | ICD-10-CM | POA: Diagnosis present

## 2017-03-15 DIAGNOSIS — Z7982 Long term (current) use of aspirin: Secondary | ICD-10-CM

## 2017-03-15 DIAGNOSIS — Z794 Long term (current) use of insulin: Secondary | ICD-10-CM

## 2017-03-15 DIAGNOSIS — Z8249 Family history of ischemic heart disease and other diseases of the circulatory system: Secondary | ICD-10-CM

## 2017-03-15 DIAGNOSIS — I5033 Acute on chronic diastolic (congestive) heart failure: Secondary | ICD-10-CM | POA: Diagnosis present

## 2017-03-15 DIAGNOSIS — L03119 Cellulitis of unspecified part of limb: Secondary | ICD-10-CM | POA: Diagnosis not present

## 2017-03-15 DIAGNOSIS — R4182 Altered mental status, unspecified: Secondary | ICD-10-CM | POA: Diagnosis not present

## 2017-03-15 DIAGNOSIS — I70248 Atherosclerosis of native arteries of left leg with ulceration of other part of lower left leg: Secondary | ICD-10-CM | POA: Diagnosis not present

## 2017-03-15 DIAGNOSIS — G4733 Obstructive sleep apnea (adult) (pediatric): Secondary | ICD-10-CM | POA: Diagnosis present

## 2017-03-15 DIAGNOSIS — I052 Rheumatic mitral stenosis with insufficiency: Secondary | ICD-10-CM | POA: Diagnosis present

## 2017-03-15 DIAGNOSIS — R748 Abnormal levels of other serum enzymes: Secondary | ICD-10-CM | POA: Diagnosis present

## 2017-03-15 LAB — URINALYSIS, COMPLETE (UACMP) WITH MICROSCOPIC
Bilirubin Urine: NEGATIVE
GLUCOSE, UA: NEGATIVE mg/dL
Ketones, ur: NEGATIVE mg/dL
NITRITE: NEGATIVE
PROTEIN: NEGATIVE mg/dL
Specific Gravity, Urine: 1.011 (ref 1.005–1.030)
Squamous Epithelial / LPF: NONE SEEN
pH: 5 (ref 5.0–8.0)

## 2017-03-15 LAB — COMPREHENSIVE METABOLIC PANEL
ALT: 61 U/L (ref 17–63)
AST: 64 U/L — AB (ref 15–41)
Albumin: 2.4 g/dL — ABNORMAL LOW (ref 3.5–5.0)
Alkaline Phosphatase: 148 U/L — ABNORMAL HIGH (ref 38–126)
Anion gap: 13 (ref 5–15)
BUN: 99 mg/dL — ABNORMAL HIGH (ref 6–20)
CHLORIDE: 95 mmol/L — AB (ref 101–111)
CO2: 22 mmol/L (ref 22–32)
CREATININE: 1.95 mg/dL — AB (ref 0.61–1.24)
Calcium: 8.6 mg/dL — ABNORMAL LOW (ref 8.9–10.3)
GFR, EST AFRICAN AMERICAN: 39 mL/min — AB (ref >60)
GFR, EST NON AFRICAN AMERICAN: 34 mL/min — AB (ref >60)
Glucose, Bld: 258 mg/dL — ABNORMAL HIGH (ref 65–99)
POTASSIUM: 5.2 mmol/L — AB (ref 3.5–5.1)
SODIUM: 130 mmol/L — AB (ref 135–145)
Total Bilirubin: 3.5 mg/dL — ABNORMAL HIGH (ref 0.3–1.2)
Total Protein: 7.2 g/dL (ref 6.5–8.1)

## 2017-03-15 LAB — CBC WITH DIFFERENTIAL/PLATELET
BASOS ABS: 0 10*3/uL (ref 0–0.1)
BASOS PCT: 0 %
Eosinophils Absolute: 0.1 10*3/uL (ref 0–0.7)
Eosinophils Relative: 1 %
HEMATOCRIT: 46.8 % (ref 40.0–52.0)
HEMOGLOBIN: 15.6 g/dL (ref 13.0–18.0)
LYMPHS PCT: 7 %
Lymphs Abs: 0.9 10*3/uL — ABNORMAL LOW (ref 1.0–3.6)
MCH: 32.7 pg (ref 26.0–34.0)
MCHC: 33.4 g/dL (ref 32.0–36.0)
MCV: 98.1 fL (ref 80.0–100.0)
Monocytes Absolute: 0.4 10*3/uL (ref 0.2–1.0)
Monocytes Relative: 3 %
NEUTROS ABS: 12.3 10*3/uL — AB (ref 1.4–6.5)
NEUTROS PCT: 89 %
Platelets: 150 10*3/uL (ref 150–440)
RBC: 4.77 MIL/uL (ref 4.40–5.90)
RDW: 17.1 % — AB (ref 11.5–14.5)
WBC: 13.8 10*3/uL — ABNORMAL HIGH (ref 3.8–10.6)

## 2017-03-15 LAB — BLOOD GAS, VENOUS
ACID-BASE DEFICIT: 1.5 mmol/L (ref 0.0–2.0)
Bicarbonate: 21.7 mmol/L (ref 20.0–28.0)
O2 SAT: 66.5 %
PATIENT TEMPERATURE: 37
PCO2 VEN: 32 mmHg — AB (ref 44.0–60.0)
pH, Ven: 7.44 — ABNORMAL HIGH (ref 7.250–7.430)
pO2, Ven: 33 mmHg (ref 32.0–45.0)

## 2017-03-15 LAB — TROPONIN I: Troponin I: 0.05 ng/mL (ref <0.03)

## 2017-03-15 LAB — MRSA PCR SCREENING: MRSA by PCR: NEGATIVE

## 2017-03-15 LAB — CK: CK TOTAL: 463 U/L — AB (ref 49–397)

## 2017-03-15 LAB — AMMONIA: Ammonia: 22 umol/L (ref 9–35)

## 2017-03-15 LAB — APTT: aPTT: 34 seconds (ref 24–36)

## 2017-03-15 LAB — PROTIME-INR
INR: 1.41
Prothrombin Time: 17.1 seconds — ABNORMAL HIGH (ref 11.4–15.2)

## 2017-03-15 LAB — TYPE AND SCREEN
ABO/RH(D): O POS
Antibody Screen: NEGATIVE

## 2017-03-15 LAB — LACTIC ACID, PLASMA
LACTIC ACID, VENOUS: 3.7 mmol/L — AB (ref 0.5–1.9)
LACTIC ACID, VENOUS: 3.9 mmol/L — AB (ref 0.5–1.9)

## 2017-03-15 LAB — GLUCOSE, CAPILLARY
GLUCOSE-CAPILLARY: 201 mg/dL — AB (ref 65–99)
Glucose-Capillary: 214 mg/dL — ABNORMAL HIGH (ref 65–99)

## 2017-03-15 LAB — SEDIMENTATION RATE: SED RATE: 31 mm/h — AB (ref 0–20)

## 2017-03-15 LAB — BRAIN NATRIURETIC PEPTIDE: B Natriuretic Peptide: 1560 pg/mL — ABNORMAL HIGH (ref 0.0–100.0)

## 2017-03-15 MED ORDER — TAMSULOSIN HCL 0.4 MG PO CAPS
0.4000 mg | ORAL_CAPSULE | Freq: Every day | ORAL | Status: DC
  Administered 2017-03-16 – 2017-03-21 (×6): 0.4 mg via ORAL
  Filled 2017-03-15 (×6): qty 1

## 2017-03-15 MED ORDER — SODIUM CHLORIDE 0.9 % IV BOLUS (SEPSIS)
1000.0000 mL | Freq: Once | INTRAVENOUS | Status: AC
  Administered 2017-03-15: 1000 mL via INTRAVENOUS

## 2017-03-15 MED ORDER — ONDANSETRON HCL 4 MG/2ML IJ SOLN: 4.0000 mg | Freq: Four times a day (QID) | INTRAMUSCULAR | Status: DC | PRN

## 2017-03-15 MED ORDER — INSULIN ASPART 100 UNIT/ML ~~LOC~~ SOLN
0.0000 [IU] | Freq: Three times a day (TID) | SUBCUTANEOUS | Status: DC
  Administered 2017-03-15 (×2): 3 [IU] via SUBCUTANEOUS
  Administered 2017-03-16 (×2): 2 [IU] via SUBCUTANEOUS
  Administered 2017-03-17: 09:00:00 3 [IU] via SUBCUTANEOUS
  Administered 2017-03-17: 2 [IU] via SUBCUTANEOUS
  Administered 2017-03-17: 12:00:00 3 [IU] via SUBCUTANEOUS
  Administered 2017-03-18: 1 [IU] via SUBCUTANEOUS
  Administered 2017-03-18 – 2017-03-19 (×4): 2 [IU] via SUBCUTANEOUS
  Administered 2017-03-20 (×2): 1 [IU] via SUBCUTANEOUS
  Administered 2017-03-21: 5 [IU] via SUBCUTANEOUS
  Administered 2017-03-21 – 2017-03-22 (×3): 3 [IU] via SUBCUTANEOUS
  Administered 2017-03-22: 09:00:00 5 [IU] via SUBCUTANEOUS
  Filled 2017-03-15 (×19): qty 1

## 2017-03-15 MED ORDER — ACETAMINOPHEN 650 MG RE SUPP: 650.0000 mg | Freq: Four times a day (QID) | RECTAL | Status: DC | PRN

## 2017-03-15 MED ORDER — ONDANSETRON HCL 4 MG PO TABS: 4.0000 mg | ORAL_TABLET | Freq: Four times a day (QID) | ORAL | Status: DC | PRN

## 2017-03-15 MED ORDER — PIPERACILLIN-TAZOBACTAM 3.375 G IVPB 30 MIN
3.3750 g | Freq: Once | INTRAVENOUS | Status: AC
  Administered 2017-03-15: 3.375 g via INTRAVENOUS
  Filled 2017-03-15: qty 50

## 2017-03-15 MED ORDER — VITAMIN D (ERGOCALCIFEROL) 1.25 MG (50000 UNIT) PO CAPS
50000.0000 [IU] | ORAL_CAPSULE | ORAL | Status: DC
  Filled 2017-03-15: qty 1

## 2017-03-15 MED ORDER — INSULIN ASPART 100 UNIT/ML ~~LOC~~ SOLN: 0.0000 [IU] | Freq: Three times a day (TID) | SUBCUTANEOUS | Status: DC

## 2017-03-15 MED ORDER — VANCOMYCIN HCL 10 G IV SOLR: 1250.0000 mg | INTRAVENOUS | Status: DC

## 2017-03-15 MED ORDER — ACETAMINOPHEN 650 MG RE SUPP
975.0000 mg | Freq: Once | RECTAL | Status: AC
  Administered 2017-03-15: 10:00:00 975 mg via RECTAL
  Filled 2017-03-15: qty 1

## 2017-03-15 MED ORDER — SIMVASTATIN 20 MG PO TABS: 20.0000 mg | ORAL_TABLET | Freq: Every evening | ORAL | Status: DC

## 2017-03-15 MED ORDER — INSULIN GLARGINE 100 UNIT/ML ~~LOC~~ SOLN
8.0000 [IU] | Freq: Every day | SUBCUTANEOUS | Status: DC
  Administered 2017-03-15 – 2017-03-18 (×4): 8 [IU] via SUBCUTANEOUS
  Filled 2017-03-15 (×5): qty 0.08

## 2017-03-15 MED ORDER — ACETAMINOPHEN 650 MG RE SUPP: 975.0000 mg | Freq: Once | RECTAL | Status: DC

## 2017-03-15 MED ORDER — ASPIRIN EC 81 MG PO TBEC
81.0000 mg | DELAYED_RELEASE_TABLET | Freq: Every day | ORAL | Status: DC
  Administered 2017-03-16 – 2017-03-21 (×6): 81 mg via ORAL
  Filled 2017-03-15 (×6): qty 1

## 2017-03-15 MED ORDER — ACETAMINOPHEN 325 MG PO TABS: 650.0000 mg | ORAL_TABLET | Freq: Four times a day (QID) | ORAL | Status: DC | PRN

## 2017-03-15 MED ORDER — BACITRACIN 500 UNIT/GM EX OINT
TOPICAL_OINTMENT | Freq: Every day | CUTANEOUS | Status: DC
  Administered 2017-03-15: 1 via TOPICAL
  Administered 2017-03-16: 11:00:00 via TOPICAL
  Administered 2017-03-17 – 2017-03-18 (×2): 1 via TOPICAL
  Administered 2017-03-19: 10:00:00 via TOPICAL
  Administered 2017-03-20: 1 via TOPICAL
  Administered 2017-03-21 – 2017-03-22 (×2): via TOPICAL
  Administered 2017-03-23: 1 via TOPICAL
  Filled 2017-03-15 (×11): qty 1

## 2017-03-15 MED ORDER — MONTELUKAST SODIUM 10 MG PO TABS
10.0000 mg | ORAL_TABLET | Freq: Every day | ORAL | Status: DC
  Administered 2017-03-16 – 2017-03-21 (×6): 10 mg via ORAL
  Filled 2017-03-15 (×6): qty 1

## 2017-03-15 MED ORDER — TRAMADOL HCL 50 MG PO TABS
50.0000 mg | ORAL_TABLET | Freq: Four times a day (QID) | ORAL | Status: DC | PRN
  Administered 2017-03-17 – 2017-03-22 (×5): 50 mg via ORAL
  Filled 2017-03-15 (×7): qty 1

## 2017-03-15 MED ORDER — PIPERACILLIN-TAZOBACTAM 3.375 G IVPB
3.3750 g | Freq: Three times a day (TID) | INTRAVENOUS | Status: DC
  Administered 2017-03-15 – 2017-03-18 (×9): 3.375 g via INTRAVENOUS
  Filled 2017-03-15 (×9): qty 50

## 2017-03-15 MED ORDER — TORSEMIDE 20 MG PO TABS
60.0000 mg | ORAL_TABLET | Freq: Every day | ORAL | Status: DC
  Administered 2017-03-16 – 2017-03-18 (×3): 60 mg via ORAL
  Filled 2017-03-15 (×3): qty 3

## 2017-03-15 MED ORDER — VANCOMYCIN HCL 10 G IV SOLR
1750.0000 mg | Freq: Once | INTRAVENOUS | Status: AC
  Administered 2017-03-15: 1750 mg via INTRAVENOUS
  Filled 2017-03-15: qty 1750

## 2017-03-15 MED ORDER — ENOXAPARIN SODIUM 40 MG/0.4ML ~~LOC~~ SOLN
40.0000 mg | SUBCUTANEOUS | Status: DC
  Administered 2017-03-15 – 2017-03-21 (×7): 40 mg via SUBCUTANEOUS
  Filled 2017-03-15 (×8): qty 0.4

## 2017-03-15 MED ORDER — BACITRACIN ZINC 500 UNIT/GM EX OINT
TOPICAL_OINTMENT | Freq: Two times a day (BID) | CUTANEOUS | Status: DC
  Administered 2017-03-15 – 2017-03-16 (×4): 1 via TOPICAL
  Administered 2017-03-17 – 2017-03-18 (×3): via TOPICAL
  Administered 2017-03-19: 1 via TOPICAL
  Administered 2017-03-19 – 2017-03-20 (×3): via TOPICAL
  Administered 2017-03-21: 1 via TOPICAL
  Administered 2017-03-21: 10:00:00 via TOPICAL
  Administered 2017-03-22 – 2017-03-23 (×2): 1 via TOPICAL
  Filled 2017-03-15: qty 0.9
  Filled 2017-03-15: qty 28.35

## 2017-03-15 MED ORDER — BISACODYL 5 MG PO TBEC
5.0000 mg | DELAYED_RELEASE_TABLET | Freq: Every day | ORAL | Status: DC | PRN
  Administered 2017-03-21: 14:00:00 5 mg via ORAL
  Filled 2017-03-15: qty 1

## 2017-03-15 MED ORDER — DAPTOMYCIN 500 MG IV SOLR
8.0000 mg/kg | INTRAVENOUS | Status: DC
  Administered 2017-03-15 – 2017-03-21 (×7): 685.5 mg via INTRAVENOUS
  Filled 2017-03-15 (×8): qty 13.71

## 2017-03-15 MED ORDER — POLYETHYLENE GLYCOL 3350 17 G PO PACK: 17.0000 g | PACK | Freq: Every day | ORAL | Status: DC | PRN

## 2017-03-15 NOTE — H&P (Signed)
[employer] - [place] at [facility]   PATIENT NAME: Jeremy Gutierrez    MR#:  [id number]  DATE OF BIRTH:  1950/10/02  DATE OF ADMISSION:  03/15/2017  PRIMARY CARE PHYSICIAN: Derwood Kaplan, MD   REQUESTING/REFERRING PHYSICIAN: dr Sharma Covert  CHIEF COMPLAINT:   fever HISTORY OF PRESENT ILLNESS:  Jeremy Gutierrez  is a 67 y.o. 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 On arrival to the emergency room patient had temperature 102.7, tachycardia and tachypnea.  Patient was started on Zosyn and vancomycin for sepsis.  PAST MEDICAL HISTORY:   Past Medical History:  Diagnosis Date  . Aneurysm (HCC)   . Arthritis   . CHF (congestive heart failure) (HCC)   . Diabetes mellitus without complication (HCC)   . Heart murmur    as a child- 'nothing to worry about'  . Sleep apnea     PAST SURGICAL HISTORY:   Past Surgical History:  Procedure Laterality Date  . BUNIONECTOMY Right   . CARDIAC CATHETERIZATION  2010  . CATARACT EXTRACTION W/PHACO Right 06/05/2012   Procedure: CATARACT EXTRACTION PHACO AND INTRAOCULAR LENS PLACEMENT (IOC);  Surgeon: Shade Flood, MD;  Location: [facility] OR;  Service: Ophthalmology;  Laterality: Right;  . Colonscopy  2012  . TEE WITHOUT CARDIOVERSION N/A 02/14/2017   Procedure: TRANSESOPHAGEAL ECHOCARDIOGRAM (TEE);  Surgeon: Dalia Heading, MD;  Location: [facility] ORS;  Service: Cardiovascular;  Laterality: N/A;    SOCIAL HISTORY:   Social History   Tobacco Use  . Smoking status: Current Every Day Smoker    Packs/day: 0.50    Years: 40.00    Pack years: 20.00    Types: Cigarettes    Last attempt to quit: 06/02/2012    Years since quitting: 4.7  . Smokeless tobacco: Never Used  . Tobacco comment: smoked for many  years.  Substance Use Topics  . Alcohol use: Yes    Comment: social    FAMILY HISTORY:   Family History  Problem Relation Age of Onset  . CAD Mother   . Cancer Sister     DRUG ALLERGIES:   Allergies  Allergen Reactions  . Sulfa Antibiotics Hives    Patient states he is not allergic to this medication    REVIEW OF SYSTEMS:   Review of Systems  Unable to perform ROS: Acuity of condition  Psychiatric/Behavioral: Positive for memory loss.    MEDICATIONS AT HOME:   Prior to Admission medications   Medication Sig Start Date End Date Taking? Authorizing Provider  aspirin 81 MG tablet Take 81 mg by mouth daily.   Yes [provider]  carvedilol (COREG) 3.125 MG tablet Take 3.125 mg by mouth 2 (two) times daily with a meal.   Yes [provider]  collagenase (SANTYL) ointment Apply 1 application topically daily as needed.   Yes [provider]  insulin lispro protamine-lispro (HUMALOG 75/25 MIX) (75-25) 100 UNIT/ML SUSP injection Inject 15 Units into the skin 2 (two) times daily with a meal. 02/18/17  Yes Kamila Broda, MD  montelukast (SINGULAIR) 10 MG tablet take 1 tablet daily 12/23/16  Yes [provider]  simvastatin (ZOCOR) 20 MG tablet Take 20 mg by mouth every evening.   Yes [provider]  tamsulosin (FLOMAX) 0.4 MG CAPS capsule Take 0.4 mg by  mouth daily. 01/24/17  Yes [provider]  torsemide (DEMADEX) 20 MG tablet Take 3 tablets (60 mg total) by mouth daily. Patient taking differently: Take 20-40 mg by mouth 2 (two) times daily.  02/19/17  Yes Peterson Mathey, Patricia Pesa, MD  vancomycin (VANCOCIN) 1-5 GM/200ML-% SOLN Inject 200 mLs (1,000 mg total) into the vein every 18 (eighteen) hours. 02/18/17 03/22/17 Yes ModyPatricia Pesa, MD  Vitamin D, Ergocalciferol, (DRISDOL) 50000 units CAPS capsule Take 1 capsule by mouth every 30 (thirty) days. 01/24/17  Yes [provider]  hydrOXYzine (ATARAX/VISTARIL) 25 MG tablet TAKE 1 TO 2 TABLETS  BY MOUTH AT BEDTIME IF NEEDED FOR ITCHING 01/08/17   [provider]  ONE TOUCH ULTRA TEST test strip  12/20/16   [provider]      VITAL SIGNS:  Blood pressure (!) 91/59, pulse 99, temperature 98.8 F (37.1 C), temperature source Oral, resp. rate 20, height 5\' 11"  (1.803 m), weight 93.9 kg (207 lb), SpO2 100 %.  PHYSICAL EXAMINATION:   Physical Exam  Constitutional: He is well-developed, well-nourished, and in no distress. No distress.  Chronically ill-appearing  HENT:  Head: Normocephalic.  Eyes: No scleral icterus.  Neck: Normal range of motion. Neck supple. No JVD present. No tracheal deviation present.  Cardiovascular: Normal rate, regular rhythm and normal heart sounds. Exam reveals no gallop and no friction rub.  No murmur heard. Pulmonary/Chest: Effort normal and breath sounds normal. No respiratory distress. He has no wheezes. He has no rales. He exhibits no tenderness.  Abdominal: Soft. Bowel sounds are normal. He exhibits no distension and no mass. There is no tenderness. There is no rebound and no guarding.  Musculoskeletal: Normal range of motion. He exhibits edema and tenderness.  Neurological:  Opens his eyes but appears confused Is not cooperate with neurological examination  Skin: Skin is warm. No rash noted. No erythema.  Patient has bilateral chronic lower extremity ulcerations the right leg is red and very tender edematous all of the up to the knee with purulent discharge from his wounds. Left lower extremity also with open wounds Patient has 3 small stage II sacral decubitus ulcers  Psychiatric:  Confused      LABORATORY PANEL:   CBC Recent Labs  Lab 03/15/17 0928  WBC 13.8*  HGB 15.6  HCT 46.8  PLT 150   ------------------------------------------------------------------------------------------------------------------  Chemistries  Recent Labs  Lab 03/15/17 0928  NA 130*  K 5.2*  CL 95*  CO2 22  GLUCOSE 258*  BUN 99*   CREATININE 1.95*  CALCIUM 8.6*  AST 64*  ALT 61  ALKPHOS 148*  BILITOT 3.5*   ------------------------------------------------------------------------------------------------------------------  Cardiac Enzymes Recent Labs  Lab 03/15/17 0928  TROPONINI 0.05*   ------------------------------------------------------------------------------------------------------------------  RADIOLOGY:  Dg Tibia/fibula Left  Result Date: 03/15/2017 CLINICAL DATA:  Altered mental status.  Soft tissue infection. EXAM: LEFT TIBIA AND FIBULA - 2 VIEW COMPARISON:  None. FINDINGS: Evidence of fracture or osteomyelitis. There is soft tissue swelling. There are arterial and venous calcifications. IMPRESSION: No bone abnormality seen.  Soft tissue swelling. Electronically Signed   By: Paulina Fusi M.D.   On: 03/15/2017 10:32   Dg Tibia/fibula Right  Result Date: 03/15/2017 CLINICAL DATA:  Oozing wounds on the right lower leg of uncertain chronicity. No known injury. EXAM: RIGHT TIBIA AND FIBULA - 2 VIEW COMPARISON:  None. FINDINGS: There is infiltration of subcutaneous fat about the right lower leg compatible with cellulitis or dependent change. No soft tissue gas or radiopaque foreign body is  identified. No bony destructive change or periosteal reaction. No fracture or dislocation. Advanced medial compartment osteoarthritis right knee noted. IMPRESSION: Findings compatible with cellulitis and/or dependent change in the right lower leg. Negative for soft tissue gas or evidence of osteomyelitis. Right knee osteoarthritis appears advanced in the medial compartment. Electronically Signed   By: Drusilla Kannerhomas  Dalessio M.D.   On: 03/15/2017 10:33   Dg Chest Portable 1 View  Result Date: 03/15/2017 CLINICAL DATA:  Fever and altered mental status. PICC line. Cellulitis. EXAM: PORTABLE CHEST 1 VIEW COMPARISON:  02/09/2017 FINDINGS: Chronic cardiomegaly. Right arm PICC tip in the SVC 3 cm above the right atrium. The lungs are  clear. No edema or effusions. No significant bone finding. IMPRESSION: Chronic cardiomegaly.  No active disease.  Right arm PICC. Electronically Signed   By: Paulina FusiMark  Shogry M.D.   On: 03/15/2017 10:31    EKG:   Sinus tachycardia no ST elevation or depression  IMPRESSION AND PLAN:   67 year old male with recent diagnosis of endocarditis due to chronic lower extremity wounds currently on IV vancomycin followed by infectious diseasewho presents from [facility] with fevers and lethargy.  1. Sepsis: Patient presents with fever, tachypnea, tachycardia and now with hypotension Sepsis is likely due to right lower extremity ellulitis and chronic wounds He also has PICC line which may be etiology of sepsis  Discussed with Dr. Sampson GoonFitzgerald  Continue vancomycin and Zosyn for now Consider discontinuing PICC line and culture needed from PICC line Follow-up on final blood cultures and I have asked nurse to culture wounds Surgical and vascular surgical evaluation requested. Consider MRI to evaluate for underlying osteomyelitis    2 recent diagnosis of.  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 Sampson GoonFitzgerald  Patient will need cardiothoracic evaluation once the infection clears   3. Acute metabolic encephalopathy due to sepsis and underlying dementia  4. Diabetes: Start sliding scale and ADA diet Start low-dose Lantus for now Diabetes nurse consultation requested  5. Chronic diastolic heart failure with preserved ejection fraction: Continue torsemide  6. Acute on Chronic kidney disease stage III: Creatinine increased from baseline from sepsis with ATN.  7BPH: Continue Flomax     Patient very sick today with sepsis and leg wounds.  All the records are reviewed and case discussed with ED provider. Management plans discussed with the patient and tried to leave message with  daughter (mailbox not set up).  CODE STATUS: FULL  Critical care TOTAL TIME TAKING CARE OF THIS PATIENT: 50 minutes.    Legion Discher M.D on 03/15/2017 at 11:20 AM  Between 7am to 6pm - Pager - [phone]  After 6pm go to [email or web address] - Social research officer, governmentpassword EPAS [facility]  [facility] [place] [facility]  Office  [phone]  CC: Primary care physician; Derwood KaplanEason, Jeremy B, MD

## 2017-03-15 NOTE — ED Notes (Signed)
Date and time results received: 03/15/17 1056 (use smartphrase ".now" to insert current time)  Test: troponin Critical Value: 0.05  Name of Provider Notified: norman

## 2017-03-15 NOTE — ED Provider Notes (Addendum)
[facility] Emergency Department Provider Note  ____________________________________________  Time seen: Approximately 9:46 AM  I have reviewed the triage vital signs and the nursing notes.   HISTORY  Chief Complaint Altered Mental Status  The patient's history and physical is limited due to his altered mental status.  HPI Jeremy Gutierrez is a 67 y.o. male with indwelling PICC line for vancomycin treatment of endocarditis, bilateral lower extremity cellulitis, decubitus ulcers, dementia brought for altered mental status and fever.  On arrival to the emergency department, the patient can open his eyes but otherwise is nonverbal.  He is febrile to 102.7, bradycardic with a blood pressure 107/71 but significant tachypnea.  His mucous membranes are dry and he has multiple concerning lower extremity wounds.   Past Medical History:  Diagnosis Date  . Aneurysm (HCC)   . Arthritis   . CHF (congestive heart failure) (HCC)   . Diabetes mellitus without complication (HCC)   . Heart murmur    as a child- 'nothing to worry about'  . Sleep apnea     Patient Active Problem List   Diagnosis Date Noted  . Dementia 02/15/2017  . Pressure injury of skin 02/13/2017  . MRSA bacteremia 02/11/2017  . Sepsis (HCC) 02/09/2017  . Tobacco use disorder 01/23/2017  . Hyperglycemia 04/02/2016  . Essential hypertension, benign 02/29/2016  . AAA (abdominal aortic aneurysm) without rupture (HCC) 02/29/2016  . Diabetes (HCC) 02/29/2016  . Abscess of right thigh 10/29/2012    Past Surgical History:  Procedure Laterality Date  . BUNIONECTOMY Right   . CARDIAC CATHETERIZATION  2010  . CATARACT EXTRACTION W/PHACO Right 06/05/2012   Procedure: CATARACT EXTRACTION PHACO AND INTRAOCULAR LENS PLACEMENT (IOC);  Surgeon: Shade Flood, MD;  Location: [facility] OR;  Service: Ophthalmology;  Laterality: Right;  . Colonscopy  2012  . TEE WITHOUT CARDIOVERSION N/A 02/14/2017   Procedure:  TRANSESOPHAGEAL ECHOCARDIOGRAM (TEE);  Surgeon: Dalia Heading, MD;  Location: [facility] ORS;  Service: Cardiovascular;  Laterality: N/A;    Current Outpatient Rx  . Order #: [id number] Class: Historical Med  . Order #: [id number] Class: Historical Med  . Order #: [id number] Class: Historical Med  . Order #: [id number] Class: Normal  . Order #: [id number] Class: Historical Med  . Order #: [id number] Class: Historical Med  . Order #: [id number] Class: Historical Med  . Order #: [id number] Class: No Print  . Order #: [id number] Class: Print  . Order #: [id number] Class: Historical Med  . Order #: [id number] Class: Historical Med  . Order #: [id number] Class: Historical Med    Allergies Sulfa antibiotics  Family History  Problem Relation Age of Onset  . CAD Mother   . Cancer Sister     Social History Social History   Tobacco Use  . Smoking status: Current Every Day Smoker    Packs/day: 0.50    Years: 40.00    Pack years: 20.00    Types: Cigarettes    Last attempt to quit: 06/02/2012    Years since quitting: 4.7  . Smokeless tobacco: Never Used  . Tobacco comment: smoked for many years.  Substance Use Topics  . Alcohol use: Yes    Comment: social  . Drug use: No    Review of Systems Limited due to patient nonverbal. ____________________________________________   PHYSICAL EXAM:  VITAL SIGNS: ED Triage Vitals  Enc Vitals Group     BP 03/15/17 0941 107/71     Pulse Rate 03/15/17 0941 (!) 56     Resp 03/15/17 0941 (!) 28  Temp 03/15/17 0941 (!) 102.7 F (39.3 C)     Temp Source 03/15/17 0941 Rectal     SpO2 03/15/17 0941 100 %     Weight 03/15/17 0925 207 lb (93.9 kg)     Height 03/15/17 0925 5\' 11"  (1.803 m)     Head Circumference --      Peak Flow --      Pain Score --      Pain Loc --      Pain Edu? --      Excl. in GC? --      Constitutional: The patient is alert and protecting his airway, but is nonverbal and does not follow commands.   Eyes: Conjunctivae are normal.  EOMI.  PERRLA.  No scleral icterus.  No eye discharge. Head: Atraumatic. Nose: No congestion/rhinnorhea. Mouth/Throat: Mucous membranes are very dry.  Neck: No stridor.  Supple.  No JVD.  No meningismus. Cardiovascular: Slow rate, regular rhythm. No murmurs, rubs or gallops.  Respiratory: The patient is tachypneic with accessory muscle use and mild retractions.  His lungs are clear bilaterally and his oxygenating 100% on exogenous O2. Gastrointestinal: Overweight.  Soft, nontender and mildly distended.  No guarding or rebound.  No peritoneal signs. Musculoskeletal: The patient has significant edema with wounds throughout the bilateral lower extremities from the feet to the distal thighs.  He has overlying erythema and warmth, skin breakdown and blistering.  He has dopplerable DP pulses bilaterally.  He is able to move the legs.  Per report, the patient has 3 small sacral decubitus ulcers.  Right upper extremity PICC line without surrounding erythema, fluctuance or discharge. Neurologic: alert but nonverbal.   Face and smile are symmetric.  EOMI. PERRLA.  Moves all extremities well. Skin: Skin exam of the lower extremities and buttocks above.  The patient also has a superficial abrasion with overlying erythema on the right side of the neck without any fluctuance. Psychiatric: Able to assess due to nonverbal.  ____________________________________________   LABS (all labs ordered are listed, but only abnormal results are displayed)  Labs Reviewed  COMPREHENSIVE METABOLIC PANEL - Abnormal; Notable for the following components:      Result Value   Sodium 130 (*)    Potassium 5.2 (*)    Chloride 95 (*)    Glucose, Bld 258 (*)    BUN 99 (*)    Creatinine, Ser 1.95 (*)    Calcium 8.6 (*)    Albumin 2.4 (*)    AST 64 (*)    Alkaline Phosphatase 148 (*)    Total Bilirubin 3.5 (*)    GFR calc non Af Amer 34 (*)    GFR calc Af Amer 39 (*)    All other components within normal limits  LACTIC ACID,  PLASMA - Abnormal; Notable for the following components:   Lactic Acid, Venous 3.7 (*)    All other components within normal limits  CBC WITH DIFFERENTIAL/PLATELET - Abnormal; Notable for the following components:   WBC 13.8 (*)    RDW 17.1 (*)    Neutro Abs 12.3 (*)    Lymphs Abs 0.9 (*)    All other components within normal limits  PROTIME-INR - Abnormal; Notable for the following components:   Prothrombin Time 17.1 (*)    All other components within normal limits  URINALYSIS, COMPLETE (UACMP) WITH MICROSCOPIC - Abnormal; Notable for the following components:   Color, Urine AMBER (*)    APPearance CLOUDY (*)    Hgb urine dipstick  SMALL (*)    Leukocytes, UA LARGE (*)    Bacteria, UA MANY (*)    All other components within normal limits  TROPONIN I - Abnormal; Notable for the following components:   Troponin I 0.05 (*)    All other components within normal limits  BLOOD GAS, VENOUS - Abnormal; Notable for the following components:   pH, Ven 7.44 (*)    pCO2, Ven 32 (*)    All other components within normal limits  CULTURE, BLOOD (ROUTINE X 2)  CULTURE, BLOOD (ROUTINE X 2)  CULTURE, BLOOD (ROUTINE X 2)  CULTURE, BLOOD (ROUTINE X 2)  URINE CULTURE  APTT  AMMONIA  LACTIC ACID, PLASMA  URINALYSIS, ROUTINE W REFLEX MICROSCOPIC  BRAIN NATRIURETIC PEPTIDE  SEDIMENTATION RATE  CBG MONITORING, ED  TYPE AND SCREEN   ____________________________________________  EKG ED ECG REPORT I, Rockne Menghini, the attending physician, personally viewed and interpreted this ECG.   Date: 03/15/2017  EKG Time: 928  Rate: 110  Rhythm: sinus tachycardia; poor baseline tracing  Axis: normal  Intervals:none  ST&T Change: No STEMI  ____________________________________________  RADIOLOGY  Dg Tibia/fibula Left  Result Date: 03/15/2017 CLINICAL DATA:  Altered mental status.  Soft tissue infection. EXAM: LEFT TIBIA AND FIBULA - 2 VIEW COMPARISON:  None. FINDINGS: Evidence of fracture  or osteomyelitis. There is soft tissue swelling. There are arterial and venous calcifications. IMPRESSION: No bone abnormality seen.  Soft tissue swelling. Electronically Signed   By: Paulina Fusi M.D.   On: 03/15/2017 10:32   Dg Tibia/fibula Right  Result Date: 03/15/2017 CLINICAL DATA:  Oozing wounds on the right lower leg of uncertain chronicity. No known injury. EXAM: RIGHT TIBIA AND FIBULA - 2 VIEW COMPARISON:  None. FINDINGS: There is infiltration of subcutaneous fat about the right lower leg compatible with cellulitis or dependent change. No soft tissue gas or radiopaque foreign body is identified. No bony destructive change or periosteal reaction. No fracture or dislocation. Advanced medial compartment osteoarthritis right knee noted. IMPRESSION: Findings compatible with cellulitis and/or dependent change in the right lower leg. Negative for soft tissue gas or evidence of osteomyelitis. Right knee osteoarthritis appears advanced in the medial compartment. Electronically Signed   By: Drusilla Kanner M.D.   On: 03/15/2017 10:33   Dg Chest Portable 1 View  Result Date: 03/15/2017 CLINICAL DATA:  Fever and altered mental status. PICC line. Cellulitis. EXAM: PORTABLE CHEST 1 VIEW COMPARISON:  02/09/2017 FINDINGS: Chronic cardiomegaly. Right arm PICC tip in the SVC 3 cm above the right atrium. The lungs are clear. No edema or effusions. No significant bone finding. IMPRESSION: Chronic cardiomegaly.  No active disease.  Right arm PICC. Electronically Signed   By: Paulina Fusi M.D.   On: 03/15/2017 10:31    ____________________________________________   PROCEDURES  Procedure(s) performed: None  Procedures  Critical Care performed: Yes ____________________________________________   INITIAL IMPRESSION / ASSESSMENT AND PLAN / ED COURSE  Pertinent labs & imaging results that were available during my care of the patient were reviewed by me and considered in my medical decision making (see chart  for details).  67 y.o. male currently under treatment for endocarditis through PICC line with vancomycin, here with fever and altered mental status.  Overall, I am concerned about severe sepsis in this patient.  He has severe concerning bilateral lower extremity wounds with overlying cellulitis, he is tachypneic and I am concerned about a metabolic acidosis.  We will get a chest x-ray to rule out a pulmonary infection.  We will get urinalysis to rule out UTI.  Blood cultures have been ordered for concern of ongoing bacteremia from endocarditis.  Lactic acid and VBG are also pending.  Zosyn has been ordered, and will call the nursing home to see if the patient had his dose of vancomycin today as his MAR was not sent with him.    I have attempted to call the patient's emergency contact, with no answer and no voicemail that is set up.  The patient's paperwork states that he is full code.  ----------------------------------------- 11:03 AM on 03/15/2017 -----------------------------------------  At this time, the patient is grossly unchanged.  The patient does have evidence of a UTI, and has received Zosyn.  We called his nursing home, and his last vancomycin dose was at 1 PM yesterday, so I have written him for weight-based vancomycin dosing as well.  The patient does have an elevated lactic acid at 3.7 and an elevated white blood cell count 13.8.  The patient's lower extremity x-rays do not show any free air or gas, and there is no evidence of underlying osteomyelitis.  The patient has an elevated troponin at 0.05 today.  This will need to be trended.  The patient has multiple other abnormalities including acute renal insufficiency, hyperglycemia without DKA, hyponatremia, mild hyperkalemia of 5.2 without peak T waves or widened QRS.  ----------------------------------------- 11:11 AM on 03/15/2017 -----------------------------------------  I just spoke to the patient's daughter, who is up to date  on all the findings, and will come early this afternoon to see him.    CRITICAL CARE Performed by: Rockne Menghini   Total critical care time: 60 minutes  Critical care time was exclusive of separately billable procedures and treating other patients.  Critical care was necessary to treat or prevent imminent or life-threatening deterioration.  Critical care was time spent personally by me on the following activities: development of treatment plan with patient and/or surrogate as well as nursing, discussions with consultants, evaluation of patient's response to treatment, examination of patient, obtaining history from patient or surrogate, ordering and performing treatments and interventions, ordering and review of laboratory studies, ordering and review of radiographic studies, pulse oximetry and re-evaluation of patient's condition.   ____________________________________________  FINAL CLINICAL IMPRESSION(S) / ED DIAGNOSES  Final diagnoses:  Sepsis, due to unspecified organism (HCC)  Cellulitis, unspecified cellulitis site  Open wound  Hyponatremia  Acute renal insufficiency  Elevated troponin         NEW MEDICATIONS STARTED DURING THIS VISIT:  New Prescriptions   No medications on file      Rockne Menghini, MD 03/15/17 1111    Rockne Menghini, MD 03/15/17 1112

## 2017-03-15 NOTE — ED Notes (Signed)
Code  Sepsis  Called  To  [facility] 

## 2017-03-15 NOTE — ED Notes (Signed)
Date and time results received: 03/15/17 1009 (use smartphrase ".now" to insert current time)  Test: lactic acid Critical Value: 3.7  Name of Provider Notified: norman
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
## 2017-03-15 NOTE — Progress Notes (Signed)
Inpatient Diabetes Program Recommendations  AACE/ADA: New Consensus Statement on Inpatient Glycemic Control (2015)  Target Ranges:  Prepandial:   less than 140 mg/dL      Peak postprandial:   less than 180 mg/dL (1-2 hours)      Critically ill patients:  140 - 180 mg/dL   Results for Jeremy Gutierrez, Jeremy Gutierrez (MRN [id number]) as of 03/15/2017 11:43  Ref. Range 03/15/2017 11:20  Glucose-Capillary Latest Ref Range: 65 - 99 mg/dL 409214 (H)  Results for Jeremy Gutierrez, Jeremy Gutierrez (MRN [id number]) as of 03/15/2017 11:43  Ref. Range 03/15/2017 09:28  Glucose Latest Ref Range: 65 - 99 mg/dL 956258 (H)  Results for Jeremy Gutierrez, Jeremy Gutierrez (MRN [id number]) as of 03/15/2017 11:43  Ref. Range 02/09/2017 16:40  Hemoglobin A1C Latest Ref Range: 4.8 - 5.6 % 9.8 (H)   Review of Glycemic Control  Outpatient Diabetes medications: Humalog 75/25 15 units BID Current orders for Inpatient glycemic control: Lantus 8 units daily, Novolog 0-9 units TID with meals  Inpatient Diabetes Program Recommendations: Correction (SSI): Please consider ordering Novolog 0-5 units QHS for bedtime correction scale. Insulin - Meal Coverage: If patient is eating and if post prandial glucose is consistently greater than 180 mg/dl, please consider ordering Novolog 4 units TID with meals for meal coverage. HgbA1C: A1C 9.8% on 02/09/17 indicating an average glucose of 235 mg/dl.  NOTE: Noted consult for Diabetes Coordinator. Chart reviewed. In reviewing chart, noted patient was inpatient from 02/11/17 to 02/18/17. Prior to that admission patient was taking 75/25 30 units with breakfast and 25 units at bedtime. Patient was discharged on 02/18/17 on 75/25 15 units BID. Inpatient Diabetes Coordinator Wilkie Aye([place]. [place]) spoke with patient on 02/16/17 (see note for details). Agree with Lantus and sensitive Novolog correction scale as ordered. Would recommend ordering Novolog correction scale for bedtime and if post prandial glucose is consistently elevated to add Novolog meal  coverage as well.  Thanks, Orlando PennerMarie Sheana Bir, RN, MSN, CDE Diabetes Coordinator Inpatient Diabetes Program [phone] (Team Pager from 8am to 5pm)

## 2017-03-15 NOTE — Progress Notes (Signed)
ANTIBIOTIC CONSULT NOTE - INITIAL  Pharmacy Consult for daptomycin Indication: MV endocarditis  Allergies  Allergen Reactions  . Sulfa Antibiotics Hives    Patient states he is not allergic to this medication    Patient Measurements: Height: 5\' 11"  (180.3 cm) Weight: 223 lb 8 oz (101.4 kg) IBW/kg (Calculated) : 75.3 Adjusted Body Weight: 85.7 kg  Vital Signs: Temp: 98 F (36.7 C) (01/24 1253) Temp Source: Oral (01/24 1253) BP: 94/57 (01/24 1253) Pulse Rate: 94 (01/24 1255) Intake/Output from previous day: No intake/output data recorded. Intake/Output from this shift: No intake/output data recorded.  Labs: Recent Labs    03/15/17 0928  WBC 13.8*  HGB 15.6  PLT 150  CREATININE 1.95*   Estimated Creatinine Clearance: 43.9 mL/min (A) (by C-G formula based on SCr of 1.95 mg/dL (H)). No results for input(s): VANCOTROUGH, VANCOPEAK, VANCORANDOM, GENTTROUGH, GENTPEAK, GENTRANDOM, TOBRATROUGH, TOBRAPEAK, TOBRARND, AMIKACINPEAK, AMIKACINTROU, AMIKACIN in the last 72 hours.   Microbiology: No results found for this or any previous visit (from the past 720 hour(s)).  Medical History: Past Medical History:  Diagnosis Date  . Aneurysm (HCC)   . Arthritis   . CHF (congestive heart failure) (HCC)   . Diabetes mellitus without complication (HCC)   . Heart murmur    as a child- 'nothing to worry about'  . Sleep apnea     Medications:  Infusions:  . DAPTOmycin (CUBICIN)  IV    . piperacillin-tazobactam (ZOSYN)  IV     Assessment: 66 yom cc fevers - on treatment OP for MSSA and CoNS endocarditis now presenting with fevers and wound infection. ID prescribes switch from vancomycin to daptomycin. Pharmacy consulted for daptomycin dosing.  Goal of Therapy:  Resolve infection Prevent ADE  Plan:  1. Hold statin 2. Check CK 3. Daptomycin 8 mg/kg adjusted body weight due to BMI greater than 30 (685.5 mg) IV Q24H 4. F/u repeat cultures - dose may need to be increased if  enterococcus isolated.  Carola FrostNathan A Abhinav Mayorquin, Pharm.D., BCPS Clinical Pharmacist 03/15/2017,4:01 PM

## 2017-03-15 NOTE — Clinical Social Work Note (Signed)
Clinical Social Work Assessment  Patient Details  Name: Jeremy Gutierrez MRN: [id number] Date of Birth: 1950/03/17  Date of referral:  03/15/17               Reason for consult:  Other (Comment Required)(from [facility] STR)                Permission sought to share information with:  Oceanographer granted to share information::  Yes, Verbal Permission Granted  Name::      [facility]   Agency::     Relationship::     Contact Information:     Housing/Transportation Living arrangements for the past 2 months:  Skilled Holiday representative, Single Family Home Source of Information:  Adult Children, Facility Patient Interpreter Needed:  None Criminal Activity/Legal Involvement Pertinent to Current Situation/Hospitalization:  No - Comment as needed Significant Relationships:  Adult Children Lives with:  Adult Children, Facility Resident Do you feel safe going back to the place where you live?  Yes Need for family participation in patient care:  Yes (Comment)  Care giving concerns:  Patient came into [facility] from [facility] where he was at for short term rehab and a PICC line for IV ABX.    Social Worker assessment / plan:  Visual merchandiser (CSW) received consult from MD that patient is from [facility]. CSW is familiar with patient and his daughter [place] from previous admission at [facility]. On previous admission psych determined that patient does not have capacity to make decisions so patient's daughter sent patient to [facility] for rehab and PICC line treatment. CSW contacted patient's daughter today. Daughter reported that patient has been at [facility] for about 1 month now and has declined because he refuses to take medicine. Daughter reported that she has tried to pursue guardianship however she become tearful and stated that she is overwhelmed with the process. CSW provided emotional support. Per  daughter she would like for patient to return to [facility] and wants him in long term care. Per daughter she wants patient to be comfortable and "stop fighting if that is what he wants." CSW made daughter aware that palliative team will meet with her to discuss goals of care. Per [facility] admissions coordinator at [facility] patient is under short term rehab however they will assist patient is transitioning to long term care. Per Tresa Endo patient can return to [facility] when stable. CSW will continue to follow and assist as needed.   Employment status:  Disabled (Comment on whether or not currently receiving Disability), Retired Health and safety inspector:  Medicare PT Recommendations:  Not assessed at this time Information / Referral to community resources:  Skilled Nursing Facility  Patient/Family's Response to care:  Patient's daughter would like to meet with palliative to discuss goals of care.   Patient/Family's Understanding of and Emotional Response to Diagnosis, Current Treatment, and Prognosis:  Patient's daughter was tearful and appeared overwhelmed. CSW provided emotional support.    Emotional Assessment Appearance:  Appears stated age Attitude/Demeanor/Rapport:  Unable to Assess Affect (typically observed):  Unable to Assess Orientation:  Oriented to Self, Oriented to Place, Fluctuating Orientation (Suspected and/or reported Sundowners) Alcohol / Substance use:  Not Applicable Psych involvement (Current and /or in the community):  No (Comment)  Discharge Needs  Concerns to be addressed:  Discharge Planning Concerns Readmission within the last 30 days:  No Current discharge risk:  Dependent with Mobility, Cognitively Impaired, Chronically ill Barriers to Discharge:  Continued Medical Work up   [employer], Darleen CrockerBailey M, LCSW 03/15/2017, 1:53 PM

## 2017-03-15 NOTE — Progress Notes (Signed)
ANTIBIOTIC CONSULT NOTE - INITIAL  Pharmacy Consult for Zosyn and vancomycin Indication: sepsis  Allergies  Allergen Reactions  . Sulfa Antibiotics Hives    Patient states he is not allergic to this medication    Patient Measurements: Height: 5\' 11"  (180.3 cm) Weight: 207 lb (93.9 kg) IBW/kg (Calculated) : 75.3 Adjusted Body Weight:   Vital Signs: Temp: 98.8 F (37.1 C) (01/24 1116) Temp Source: Oral (01/24 1116) BP: 101/67 (01/24 1219) Pulse Rate: 92 (01/24 1219) Intake/Output from previous day: No intake/output data recorded. Intake/Output from this shift: No intake/output data recorded.  Labs: Recent Labs    03/15/17 0928  WBC 13.8*  HGB 15.6  PLT 150  CREATININE 1.95*   Estimated Creatinine Clearance: 42.4 mL/min (A) (by C-G formula based on SCr of 1.95 mg/dL (H)). No results for input(s): VANCOTROUGH, VANCOPEAK, VANCORANDOM, GENTTROUGH, GENTPEAK, GENTRANDOM, TOBRATROUGH, TOBRAPEAK, TOBRARND, AMIKACINPEAK, AMIKACINTROU, AMIKACIN in the last 72 hours.   Microbiology: No results found for this or any previous visit (from the past 720 hour(s)).  Medical History: Past Medical History:  Diagnosis Date  . Aneurysm (HCC)   . Arthritis   . CHF (congestive heart failure) (HCC)   . Diabetes mellitus without complication (HCC)   . Heart murmur    as a child- 'nothing to worry about'  . Sleep apnea     Medications:  Infusions:  . piperacillin-tazobactam (ZOSYN)  IV    . [START ON 03/16/2017] vancomycin    . vancomycin 1,750 mg (03/15/17 1131)   Assessment: 66 yom cc fever with PMH MSSA and CoNS mitral valve IE currently on vancomycin. Pharmacy consulted to dose vancomycin and Zosyn for sepsis.   Goal of Therapy:  Vancomycin trough level 15-20 mcg/ml  Plan:  1. Zosyn 3.375 gm IV Q8H EI 2. Vancomycin OP reported as Q18H. Vancomycin random not assessed before giving 1.75 gm IV x 1 dose. Recalculated population kinetics estimates trough of 17 mcg/mL with 1.25  gm IV Q24H so we will plan on that dose but will check level tomorrow before dose.    Carola FrostNathan A Mayank Teuscher, Pharm.D., BCPS Clinical Pharmacist 03/15/2017,12:25 PM

## 2017-03-15 NOTE — ED Triage Notes (Signed)
From [facility]. Current picc with vanc for mrsa. 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 AMS present.

## 2017-03-15 NOTE — Consult Note (Signed)
[facility] Infectious Disease     Reason for Consult:Sepsis, LE ulcers   Referring Physician: Bettey Costa Date of Admission:  03/15/2017   Active Problems:   Sepsis [facility])   HPI: Jeremy Gutierrez is a 67 y.o. 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 He also has CHF, AAA and chronic Bil LE edema, venous stasis ulceration and DM.  He was seen by vascular during his recent admission and on 12/22 was rec to have unnaboots and otpt fu but reports no evidence arterial disease.  He had not seen vascular since dc.  Yesterday his SNF provider called me to report that the leg swelling and ulcers were worsening and I suggested add augmentin and check cx and get back into see vascular but  he was admitted prior to this being done. On admit temp 102.7 and wbc 13.8.  Cxs from blood and wound pending.  He remains sleepy and unable to provide much history. Daughter is at bedside.   Past Medical History:  Diagnosis Date  . Aneurysm ([place])   . Arthritis   . CHF (congestive heart failure) ([place])   . Diabetes mellitus without complication ([place])   . Heart murmur    as a child- 'nothing to worry about'  . Sleep apnea    Past Surgical History:  Procedure Laterality Date  . BUNIONECTOMY Right   . CARDIAC CATHETERIZATION  2010  . CATARACT EXTRACTION W/PHACO Right 06/05/2012   Procedure: CATARACT EXTRACTION PHACO AND INTRAOCULAR LENS PLACEMENT (IOC);  Surgeon: Adonis Brook, MD;  Location: [place];  Service: Ophthalmology;  Laterality: Right;  . Colonscopy  2012  . TEE WITHOUT CARDIOVERSION N/A 02/14/2017   Procedure: TRANSESOPHAGEAL ECHOCARDIOGRAM (TEE);  Surgeon: Teodoro Spray, MD;  Location: [facility] ORS;  Service: Cardiovascular;  Laterality: N/A;   Social History   Tobacco Use  . Smoking status: Current Every Day Smoker    Packs/day: 0.50    Years: 40.00    Pack years: 20.00    Types: Cigarettes     Last attempt to quit: 06/02/2012    Years since quitting: 4.7  . Smokeless tobacco: Never Used  . Tobacco comment: smoked for many years.  Substance Use Topics  . Alcohol use: Yes    Comment: social  . Drug use: No   Family History  Problem Relation Age of Onset  . CAD Mother   . Cancer Sister     Allergies:  Allergies  Allergen Reactions  . Sulfa Antibiotics Hives    Patient states he is not allergic to this medication    Current antibiotics: Antibiotics Given (last 72 hours)    Date/Time Action Medication Dose Rate   03/15/17 0956 New Bag/Given   piperacillin-tazobactam (ZOSYN) IVPB 3.375 g 3.375 g 100 mL/hr   03/15/17 1131 New Bag/Given   vancomycin (VANCOCIN) 1,750 mg in sodium chloride 0.9 % 500 mL IVPB 1,750 mg 250 mL/hr      MEDICATIONS: . aspirin EC  81 mg Oral Daily  . bacitracin   Topical BID  . enoxaparin (LOVENOX) injection  40 mg Subcutaneous Q24H  . insulin aspart  0-9 Units Subcutaneous TID WC  . insulin aspart  0-9 Units Subcutaneous TID WC  . insulin glargine  8 Units Subcutaneous Daily  . montelukast  10 mg Oral Daily  . simvastatin  20 mg Oral QPM  . tamsulosin  0.4 mg Oral Daily  .  torsemide  60 mg Oral Daily  . [START ON 04/14/2017] Vitamin D (Ergocalciferol)  50,000 Units Oral Q30 days    Review of Systems - unable to obtain  OBJECTIVE: Temp:  [98 F (36.7 C)-102.7 F (39.3 C)] 98 F (36.7 C) (01/24 1253) Pulse Rate:  [44-106] 94 (01/24 1255) Resp:  [18-28] 22 (01/24 1255) BP: (91-132)/(57-71) 94/57 (01/24 1253) SpO2:  [92 %-100 %] 96 % (01/24 1255) Weight:  [93.9 kg (207 lb)-101.4 kg (223 lb 8 oz)] 101.4 kg (223 lb 8 oz) (01/24 1248) Physical Exam  Constitutional:disheveled, sleepy, able to open eyes to voice but not really communicative HENT: anicteric Mouth/Throat: Oropharynx is clear and moist. No oropharyngeal exudate.  Cardiovascular: Normal rate, regular rhythm and normal heart sounds. 2/6 sm  Pulmonary/Chest: poor air  movement Abdominal: Soft. Bowel sounds are normal. He exhibits no distension. There is no tenderness.  Lymphadenopathy: He has no cervical adenopathy.  Ext 2+ edema bil LE Neurological: confused Vascular - L foot is cool to touch and cyanotic appearing, no DP palpable through edema but able to doppler a DP pulse R foot 1+ DP pulse, foot distally is warm Skin: extensive ulceration R leg with ant upper shin with large shallow ulcer with whitish discoloration and a smaller wound below it with yellow exudate.. Around ankle medially there is extensive wound with black necrotic denuded tissue L ant shin with shallow ulceration Malodorous wound Psychiatric: altered    LABS: Results for orders placed or performed during the hospital encounter of 03/15/17 (from the past 48 hour(s))  Comprehensive metabolic panel     Status: Abnormal   Collection Time: 03/15/17  9:28 AM  Result Value Ref Range   Sodium 130 (L) 135 - 145 mmol/L   Potassium 5.2 (H) 3.5 - 5.1 mmol/L   Chloride 95 (L) 101 - 111 mmol/L   CO2 22 22 - 32 mmol/L   Glucose, Bld 258 (H) 65 - 99 mg/dL   BUN 99 (H) 6 - 20 mg/dL   Creatinine, Ser 1.95 (H) 0.61 - 1.24 mg/dL   Calcium 8.6 (L) 8.9 - 10.3 mg/dL   Total Protein 7.2 6.5 - 8.1 g/dL   Albumin 2.4 (L) 3.5 - 5.0 g/dL   AST 64 (H) 15 - 41 U/L   ALT 61 17 - 63 U/L   Alkaline Phosphatase 148 (H) 38 - 126 U/L   Total Bilirubin 3.5 (H) 0.3 - 1.2 mg/dL   GFR calc non Af Amer 34 (L) >60 mL/min   GFR calc Af Amer 39 (L) >60 mL/min    Comment: (NOTE) The eGFR has been calculated using the CKD EPI equation. This calculation has not been validated in all clinical situations. eGFR's persistently <60 mL/min signify possible Chronic Kidney Disease.    Anion gap 13 5 - 15    Comment: Performed at [facility], [address] [place], [place] [address]  Lactic acid, plasma     Status: Abnormal   Collection Time: 03/15/17  9:28 AM  Result Value Ref Range   Lactic Acid, Venous  3.7 (HH) 0.5 - 1.9 mmol/L    Comment: CRITICAL RESULT CALLED TO, READ BACK BY AND VERIFIED WITH  VALERIE CHANDLER AT 1008 03/15/17 SDR Performed at [facility], [address] [place], [place] [address]   CBC with Differential     Status: Abnormal   Collection Time: 03/15/17  9:28 AM  Result Value Ref Range   WBC 13.8 (H) 3.8 - 10.6 K/uL  RBC 4.77 4.40 - 5.90 MIL/uL   Hemoglobin 15.6 13.0 - 18.0 g/dL   HCT 46.8 40.0 - 52.0 %   MCV 98.1 80.0 - 100.0 fL   MCH 32.7 26.0 - 34.0 pg   MCHC 33.4 32.0 - 36.0 g/dL   RDW 17.1 (H) 11.5 - 14.5 %   Platelets 150 150 - 440 K/uL   Neutrophils Relative % 89 %   Neutro Abs 12.3 (H) 1.4 - 6.5 K/uL   Lymphocytes Relative 7 %   Lymphs Abs 0.9 (L) 1.0 - 3.6 K/uL   Monocytes Relative 3 %   Monocytes Absolute 0.4 0.2 - 1.0 K/uL   Eosinophils Relative 1 %   Eosinophils Absolute 0.1 0 - 0.7 K/uL   Basophils Relative 0 %   Basophils Absolute 0.0 0 - 0.1 K/uL    Comment: Performed at [facility], [address], [place], [place] [address]  Protime-INR     Status: Abnormal   Collection Time: 03/15/17  9:28 AM  Result Value Ref Range   Prothrombin Time 17.1 (H) 11.4 - 15.2 seconds   INR 1.41     Comment: Performed at [facility], [address] [place], [place] [address]  Troponin I     Status: Abnormal   Collection Time: 03/15/17  9:28 AM  Result Value Ref Range   Troponin I 0.05 (HH) <0.03 ng/mL    Comment: CRITICAL RESULT CALLED TO, READ BACK BY AND VERIFIED WITH VALERIE CHANDLER AT 1055 03/15/17 DAS Performed at [facility], [address] [place], [place] [address]   APTT     Status: None   Collection Time: 03/15/17  9:28 AM  Result Value Ref Range   aPTT 34 24 - 36 seconds    Comment: Performed at [facility], [address] [place], [place] [address]  Brain natriuretic peptide     Status: Abnormal   Collection Time: 03/15/17  9:28 AM  Result Value Ref Range   B Natriuretic Peptide  1,560.0 (H) 0.0 - 100.0 pg/mL    Comment: Performed at [facility], [address] [place], [place] [address]  Sedimentation rate     Status: Abnormal   Collection Time: 03/15/17  9:28 AM  Result Value Ref Range   Sed Rate 31 (H) 0 - 20 mm/hr    Comment: Performed at [facility], [address] [place], [place] [address]  Urinalysis, Complete w Microscopic     Status: Abnormal   Collection Time: 03/15/17  9:29 AM  Result Value Ref Range   Color, Urine AMBER (A) YELLOW    Comment: BIOCHEMICALS MAY BE AFFECTED BY COLOR   APPearance CLOUDY (A) CLEAR   Specific Gravity, Urine 1.011 1.005 - 1.030   pH 5.0 5.0 - 8.0   Glucose, UA NEGATIVE NEGATIVE mg/dL   Hgb urine dipstick SMALL (A) NEGATIVE   Bilirubin Urine NEGATIVE NEGATIVE   Ketones, ur NEGATIVE NEGATIVE mg/dL   Protein, ur NEGATIVE NEGATIVE mg/dL   Nitrite NEGATIVE NEGATIVE   Leukocytes, UA LARGE (A) NEGATIVE   RBC / HPF 0-5 0 - 5 RBC/hpf   WBC, UA TOO NUMEROUS TO COUNT 0 - 5 WBC/hpf   Bacteria, UA MANY (A) NONE SEEN   Squamous Epithelial / LPF NONE SEEN NONE SEEN    Comment: Performed at [facility], [address] [place], [place] [address]  Blood gas, venous ([facility], [facility], [facility])     Status: Abnormal   Collection Time: 03/15/17  9:46 AM  Result Value Ref Range   pH, Ven 7.44 (H) 7.250 - 7.430   pCO2, Ven 32 (L) 44.0 - 60.0 mmHg   pO2, Ven 33.0 32.0 - 45.0 mmHg   Bicarbonate 21.7 20.0 - 28.0 mmol/L   Acid-base deficit 1.5 0.0 - 2.0 mmol/L   O2 Saturation 66.5 %   Patient temperature 37.0    Collection site VEIN    Sample type VENIPUNCTURE     Comment: Performed at [facility], [address] [place], [place] [address]  Type and screen [place]     Status: None   Collection Time: 03/15/17  9:54 AM  Result Value Ref Range   ABO/RH(D) O POS    Antibody Screen NEG    Sample Expiration      03/18/2017 Performed at [facility], [address]  [place], [place] [address]   Ammonia     Status: None   Collection Time: 03/15/17  9:54 AM  Result Value Ref Range   Ammonia 22 9 - 35 umol/L    Comment: Performed at [facility], [place], [place] [address]  Lactic acid, plasma     Status: Abnormal   Collection Time: 03/15/17 11:15 AM  Result Value Ref Range   Lactic Acid, Venous 3.9 (HH) 0.5 - 1.9 mmol/L    Comment: CRITICAL RESULT CALLED TO, READ BACK BY AND VERIFIED WITH KATE BUMGARNER AT 1213 03/15/17 DAS Performed at [facility], [address] [place], [place] [address]   Glucose, capillary     Status: Abnormal   Collection Time: 03/15/17 11:20 AM  Result Value Ref Range   Glucose-Capillary 214 (H) 65 - 99 mg/dL   No components found for: ESR, C REACTIVE PROTEIN MICRO: No results found for this or any previous visit (from the past 720 hour(s)).  IMAGING: Dg Tibia/fibula Left  Result Date: 03/15/2017 CLINICAL DATA:  Altered mental status.  Soft tissue infection. EXAM: LEFT TIBIA AND FIBULA - 2 VIEW COMPARISON:  None. FINDINGS: Evidence of fracture or osteomyelitis. There is soft tissue swelling. There are arterial and venous calcifications. IMPRESSION: No bone abnormality seen.  Soft tissue swelling. Electronically Signed   By: Nelson Chimes M.D.   On: 03/15/2017 10:32   Dg Tibia/fibula Right  Result Date: 03/15/2017 CLINICAL DATA:  Oozing wounds on the right lower leg of uncertain chronicity. No known injury. EXAM: RIGHT TIBIA AND FIBULA - 2 VIEW COMPARISON:  None. FINDINGS: There is infiltration of subcutaneous fat about the right lower leg compatible with cellulitis or dependent change. No soft tissue gas or radiopaque foreign body is identified. No bony destructive change or periosteal reaction. No fracture or dislocation. Advanced medial compartment osteoarthritis right knee noted. IMPRESSION: Findings compatible with cellulitis and/or dependent change in the right lower leg. Negative for soft  tissue gas or evidence of osteomyelitis. Right knee osteoarthritis appears advanced in the medial compartment. Electronically Signed   By: Inge Rise M.D.   On: 03/15/2017 10:33   Dg Chest Portable 1 View  Result Date: 03/15/2017 CLINICAL DATA:  Fever and altered mental status. PICC line. Cellulitis. EXAM: PORTABLE CHEST 1 VIEW COMPARISON:  02/09/2017 FINDINGS: Chronic cardiomegaly. Right arm PICC tip in the SVC 3 cm above the right atrium. The lungs are clear. No edema or effusions. No significant bone finding. IMPRESSION: Chronic cardiomegaly.  No active disease.  Right arm PICC. Electronically Signed   By: Nelson Chimes M.D.   On:  03/15/2017 10:31    12.26  TEE  - Aortic valve: There was trivial regurgitation. - Mitral valve: There was a vegetation. The findings are consistent   with moderate to severe stenosis. There was moderate to severe   regurgitation. - Left atrium: The atrium was dilated. No evidence of thrombus in   the appendage. - Right atrium: The atrium was dilated. - Atrial septum: Echo contrast study showed no right-to-left atrial   level shunt, at baseline or with provocation. Echo contrast study   showed no right-to-left atrial level shunt, at baseline or with   provocation.  Impressions:  - Mitral regurgitation, consistent with endocarditis. There was a   vegetation, consistent with endocarditis.  Assessment:   AMANDO CHAPUT is a 67 y.o. male with recent admission when he was found to have MSSA and CNS bacteremia with TEE showing MV veg, mod to severe MS and mod to severe MR.   Now with recurrent fevers and sepsis despite being on IV vancomycin as well as worsening of LE wounds. I suspect his wounds are source of infection and will need further evaluation for debridement and also for possible vascular intervention. His L foot is very cool and cyanotic but has a DP pulse by doppler. He also has AFR with BUN 99 and cr 1.95. On otpt labs from 1/8 his Bun was 40 and  cr 1.21. He has severe ulceration of R leg and mod of L leg as well as a sacral wound.  Xray does not show any bony involvement nor soft tissue air  I discussed with his daughter that he is quite ill and may require surgery and or amputation of his LE.  She states he has had a marked decline in the last few months and she has had to start taking care of him more and more.   Recommendations Cultures of wound and blood pending Cont zosyn  Given ARF change vanco to daptomycin If bacteremic will need to have PICC removed but can leave in place for now unless he decompensates. Vascular consult is pending. May need cardiology help with his CHF and severe MS/MR.  He needs adequate control of his edema but also has ARF as well. Monitor Cr and UOP given ARF. Plan discussed with daughter   Thank you very much for allowing me to participate in the care of this patient. Please call with questions.   Cheral Marker. Ola Spurr, MD

## 2017-03-15 NOTE — ED Notes (Signed)
Pt from [facility] for fever.  Pt arrived febrile here. Will open eyes and respond to voice but will not really answer questions. Drowsy.  Multiple open wounds under dressings that were applied to legs.  PICC line to RUE on arrival. Small open wound to right neck area. RLE red in color. Toes dark. Doppler DP pulses present bilateral.

## 2017-03-15 NOTE — Progress Notes (Signed)
CODE SEPSIS - PHARMACY COMMUNICATION  **Broad Spectrum Antibiotics should be administered within 1 hour of Sepsis diagnosis**  Time Code Sepsis Called/Page Received: [id number]  Antibiotics Ordered: Zosyn  Time of 1st antibiotic administration: [phone]  Additional action taken by pharmacy: will continue to monitor for additional antibiotic orders.   If necessary, Name of Provider/Nurse Contacted: N/A   Simpson,Michael L ,PharmD Clinical Pharmacist  03/15/2017  10:01 AM

## 2017-03-15 NOTE — NC FL2 (Signed)
[place] MEDICAID FL2 LEVEL OF CARE SCREENING TOOL     IDENTIFICATION  Patient Name: Jeremy Gutierrez Birthdate: 09/12/1950 Sex: male Admission Date (Current Location): 03/15/2017  [place]   Facility and Address:  [facility], [address], [place], [place] [address]      Provider Number: [id number]  Attending Physician Name and Address:  Adrian SaranMody, Sital, MD  Relative Name and Phone Number:       Current Level of Care: Hospital Recommended Level of Care: Skilled Nursing Facility Prior Approval Number:    Date Approved/Denied:   PASRR Number: ([id number])  Discharge Plan: SNF    Current Diagnoses: Patient Active Problem List   Diagnosis Date Noted  . Dementia 02/15/2017  . Pressure injury of skin 02/13/2017  . MRSA bacteremia 02/11/2017  . Sepsis (HCC) 02/09/2017  . Tobacco use disorder 01/23/2017  . Hyperglycemia 04/02/2016  . Essential hypertension, benign 02/29/2016  . AAA (abdominal aortic aneurysm) without rupture (HCC) 02/29/2016  . Diabetes (HCC) 02/29/2016  . Abscess of right thigh 10/29/2012    Orientation RESPIRATION BLADDER Height & Weight    Self, Place    Normal Continent Weight: 223 lb 8 oz (101.4 kg) Height:  5\' 11"  (180.3 cm)  BEHAVIORAL SYMPTOMS/MOOD NEUROLOGICAL BOWEL NUTRITION STATUS      Continent Diet(NPO to be advanced)  AMBULATORY STATUS COMMUNICATION OF NEEDS Skin   Extensive Assist Verbally Normal                       Personal Care Assistance Level of Assistance  Bathing, Feeding, Dressing Bathing Assistance: Limited assistance Feeding assistance: Independent Dressing Assistance: Limited assistance     Functional Limitations Info  Sight, Hearing, Speech Sight Info: Adequate Hearing Info: Adequate Speech Info: Adequate    SPECIAL CARE FACTORS FREQUENCY  PT (By licensed PT), OT (By licensed OT)([place] IV antibiotics)     PT Frequency: (5) OT Frequency: (5)             Contractures      Additional Factors Info  Code Status, Allergies, Isolation Precautions Code Status Info: (Full Code) Allergies Info: (SULFA ANTIBIOTICS)     Isolation Precautions Info: (Nasal Swab)     Current Medications (03/15/2017):  This is the current hospital active medication list Current Facility-Administered Medications  Medication Dose Route Frequency Provider Last Rate Last Dose  . acetaminophen (TYLENOL) tablet 650 mg  650 mg Oral Q6H PRN Adrian SaranMody, Sital, MD       Or  . acetaminophen (TYLENOL) suppository 650 mg  650 mg Rectal Q6H PRN Mody, Sital, MD      . aspirin EC tablet 81 mg  81 mg Oral Daily Mody, Sital, MD      . bacitracin ointment   Topical BID Rockne MenghiniNorman, Anne-Caroline, MD   1 application at 03/15/17 1010  . bisacodyl (DULCOLAX) EC tablet 5 mg  5 mg Oral Daily PRN Mody, Sital, MD      . enoxaparin (LOVENOX) injection 40 mg  40 mg Subcutaneous Q24H Mody, Sital, MD      . insulin aspart (novoLOG) injection 0-9 Units  0-9 Units Subcutaneous TID WC Mody, Sital, MD      . insulin aspart (novoLOG) injection 0-9 Units  0-9 Units Subcutaneous TID WC Adrian SaranMody, Sital, MD   3 Units at 03/15/17 1224  . insulin glargine (LANTUS) injection 8 Units  8 Units Subcutaneous Daily Adrian SaranMody, Sital, MD   8 Units at 03/15/17  1217  . montelukast (SINGULAIR) tablet 10 mg  10 mg Oral Daily Mody, Sital, MD      . ondansetron (ZOFRAN) tablet 4 mg  4 mg Oral Q6H PRN Juliene Pina, Sital, MD       Or  . ondansetron (ZOFRAN) injection 4 mg  4 mg Intravenous Q6H PRN Mody, Sital, MD      . piperacillin-tazobactam (ZOSYN) IVPB 3.375 g  3.375 g Intravenous Q8H Mody, Sital, MD      . polyethylene glycol (MIRALAX / GLYCOLAX) packet 17 g  17 g Oral Daily PRN Mody, Sital, MD      . simvastatin (ZOCOR) tablet 20 mg  20 mg Oral QPM Mody, Sital, MD      . tamsulosin (FLOMAX) capsule 0.4 mg  0.4 mg Oral Daily Mody, Sital, MD      . torsemide (DEMADEX) tablet 60 mg  60 mg Oral Daily Mody, Sital, MD      . traMADol  (ULTRAM) tablet 50 mg  50 mg Oral Q6H PRN Adrian Saran, MD      . Melene Muller ON 03/16/2017] vancomycin (VANCOCIN) 1,250 mg in sodium chloride 0.9 % 250 mL IVPB  1,250 mg Intravenous Q24H Adrian Saran, MD      . Melene Muller ON 04/14/2017] Vitamin D (Ergocalciferol) (DRISDOL) capsule 50,000 Units  50,000 Units Oral Q30 days Adrian Saran, MD         Discharge Medications: Please see discharge summary for a list of discharge medications.  Relevant Imaging Results:  Relevant Lab Results:   Additional Information (SSN: [id number])  Payton Spark, Student-Social Work

## 2017-03-16 DIAGNOSIS — L03119 Cellulitis of unspecified part of limb: Secondary | ICD-10-CM

## 2017-03-16 DIAGNOSIS — I714 Abdominal aortic aneurysm, without rupture: Secondary | ICD-10-CM

## 2017-03-16 LAB — BLOOD CULTURE ID PANEL (REFLEXED)
Acinetobacter baumannii: NOT DETECTED
CANDIDA KRUSEI: NOT DETECTED
CARBAPENEM RESISTANCE: NOT DETECTED
Candida albicans: NOT DETECTED
Candida glabrata: NOT DETECTED
Candida parapsilosis: NOT DETECTED
Candida tropicalis: NOT DETECTED
Enterobacter cloacae complex: NOT DETECTED
Enterobacteriaceae species: DETECTED — AB
Enterococcus species: NOT DETECTED
Escherichia coli: NOT DETECTED
Haemophilus influenzae: NOT DETECTED
KLEBSIELLA OXYTOCA: DETECTED — AB
KLEBSIELLA PNEUMONIAE: NOT DETECTED
LISTERIA MONOCYTOGENES: NOT DETECTED
Methicillin resistance: NOT DETECTED
Neisseria meningitidis: NOT DETECTED
Proteus species: NOT DETECTED
Pseudomonas aeruginosa: NOT DETECTED
SERRATIA MARCESCENS: NOT DETECTED
STAPHYLOCOCCUS SPECIES: NOT DETECTED
STREPTOCOCCUS AGALACTIAE: NOT DETECTED
Staphylococcus aureus (BCID): NOT DETECTED
Streptococcus pneumoniae: NOT DETECTED
Streptococcus pyogenes: NOT DETECTED
Streptococcus species: NOT DETECTED
Vancomycin resistance: NOT DETECTED

## 2017-03-16 LAB — BASIC METABOLIC PANEL
Anion gap: 12 (ref 5–15)
BUN: 89 mg/dL — AB (ref 6–20)
CHLORIDE: 103 mmol/L (ref 101–111)
CO2: 21 mmol/L — AB (ref 22–32)
CREATININE: 2 mg/dL — AB (ref 0.61–1.24)
Calcium: 8.2 mg/dL — ABNORMAL LOW (ref 8.9–10.3)
GFR calc Af Amer: 38 mL/min — ABNORMAL LOW (ref >60)
GFR calc non Af Amer: 33 mL/min — ABNORMAL LOW (ref >60)
GLUCOSE: 128 mg/dL — AB (ref 65–99)
Potassium: 4.5 mmol/L (ref 3.5–5.1)
SODIUM: 136 mmol/L (ref 135–145)

## 2017-03-16 LAB — CBC
HCT: 43.6 % (ref 40.0–52.0)
Hemoglobin: 14.4 g/dL (ref 13.0–18.0)
MCH: 32.4 pg (ref 26.0–34.0)
MCHC: 32.9 g/dL (ref 32.0–36.0)
MCV: 98.4 fL (ref 80.0–100.0)
PLATELETS: 154 10*3/uL (ref 150–440)
RBC: 4.43 MIL/uL (ref 4.40–5.90)
RDW: 17.8 % — AB (ref 11.5–14.5)
WBC: 16.2 10*3/uL — ABNORMAL HIGH (ref 3.8–10.6)

## 2017-03-16 LAB — GLUCOSE, CAPILLARY
GLUCOSE-CAPILLARY: 114 mg/dL — AB (ref 65–99)
GLUCOSE-CAPILLARY: 189 mg/dL — AB (ref 65–99)
Glucose-Capillary: 172 mg/dL — ABNORMAL HIGH (ref 65–99)
Glucose-Capillary: 224 mg/dL — ABNORMAL HIGH (ref 65–99)

## 2017-03-16 NOTE — Progress Notes (Signed)
Palliative Medicine Team  Due to high volume of referrals, there is a delay seeing this patient. PM[facility] not at [facility] over the weekend but will arrange goals of care with patient and family on Monday. Thank you for the opportunity to participate in the care of [name].  NO CHARGE  Vennie HomansMegan Avriel Kandel, FNP-C Palliative Medicine Team  Phone: [phone] Fax: [phone]

## 2017-03-16 NOTE — Progress Notes (Signed)
Pharmacy Antibiotic Note  Jeremy Gutierrez is a 67 y.o. male admitted on 03/15/2017 with MSSA and CoNS mecA positive endocarditis, newly diagnosed Klebsiella bacteremia.  Pharmacy has been consulted for piperacillin/tazobactam and daptomycin dosing.  Patient was on IV vancomycin PTA for endocarditis but presented to ED spiking fevers. Newly diagnosed Klebsiella bacteremia. ID following and d/c'd vancomycin and initiated daptomycin.  Plan: Continue piperacillin/tazobactam 3.375 g IV q8h EI  Continue daptomycin (8 mg/kg adjusted body weight) IV q24h Adjusted body weight = 85 kg Baseline CK 463 CK to be monitored at least weekly  Height: 5\' 11"  (180.3 cm) Weight: 220 lb 12.8 oz (100.2 kg) IBW/kg (Calculated) : 75.3  Temp (24hrs), Avg:97.8 F (36.6 C), Min:97.7 F (36.5 C), Max:97.8 F (36.6 C)  Recent Labs  Lab 03/15/17 0928 03/15/17 1115 03/16/17 0530  WBC 13.8*  --  16.2*  CREATININE 1.95*  --  2.00*  LATICACIDVEN 3.7* 3.9*  --     Estimated Creatinine Clearance: 42.7 mL/min (A) (by C-G formula based on SCr of 2 mg/dL (H)).    Allergies  Allergen Reactions  . Sulfa Antibiotics Hives    Patient states he is not allergic to this medication    Thank you for allowing pharmacy to be a part of this patient's care.  Cindi CarbonMary M Ryian Lynde, PharmD, BCPS Clinical Pharmacist 03/16/2017 2:28 PM
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
## 2017-03-16 NOTE — Progress Notes (Signed)
03/16/17  Spoke with Dr. Gilda CreaseSchnier regarding patient.  Patient is known to his practice and he has already evaluated the patient's bilateral lower extremity ulcerations.  No general surgery consult needed at this time.   [name], MD [facility]

## 2017-03-16 NOTE — Progress Notes (Signed)
Initial Nutrition Assessment  DOCUMENTATION CODES:   Obesity unspecified  INTERVENTION:  1. 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  Attempted to speak with patient but he was very sleepy, did not respond appropriately to any questions. Per chart, he exhibits a 10 pound/4.3% insignificant weight loss over 1 month. Will follow-up as possible.  Labs reviewed:  CBGs 175, 114, 201 BUN 89, Cr 2.00 BNP 1560, CK 463  Medications reviewed and include:  Insulin, Vitamin D    NUTRITION - FOCUSED PHYSICAL EXAM: Unable to assess, patient lethargic   Diet Order:  Diet heart healthy/carb modified Room service appropriate? Yes; Fluid consistency: Thin  EDUCATION NEEDS:   Not appropriate for education at this time  Skin:  Skin Assessment: Skin Integrity Issues: Skin Integrity Issues:: Other (Comment) Other: Venous stasis ulcers  Last BM:  PTA  Height:   Ht Readings from Last 1 Encounters:  03/15/17 5\' 11"  (1.803 m)    Weight:   Wt Readings from Last 1 Encounters:  03/16/17 220 lb 12.8 oz (100.2 kg)    Ideal Body Weight:  78.18 kg  BMI:  Body mass index is 30.8 kg/m.  Estimated Nutritional Needs:   Kcal:  2157-2337 calories (MSJ x.1.2-1.3)  Protein:  120-140 grams  Fluid:  Per MD  Dionne AnoWilliam M. Yehoshua Vitelli, MS, RD LDN Inpatient Clinical Dietitian Pager  [phone]

## 2017-03-16 NOTE — Plan of Care (Signed)
VSS, free of falls.  Lethargic during shift.  No s/s pain.  Bed in low position, bed alarm on.  Call bell within reach, WCTM.

## 2017-03-16 NOTE — Progress Notes (Signed)
PHARMACY - PHYSICIAN COMMUNICATION CRITICAL VALUE ALERT - BLOOD CULTURE IDENTIFICATION (BCID)  Jeremy Gutierrez is an 67 y.o. male [facility] with a chief complaint of   Assessment:  BCID 1/4 K oxytoca (include suspected source if known)  Name of physician (or Provider) Contacted: Sheryle Hailiamond  Current antibiotics: Daptomycin, Zosyn  Changes to prescribed antibiotics recommended:  abx per ID  Results for orders placed or performed during the hospital encounter of 03/15/17  Blood Culture ID Panel (Reflexed) (Collected: 03/15/2017  9:29 AM)  Result Value Ref Range   Enterococcus species NOT DETECTED NOT DETECTED   Vancomycin resistance NOT DETECTED NOT DETECTED   Listeria monocytogenes NOT DETECTED NOT DETECTED   Staphylococcus species NOT DETECTED NOT DETECTED   Staphylococcus aureus NOT DETECTED NOT DETECTED   Methicillin resistance NOT DETECTED NOT DETECTED   Streptococcus species NOT DETECTED NOT DETECTED   Streptococcus agalactiae NOT DETECTED NOT DETECTED   Streptococcus pneumoniae NOT DETECTED NOT DETECTED   Streptococcus pyogenes NOT DETECTED NOT DETECTED   Acinetobacter baumannii NOT DETECTED NOT DETECTED   Enterobacteriaceae species DETECTED (A) NOT DETECTED   Enterobacter cloacae complex NOT DETECTED NOT DETECTED   Escherichia coli NOT DETECTED NOT DETECTED   Klebsiella oxytoca DETECTED (A) NOT DETECTED   Klebsiella pneumoniae NOT DETECTED NOT DETECTED   Proteus species NOT DETECTED NOT DETECTED   Serratia marcescens NOT DETECTED NOT DETECTED   Carbapenem resistance NOT DETECTED NOT DETECTED   Haemophilus influenzae NOT DETECTED NOT DETECTED   Neisseria meningitidis NOT DETECTED NOT DETECTED   Pseudomonas aeruginosa NOT DETECTED NOT DETECTED   Candida albicans NOT DETECTED NOT DETECTED   Candida glabrata NOT DETECTED NOT DETECTED   Candida krusei NOT DETECTED NOT DETECTED   Candida parapsilosis NOT DETECTED NOT DETECTED   Candida tropicalis  NOT DETECTED NOT DETECTED    [name] 03/16/2017  4:23 AM
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
## 2017-03-16 NOTE — Progress Notes (Signed)
Inpatient Diabetes Program Recommendations  AACE/ADA: New Consensus Statement on Inpatient Glycemic Control (2015)  Target Ranges:  Prepandial:   less than 140 mg/dL      Peak postprandial:   less than 180 mg/dL (1-2 hours)      Critically ill patients:  140 - 180 mg/dL   Lab Results  Component Value Date   GLUCAP 114 (H) 03/16/2017   HGBA1C 9.8 (H) 02/09/2017    Review of Glycemic Control  Results for Merideth AbbeyLSTON, Adrick L (MRN [id number]) as of 03/16/2017 09:55  Ref. Range 03/15/2017 11:20 03/15/2017 16:54 03/16/2017 07:34  Glucose-Capillary Latest Ref Range: 65 - 99 mg/dL 355214 (H) 732201 (H) 202114 (H)     Diabetes history: Type 2 Outpatient Diabetes medications: Humalog 75/25 15 units bid Current orders for Inpatient glycemic control: Lantus 8 units qday, Novolog 0-9 units tid  Inpatient Diabetes Program Recommendations: Agree with current medications for blood sugar management.   If patient is eating and if post prandial blood sugars begin to rise and are consistently greater than 180 mg/dl, consider adding Novolog 2-4 units tid with meals (hold if patient eats less than 50%)  Susette RacerJulie Annetta Deiss, RN, BA, [place]HA, CDE Diabetes Coordinator Inpatient Diabetes Program  [phone] (Team Pager) [phone] [facility] Office) 03/16/2017 10:03 AM

## 2017-03-16 NOTE — Progress Notes (Signed)
[facility] CLINIC INFECTIOUS DISEASE PROGRESS NOTE Date of Admission:  03/15/2017     ID: Jeremy Gutierrez is a 67 y.o. male with sepsis   Active Problems:   Sepsis (HCC)   Subjective: Remains confused, fevers improving but wbc elevated.   ROS  Unable to obtain  Medications:  Antibiotics Given (last 72 hours)    Date/Time Action Medication Dose Rate   03/15/17 0956 New Bag/Given   piperacillin-tazobactam (ZOSYN) IVPB 3.375 g 3.375 g 100 mL/hr   03/15/17 1131 New Bag/Given   vancomycin (VANCOCIN) 1,750 mg in sodium chloride 0.9 % 500 mL IVPB 1,750 mg 250 mL/hr   03/15/17 1751 New Bag/Given   piperacillin-tazobactam (ZOSYN) IVPB 3.375 g 3.375 g 12.5 mL/hr   03/15/17 1751 New Bag/Given   DAPTOmycin (CUBICIN) 685.5 mg in sodium chloride 0.9 % IVPB 685.5 mg 227.4 mL/hr   03/16/17 0100 New Bag/Given   piperacillin-tazobactam (ZOSYN) IVPB 3.375 g 3.375 g 12.5 mL/hr   03/16/17 0836 New Bag/Given   piperacillin-tazobactam (ZOSYN) IVPB 3.375 g 3.375 g 12.5 mL/hr   03/16/17 1558 New Bag/Given   piperacillin-tazobactam (ZOSYN) IVPB 3.375 g 3.375 g 12.5 mL/hr     . aspirin EC  81 mg Oral Daily  . bacitracin   Topical BID  . bacitracin   Topical Daily  . enoxaparin (LOVENOX) injection  40 mg Subcutaneous Q24H  . insulin aspart  0-9 Units Subcutaneous TID WC  . insulin glargine  8 Units Subcutaneous Daily  . montelukast  10 mg Oral Daily  . tamsulosin  0.4 mg Oral Daily  . torsemide  60 mg Oral Daily  . [START ON 04/14/2017] Vitamin D (Ergocalciferol)  50,000 Units Oral Q30 days    Objective: Vital signs in last 24 hours: Temp:  [97.7 F (36.5 C)-97.8 F (36.6 C)] 97.8 F (36.6 C) (01/25 0445) Pulse Rate:  [95-108] 95 (01/25 0445) Resp:  [20] 20 (01/25 0445) BP: (113-121)/(59-72) 113/59 (01/25 0445) SpO2:  [94 %-98 %] 98 % (01/25 0445) Weight:  [100.2 kg (220 lb 12.8 oz)] 100.2 kg (220 lb 12.8 oz) (01/25 0445) Constitutional:disheveled, sleepy, able to open eyes to voice but not  really communicative HENT: anicteric Mouth/Throat: Oropharynx is clear and moist. No oropharyngeal exudate.  Cardiovascular: Normal rate, regular rhythm and normal heart sounds. 2/6 sm  Pulmonary/Chest: poor air movement Abdominal: Soft. Bowel sounds are normal. He exhibits no distension. There is no tenderness.  Lymphadenopathy: He has no cervical adenopathy.  Ext 2+ edema bil LE Neurological: confused Vascular - L foot is cool to touch and cyanotic appearing, no DP palpable through edema but able to doppler a DP pulse R foot 1+ DP pulse, foot distally is warm Skin: extensive ulceration R leg with ant upper shin with large shallow ulcer with whitish discoloration and a smaller wound below it with yellow exudate.. Around ankle medially there is extensive wound with black necrotic denuded tissue L ant shin with shallow ulceration Malodorous wound Psychiatric: altered   Lab Results Recent Labs    03/15/17 0928 03/16/17 0530  WBC 13.8* 16.2*  HGB 15.6 14.4  HCT 46.8 43.6  NA 130* 136  K 5.2* 4.5  CL 95* 103  CO2 22 21*  BUN 99* 89*  CREATININE 1.95* 2.00*    Microbiology: Results for orders placed or performed during the hospital encounter of 03/15/17  Culture, blood (Routine x 2)     Status: None (Preliminary result)   Collection Time: 03/15/17  9:29 AM  Result Value Ref Range  Status   Specimen Description BLOOD LEFT FA  Final   Special Requests   Final    BOTTLES DRAWN AEROBIC AND ANAEROBIC Blood Culture adequate volume   Culture  Setup Time   Final    GRAM NEGATIVE RODS IN BOTH AEROBIC AND ANAEROBIC BOTTLES CRITICAL RESULT CALLED TO, READ BACK BY AND VERIFIED WITH: MATT MCBANE AT 0307 ON 03/16/17 [facility]. Performed at [facility], [address] [place], [place] [address]    Culture GRAM NEGATIVE RODS  Final   Report Status PENDING  Incomplete  Culture, blood (Routine x 2)     Status: None (Preliminary result)   Collection Time: 03/15/17  9:29 AM  Result  Value Ref Range Status   Specimen Description BLOOD RIGHT [facility]  Final   Special Requests   Final    BOTTLES DRAWN AEROBIC AND ANAEROBIC Blood Culture results may not be optimal due to an excessive volume of blood received in culture bottles   Culture  Setup Time   Final    GRAM NEGATIVE RODS ANAEROBIC BOTTLE ONLY CRITICAL VALUE NOTED.  VALUE IS CONSISTENT WITH PREVIOUSLY REPORTED AND CALLED VALUE. Performed at [facility], [address] [place], [place] [address]    Culture GRAM NEGATIVE RODS  Final   Report Status PENDING  Incomplete  Blood Culture ID Panel (Reflexed)     Status: Abnormal   Collection Time: 03/15/17  9:29 AM  Result Value Ref Range Status   Enterococcus species NOT DETECTED NOT DETECTED Final   Vancomycin resistance NOT DETECTED NOT DETECTED Final   Listeria monocytogenes NOT DETECTED NOT DETECTED Final   Staphylococcus species NOT DETECTED NOT DETECTED Final   Staphylococcus aureus NOT DETECTED NOT DETECTED Final   Methicillin resistance NOT DETECTED NOT DETECTED Final   Streptococcus species NOT DETECTED NOT DETECTED Final   Streptococcus agalactiae NOT DETECTED NOT DETECTED Final   Streptococcus pneumoniae NOT DETECTED NOT DETECTED Final   Streptococcus pyogenes NOT DETECTED NOT DETECTED Final   Acinetobacter baumannii NOT DETECTED NOT DETECTED Final   Enterobacteriaceae species DETECTED (A) NOT DETECTED Final    Comment: CRITICAL RESULT CALLED TO, READ BACK BY AND VERIFIED WITH: MATT MCBANE AT 0307 ON 03/16/17 MMC. Enterobacteriaceae represent a large family of gram-negative bacteria, not a single organism.    Enterobacter cloacae complex NOT DETECTED NOT DETECTED Final   Escherichia coli NOT DETECTED NOT DETECTED Final   Klebsiella oxytoca DETECTED (A) NOT DETECTED Final    Comment: CRITICAL RESULT CALLED TO, READ BACK BY AND VERIFIED WITH: MATT MCBANE AT 0307 ON 03/16/17 MMC.    Klebsiella pneumoniae NOT DETECTED NOT DETECTED Final   Proteus  species NOT DETECTED NOT DETECTED Final   Serratia marcescens NOT DETECTED NOT DETECTED Final   Carbapenem resistance NOT DETECTED NOT DETECTED Final   Haemophilus influenzae NOT DETECTED NOT DETECTED Final   Neisseria meningitidis NOT DETECTED NOT DETECTED Final   Pseudomonas aeruginosa NOT DETECTED NOT DETECTED Final   Candida albicans NOT DETECTED NOT DETECTED Final   Candida glabrata NOT DETECTED NOT DETECTED Final   Candida krusei NOT DETECTED NOT DETECTED Final   Candida parapsilosis NOT DETECTED NOT DETECTED Final   Candida tropicalis NOT DETECTED NOT DETECTED Final    Comment: Performed at [facility], [address] [place], [place] [address]  Aerobic/Anaerobic Culture (surgical/deep wound)     Status: None (Preliminary result)   Collection Time: 03/15/17 11:15 AM  Result Value Ref Range Status   Specimen Description  Final    LEG Performed at [facility], [address] [place], [place] [address]    Special Requests   Final    NONE Performed at [facility], [address] [place], [place] [address]    Gram Stain   Final    RARE WBC PRESENT, PREDOMINANTLY PMN ABUNDANT GRAM NEGATIVE RODS RARE GRAM POSITIVE COCCI Performed at [facility], [address] [place], [place] [address]    Culture ABUNDANT KLEBSIELLA OXYTOCA  Final   Report Status PENDING  Incomplete  MRSA PCR Screening     Status: None   Collection Time: 03/15/17  6:30 PM  Result Value Ref Range Status   MRSA by PCR NEGATIVE NEGATIVE Final    Comment:        The GeneXpert MRSA Assay (FDA approved for NASAL specimens only), is one component of a comprehensive MRSA colonization surveillance program. It is not intended to diagnose MRSA infection nor to guide or monitor treatment for MRSA infections. Performed at [facility], [address] [place], [place] [address]     Studies/Results: Dg Tibia/fibula Left  Result Date: 03/15/2017 CLINICAL  DATA:  Altered mental status.  Soft tissue infection. EXAM: LEFT TIBIA AND FIBULA - 2 VIEW COMPARISON:  None. FINDINGS: Evidence of fracture or osteomyelitis. There is soft tissue swelling. There are arterial and venous calcifications. IMPRESSION: No bone abnormality seen.  Soft tissue swelling. Electronically Signed   By: Paulina FusiMark  Shogry M.D.   On: 03/15/2017 10:32   Dg Tibia/fibula Right  Result Date: 03/15/2017 CLINICAL DATA:  Oozing wounds on the right lower leg of uncertain chronicity. No known injury. EXAM: RIGHT TIBIA AND FIBULA - 2 VIEW COMPARISON:  None. FINDINGS: There is infiltration of subcutaneous fat about the right lower leg compatible with cellulitis or dependent change. No soft tissue gas or radiopaque foreign body is identified. No bony destructive change or periosteal reaction. No fracture or dislocation. Advanced medial compartment osteoarthritis right knee noted. IMPRESSION: Findings compatible with cellulitis and/or dependent change in the right lower leg. Negative for soft tissue gas or evidence of osteomyelitis. Right knee osteoarthritis appears advanced in the medial compartment. Electronically Signed   By: Drusilla Kannerhomas  Dalessio M.D.   On: 03/15/2017 10:33   Mr Tibia Fibula Right Wo Contrast  Result Date: 03/15/2017 CLINICAL DATA:  Chronic lower extremity ulcerations and wounds. EXAM: MRI OF LOWER RIGHT EXTREMITY WITHOUT CONTRAST TECHNIQUE: Multiplanar, multisequence MR imaging of both legs was performed. No intravenous contrast was administered. COMPARISON:  None. FINDINGS: Bones/Joint/Cartilage No acute fracture or suspicious osseous lesions. Probable small scattered cartilaginous rests of the right tibial diaphysis. Nonspecific mild marrow edema of the right mid and hindfoot predominantly involving what appears be the plantar anterior aspect of the calcaneus and navicular as well as anterior aspect of the left calcaneus. These are incompletely imaged. Findings may represent reactive  edema. Possibility of osteomyelitis is not entirely excluded. Ligaments Noncontributory Muscles and Tendons No atrophy.  No evidence of pyomyositis. Soft tissues Diffuse soft tissue edema of both legs and included feet, left greater than right. No drainable fluid collections or abscess. IMPRESSION: Nonspecific marrow edema of the calcanei and right navicular. In the presence of diffuse soft tissue edema and possible cellulitis, osteomyelitis is not entirely excluded. Findings may simply reflect reactive marrow edema or stigmata of recent trauma among some other considerations. Dedicated MRI of the feet may help for better assessment however as clinically necessary. Diffuse soft tissue edema of both legs  likely representing either cellulitis, third spacing of fluid, stigmata of venous insufficiency or combination thereof. No drainable fluid collections. No evidence of abscess or pyomyositis. Electronically Signed   By: Tollie Eth M.D.   On: 03/15/2017 21:40   Dg Chest Portable 1 View  Result Date: 03/15/2017 CLINICAL DATA:  Fever and altered mental status. PICC line. Cellulitis. EXAM: PORTABLE CHEST 1 VIEW COMPARISON:  02/09/2017 FINDINGS: Chronic cardiomegaly. Right arm PICC tip in the SVC 3 cm above the right atrium. The lungs are clear. No edema or effusions. No significant bone finding. IMPRESSION: Chronic cardiomegaly.  No active disease.  Right arm PICC. Electronically Signed   By: Paulina Fusi M.D.   On: 03/15/2017 10:31    Assessment/Plan: 12.26  TEE  - Aortic valve: There was trivial regurgitation. - Mitral valve: There was a vegetation. The findings are consistent with moderate to severe stenosis. There was moderate to severe regurgitation. - Left atrium: The atrium was dilated. No evidence of thrombus in the appendage. - Right atrium: The atrium was dilated. - Atrial septum: Echo contrast study showed no right-to-left atrial level shunt, at baseline or with provocation. Echo  contrast study showed no right-to-left atrial level shunt, at baseline or with provocation.  Impressions:  - Mitral regurgitation, consistent with endocarditis. There was a vegetation, consistent with endocarditis.  Assessment:   Jeremy Gutierrez is a 67 y.o. male with recent admission when he was found to have MSSA and CNS bacteremia with TEE showing MV veg, mod to severe MS and mod to severe MR.   Now with recurrent fevers and sepsis despite being on IV vancomycin as well as worsening of LE wounds. I suspect his wounds are source of infection and will need further evaluation for debridement and also for possible vascular intervention. His L foot is very cool and cyanotic but has a DP pulse by doppler. He also has ARF with BUN 99 and cr 1.95. On otpt labs from 1/8 his Bun was 40 and cr 1.21. He has severe ulceration of R leg and mod of L leg as well as a sacral wound.  Xray does not show any bony involvement nor soft tissue air  1.25 - BCX Klebsiella as well as wound cx. Fever down but wbc increased some.  Seen by vascular and not able to tolerate  angiography.  Recommendations Cont vanco  Cont zosyn . May need cardiology help with his CHF and severe MS/MR.  He needs adequate control of his edema but also has ARF as well. Monitor Cr and UOP given ARF. Poor prognosis with endocarditis and now Gram negative sepsis.  Thank you very much for the consult. Will follow with you.  Mick Sell   03/16/2017, 4:54 PM

## 2017-03-17 LAB — BASIC METABOLIC PANEL
ANION GAP: 9 (ref 5–15)
BUN: 81 mg/dL — ABNORMAL HIGH (ref 6–20)
CHLORIDE: 99 mmol/L — AB (ref 101–111)
CO2: 24 mmol/L (ref 22–32)
Calcium: 7.8 mg/dL — ABNORMAL LOW (ref 8.9–10.3)
Creatinine, Ser: 1.72 mg/dL — ABNORMAL HIGH (ref 0.61–1.24)
GFR calc non Af Amer: 39 mL/min — ABNORMAL LOW (ref >60)
GFR, EST AFRICAN AMERICAN: 45 mL/min — AB (ref >60)
Glucose, Bld: 227 mg/dL — ABNORMAL HIGH (ref 65–99)
Potassium: 3.9 mmol/L (ref 3.5–5.1)
Sodium: 132 mmol/L — ABNORMAL LOW (ref 135–145)

## 2017-03-17 LAB — CBC
HEMATOCRIT: 43.5 % (ref 40.0–52.0)
HEMOGLOBIN: 14.2 g/dL (ref 13.0–18.0)
MCH: 32 pg (ref 26.0–34.0)
MCHC: 32.7 g/dL (ref 32.0–36.0)
MCV: 98 fL (ref 80.0–100.0)
Platelets: 153 10*3/uL (ref 150–440)
RBC: 4.44 MIL/uL (ref 4.40–5.90)
RDW: 17.6 % — ABNORMAL HIGH (ref 11.5–14.5)
WBC: 14.5 10*3/uL — ABNORMAL HIGH (ref 3.8–10.6)

## 2017-03-17 LAB — CK: Total CK: 109 U/L (ref 49–397)

## 2017-03-17 LAB — GLUCOSE, CAPILLARY
Glucose-Capillary: 210 mg/dL — ABNORMAL HIGH (ref 65–99)
Glucose-Capillary: 211 mg/dL — ABNORMAL HIGH (ref 65–99)
Glucose-Capillary: 164 mg/dL — ABNORMAL HIGH (ref 65–99)
Glucose-Capillary: 209 mg/dL — ABNORMAL HIGH (ref 65–99)

## 2017-03-17 MED ORDER — INSULIN ASPART 100 UNIT/ML ~~LOC~~ SOLN
0.0000 [IU] | Freq: Every day | SUBCUTANEOUS | Status: DC
  Administered 2017-03-17: 22:00:00 2 [IU] via SUBCUTANEOUS
  Filled 2017-03-17: qty 1

## 2017-03-17 MED ORDER — INSULIN ASPART 100 UNIT/ML ~~LOC~~ SOLN
4.0000 [IU] | Freq: Three times a day (TID) | SUBCUTANEOUS | Status: DC
  Administered 2017-03-18 – 2017-03-22 (×10): 4 [IU] via SUBCUTANEOUS
  Filled 2017-03-17 (×10): qty 1

## 2017-03-17 NOTE — Progress Notes (Signed)
Pharmacy Antibiotic Note  Jeremy Gutierrez is a 67 y.o. male admitted on 03/15/2017 with MSSA and CoNS mecA positive endocarditis, newly diagnosed Klebsiella bacteremia.  Pharmacy has been consulted for piperacillin/tazobactam and daptomycin dosing.  Patient was on IV vancomycin PTA for endocarditis but presented to ED spiking fevers. Newly diagnosed Klebsiella bacteremia. ID following and d/c'd vancomycin and initiated daptomycin.  Plan: Continue piperacillin/tazobactam 3.375 g IV q8h EI  Continue daptomycin (8 mg/kg adjusted body weight) IV q24h Adjusted body weight = 85 kg Baseline CK 463 CK to be monitored at least weekly   1/26: CK 109, trended down  Height: 5\' 11"  (180.3 cm) Weight: 217 lb (98.4 kg) IBW/kg (Calculated) : 75.3  Temp (24hrs), Avg:97.9 F (36.6 C), Min:97.9 F (36.6 C), Max:98 F (36.7 C)  Recent Labs  Lab 03/15/17 0928 03/15/17 1115 03/16/17 0530 03/17/17 0401  WBC 13.8*  --  16.2* 14.5*  CREATININE 1.95*  --  2.00* 1.72*  LATICACIDVEN 3.7* 3.9*  --   --     Estimated Creatinine Clearance: 49.1 mL/min (A) (by C-G formula based on SCr of 1.72 mg/dL (H)).    Allergies  Allergen Reactions  . Sulfa Antibiotics Hives    Patient states he is not allergic to this medication    Thank you for allowing pharmacy to be a part of this patient's care.  Marty HeckWang, Carney Saxton L, PharmD, BCPS Clinical Pharmacist 03/17/2017 1:11 PM

## 2017-03-17 NOTE — Progress Notes (Signed)
[facility] [place] [facility]   PATIENT NAME: Jeremy Gutierrez    MR#:  [id number]  DATE OF BIRTH:  September 24, 1950  SUBJECTIVE:  CHIEF COMPLAINT: Pt is  arousable but wants to sleep, denies any complaints today.  No fever overnight  REVIEW OF SYSTEMS:   Review of Systems  Constitutional: Negative for chills and fever.  Eyes: Negative for blurred vision.  Respiratory:  denies any cough and shortness of breath.   Cardiovascular: Negative for chest pain.  Reports history of endocarditis Gastrointestinal: Denies any constipation or diarrhea.  Denies any abdominal pain nausea vomiting  Genitourinary: Negative for dysuria.  Musculoskeletal: Negative for joint pain.  Neurological: Negative for dizziness and headaches.  Skin: Lower leg wounds      DRUG ALLERGIES:   Allergies  Allergen Reactions  . Sulfa Antibiotics Hives    Patient states he is not allergic to this medication    VITALS:  Blood pressure 110/60, pulse 88, temperature 98.6 F (37 C), temperature source Axillary, resp. rate 18, height 5\' 11"  (1.803 m), weight 98.4 kg (217 lb), SpO2 98 %.  PHYSICAL EXAMINATION:  GENERAL:  67 y.o.-year-old patient lying in the bed with no acute distress.  EYES: Pupils equal, round, reactive to light and accommodation. No scleral icterus. Extraocular muscles intact.  HEENT: Head atraumatic, normocephalic. Oropharynx and nasopharynx clear.  NECK:  Supple, no jugular venous distention. No thyroid enlargement, no tenderness.  LUNGS: Normal breath sounds bilaterally, no wheezing, rales,rhonchi or crepitation. No use of accessory muscles of respiration.  CARDIOVASCULAR: S1, S2 normal. No murmurs, rubs, or gallops.  ABDOMEN: Soft, nontender, nondistended. Bowel sounds present. No organomegaly or mass.  EXTREMITIES: .  Bilateral lower extremity chronic ulceration with erythema, open wounds NEUROLOGIC: Arousable, follows verbal commands, motor and sensory are intact.   Oriented x2-3 PSYCHIATRIC: The patient is arousable  sKIN: No obvious rash   LABORATORY PANEL:   CBC Recent Labs  Lab 03/17/17 0401  WBC 14.5*  HGB 14.2  HCT 43.5  PLT 153   ------------------------------------------------------------------------------------------------------------------  Chemistries  Recent Labs  Lab 03/15/17 0928  03/17/17 0401  NA 130*   < > 132*  K 5.2*   < > 3.9  CL 95*   < > 99*  CO2 22   < > 24  GLUCOSE 258*   < > 227*  BUN 99*   < > 81*  CREATININE 1.95*   < > 1.72*  CALCIUM 8.6*   < > 7.8*  AST 64*  --   --   ALT 61  --   --   ALKPHOS 148*  --   --   BILITOT 3.5*  --   --    < > = values in this interval not displayed.   ------------------------------------------------------------------------------------------------------------------  Cardiac Enzymes Recent Labs  Lab 03/15/17 0928  TROPONINI 0.05*   ------------------------------------------------------------------------------------------------------------------  RADIOLOGY:  Mr Tibia Fibula Right Wo Contrast  Result Date: 03/15/2017 CLINICAL DATA:  Chronic lower extremity ulcerations and wounds. EXAM: MRI OF LOWER RIGHT EXTREMITY WITHOUT CONTRAST TECHNIQUE: Multiplanar, multisequence MR imaging of both legs was performed. No intravenous contrast was administered. COMPARISON:  None. FINDINGS: Bones/Joint/Cartilage No acute fracture or suspicious osseous lesions. Probable small scattered cartilaginous rests of the right tibial diaphysis. Nonspecific mild marrow edema of the right mid and hindfoot predominantly involving what appears be the plantar anterior aspect of the calcaneus and navicular as well as anterior aspect of the left calcaneus. These are incompletely imaged. Findings may represent  reactive edema. Possibility of osteomyelitis is not entirely excluded. Ligaments Noncontributory Muscles and Tendons No atrophy.  No evidence of pyomyositis. Soft tissues Diffuse soft tissue edema of  both legs and included feet, left greater than right. No drainable fluid collections or abscess. IMPRESSION: Nonspecific marrow edema of the calcanei and right navicular. In the presence of diffuse soft tissue edema and possible cellulitis, osteomyelitis is not entirely excluded. Findings may simply reflect reactive marrow edema or stigmata of recent trauma among some other considerations. Dedicated MRI of the feet may help for better assessment however as clinically necessary. Diffuse soft tissue edema of both legs likely representing either cellulitis, third spacing of fluid, stigmata of venous insufficiency or combination thereof. No drainable fluid collections. No evidence of abscess or pyomyositis. Electronically Signed   By: Tollie Eth M.D.   On: 03/15/2017 21:40    EKG:   Orders placed or performed during the hospital encounter of 03/15/17  . ED EKG 12-Lead  . ED EKG 12-Lead  . EKG 12-Lead  . EKG 12-Lead    ASSESSMENT AND PLAN:    67 year old male with recent diagnosis of endocarditis due to chronic lower extremity wounds currently on IV vancomycin followed by infectious diseasewho [facility] from [facility] with fevers and lethargy.  1. Sepsis: Patient presents with fever, tachypnea, tachycardia and  with hypotension at the time of admission Sepsis is likely due to right lower extremity cellulitis and chronic wounds He also has PICC line discussed with vascular surgery not recommending removal of PICC line at this time follow up with Dr. Sampson Goon  Continue antibiotics daptomycin and Zosyn.   MRSA PCR is negative.  March 15, 2017 blood cultures has revealed Klebsiella oxytoca Urine culture with Klebsiella pneumonia  wound culture with KLEBSIELLA OXYTOCA and STENOTROPHOMONAS MALTOPHILIA Consider discontinuing PICC line and culture needed from PICC line Surgical and vascular surgical evaluation requested.  Surgery signed off as vascular surgery is following.  Vascular  surgery is recommending an angiogram once patient is clinically stable  MRI  Neg for  osteomyelitis    2 recent diagnosis of.  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 Sampson Goon  Patient will need cardiothoracic evaluation once the infection clears   3. Acute metabolic encephalopathy due to sepsis and underlying dementia  4. Diabetes: sliding scale and ADA diet  low-dose Lantus for now, sliding scale insulin Diabetes nurse consultation requested  5. Chronic diastolic heart failure with preserved ejection fraction: Patient also has history of severe mitral stenosis with mitral regurgitation with lower extremity edema  continue torsemide Consult cardiology  6. Acute on Chronic kidney disease stage III: Creatinine increased from baseline from sepsis with ATN.   Baseline creatinine 1.21; creatinine 2.0-1.72   7BPH: Continue Flomax  8.  Sacral pressure ulcer: Continue wound care and reposition patient.  Wound care is following  Poor prognosis palliative care consulted-     All the records are reviewed and case discussed with Care Management/Social Workerr. Management plans discussed with the patient, family and they are in agreement.  CODE STATUS:  fc  TOTAL TIME TAKING CARE OF THIS PATIENT: 36  minutes.   POSSIBLE D/C IN 2  DAYS, DEPENDING ON CLINICAL CONDITION.  Note: This dictation was prepared with Dragon dictation along with smaller phrase technology. Any transcriptional errors that result from this process are unintentional.   Ramonita Lab M.D on 03/17/2017 at 5:52 PM  Between  7am to 6pm - Pager - [phone] After 6pm go to [email or web address] - password [email or web address]  [facility]  Office  [phone]  CC: Primary care physician; Derwood KaplanEason, Ernest B, MD

## 2017-03-17 NOTE — Progress Notes (Signed)
Inpatient Diabetes Program Recommendations  AACE/ADA: New Consensus Statement on Inpatient Glycemic Control (2015)  Target Ranges:  Prepandial:   less than 140 mg/dL      Peak postprandial:   less than 180 mg/dL (1-2 hours)      Critically ill patients:  140 - 180 mg/dL   Lab Results  Component Value Date   GLUCAP 210 (H) 03/17/2017   HGBA1C 9.8 (H) 02/09/2017    Review of Glycemic Control   Diabetes history: Type 2 Outpatient Diabetes medications: Humalog 75/25 15 units bid Current orders for Inpatient glycemic control: Lantus 8 units qday, Novolog 0-9 units tid  Inpatient Diabetes Program Recommendations: -Novolog 4 units tid with meals (hold if patient eats less than 50%) -Novolog 0-5 units hs correction  Thank you, Darel HongJudy E. Trisa Cranor, RN, MSN, CDE  Diabetes Coordinator Inpatient Glycemic Control Team Team Pager [phone] (8am-5pm) 03/17/2017 11:05 AM

## 2017-03-18 LAB — BASIC METABOLIC PANEL
ANION GAP: 10 (ref 5–15)
BUN: 74 mg/dL — ABNORMAL HIGH (ref 6–20)
CHLORIDE: 100 mmol/L — AB (ref 101–111)
CO2: 25 mmol/L (ref 22–32)
Calcium: 8.2 mg/dL — ABNORMAL LOW (ref 8.9–10.3)
Creatinine, Ser: 1.83 mg/dL — ABNORMAL HIGH (ref 0.61–1.24)
GFR calc Af Amer: 42 mL/min — ABNORMAL LOW (ref >60)
GFR calc non Af Amer: 36 mL/min — ABNORMAL LOW (ref >60)
Glucose, Bld: 171 mg/dL — ABNORMAL HIGH (ref 65–99)
POTASSIUM: 3.7 mmol/L (ref 3.5–5.1)
SODIUM: 135 mmol/L (ref 135–145)

## 2017-03-18 LAB — CBC WITH DIFFERENTIAL/PLATELET
BASOS ABS: 0.1 10*3/uL (ref 0–0.1)
BASOS PCT: 0 %
EOS ABS: 0.2 10*3/uL (ref 0–0.7)
Eosinophils Relative: 1 %
HEMATOCRIT: 45.1 % (ref 40.0–52.0)
HEMOGLOBIN: 14.7 g/dL (ref 13.0–18.0)
Lymphocytes Relative: 10 %
Lymphs Abs: 1.5 10*3/uL (ref 1.0–3.6)
MCH: 32.2 pg (ref 26.0–34.0)
MCHC: 32.6 g/dL (ref 32.0–36.0)
MCV: 98.8 fL (ref 80.0–100.0)
MONOS PCT: 6 %
Monocytes Absolute: 0.9 10*3/uL (ref 0.2–1.0)
NEUTROS ABS: 12.6 10*3/uL — AB (ref 1.4–6.5)
NEUTROS PCT: 83 %
Platelets: 150 10*3/uL (ref 150–440)
RBC: 4.57 MIL/uL (ref 4.40–5.90)
RDW: 17.9 % — ABNORMAL HIGH (ref 11.5–14.5)
WBC: 15.2 10*3/uL — AB (ref 3.8–10.6)

## 2017-03-18 LAB — URINE CULTURE: Culture: 70000 — AB

## 2017-03-18 LAB — GLUCOSE, CAPILLARY
GLUCOSE-CAPILLARY: 158 mg/dL — AB (ref 65–99)
GLUCOSE-CAPILLARY: 166 mg/dL — AB (ref 65–99)
GLUCOSE-CAPILLARY: 121 mg/dL — AB (ref 65–99)
Glucose-Capillary: 115 mg/dL — ABNORMAL HIGH (ref 65–99)

## 2017-03-18 LAB — CULTURE, BLOOD (ROUTINE X 2): Special Requests: ADEQUATE

## 2017-03-18 MED ORDER — DIPHENHYDRAMINE HCL 25 MG PO CAPS
25.0000 mg | ORAL_CAPSULE | Freq: Four times a day (QID) | ORAL | Status: DC | PRN
  Administered 2017-03-18 – 2017-03-19 (×2): 25 mg via ORAL
  Filled 2017-03-18 (×2): qty 1

## 2017-03-18 MED ORDER — TORSEMIDE 20 MG PO TABS
20.0000 mg | ORAL_TABLET | Freq: Every day | ORAL | Status: DC
  Administered 2017-03-19: 20 mg via ORAL
  Filled 2017-03-18: qty 1

## 2017-03-18 MED ORDER — METRONIDAZOLE IN NACL 5-0.79 MG/ML-% IV SOLN
500.0000 mg | Freq: Three times a day (TID) | INTRAVENOUS | Status: DC
  Administered 2017-03-18 – 2017-03-22 (×11): 500 mg via INTRAVENOUS
  Filled 2017-03-18 (×16): qty 100

## 2017-03-18 MED ORDER — LEVOFLOXACIN IN D5W 750 MG/150ML IV SOLN
750.0000 mg | Freq: Every day | INTRAVENOUS | Status: DC
  Administered 2017-03-18: 750 mg via INTRAVENOUS
  Filled 2017-03-18 (×2): qty 150

## 2017-03-18 NOTE — Progress Notes (Signed)
[facility] - [place] at [facility]   PATIENT NAME: Jeremy Gutierrez    MR#:  [id number]  DATE OF BIRTH:  May 30, 1950  SUBJECTIVE:  CHIEF COMPLAINT: Pt  denies any complaints today.  No fever .  PICC line removed  REVIEW OF SYSTEMS:   Review of Systems  Constitutional: Negative for chills and fever.  Eyes: Negative for blurred vision.  Respiratory:  denies any cough and shortness of breath.   Cardiovascular: Negative for chest pain.  Reports history of endocarditis Gastrointestinal: Denies any constipation or diarrhea.  Denies any abdominal pain nausea vomiting  Genitourinary: Negative for dysuria.  Musculoskeletal: Negative for joint pain.  Neurological: Negative for dizziness and headaches.  Skin: Lower leg wounds      DRUG ALLERGIES:   Allergies  Allergen Reactions  . Sulfa Antibiotics Hives    Patient states he is not allergic to this medication    VITALS:  Blood pressure 97/65, pulse 85, temperature 98.3 F (36.8 C), resp. rate 18, height 5\' 11"  (1.803 m), weight 96.4 kg (212 lb 8.4 oz), SpO2 98 %.  PHYSICAL EXAMINATION:  GENERAL:  67 y.o.-year-old patient lying in the bed with no acute distress.  EYES: Pupils equal, round, reactive to light and accommodation. No scleral icterus. Extraocular muscles intact.  HEENT: Head atraumatic, normocephalic. Oropharynx and nasopharynx clear.  NECK:  Supple, no jugular venous distention. No thyroid enlargement, no tenderness.  LUNGS: Normal breath sounds bilaterally, no wheezing, rales,rhonchi or crepitation. No use of accessory muscles of respiration.  CARDIOVASCULAR: S1, S2 normal. No murmurs, rubs, or gallops.  ABDOMEN: Soft, nontender, nondistended. Bowel sounds present. No organomegaly or mass.  EXTREMITIES: .  Bilateral lower extremity chronic ulceration with erythema, open wounds NEUROLOGIC: Arousable, follows verbal commands, motor and sensory are intact.  Oriented x2-3 PSYCHIATRIC: The patient is  arousable  sKIN: No obvious rash   LABORATORY PANEL:   CBC Recent Labs  Lab 03/18/17 0427  WBC 15.2*  HGB 14.7  HCT 45.1  PLT 150   ------------------------------------------------------------------------------------------------------------------  Chemistries  Recent Labs  Lab 03/15/17 0928  03/18/17 0427  NA 130*   < > 135  K 5.2*   < > 3.7  CL 95*   < > 100*  CO2 22   < > 25  GLUCOSE 258*   < > 171*  BUN 99*   < > 74*  CREATININE 1.95*   < > 1.83*  CALCIUM 8.6*   < > 8.2*  AST 64*  --   --   ALT 61  --   --   ALKPHOS 148*  --   --   BILITOT 3.5*  --   --    < > = values in this interval not displayed.   ------------------------------------------------------------------------------------------------------------------  Cardiac Enzymes Recent Labs  Lab 03/15/17 0928  TROPONINI 0.05*   ------------------------------------------------------------------------------------------------------------------  RADIOLOGY:  No results found.  EKG:   Orders placed or performed during the hospital encounter of 03/15/17  . ED EKG 12-Lead  . ED EKG 12-Lead  . EKG 12-Lead  . 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  1. Sepsis: Patient presents with fever, tachypnea, tachycardia and  with hypotension at the time of admission Sepsis is likely due to right lower extremity cellulitis and chronic wounds He also has PICC  line which is removed today January 27 and the tip will be sent for culture as recommended by vascular surgery follow up with Dr. Sampson GoonFitzgerald  Continue antibiotics daptomycin and D/C  Zosyn.  Patient is started on levofloxacin and Flagyl, infectious disease is agreeable as per pharmacy report   MRSA PCR is negative.  March 15, 2017 blood cultures  has revealed Klebsiella oxytoca Urine culture with Klebsiella pneumonia  wound culture with KLEBSIELLA OXYTOCA and STENOTROPHOMONAS MALTOPHILIA .  Vascular surgery is recommending an angiogram once patient is clinically stable  MRI  Neg for  osteomyelitis    2 recent diagnosis of.  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 Sampson GoonFitzgerald  Patient will need cardiothoracic evaluation once the infection clears   3. Acute metabolic encephalopathy due to sepsis and underlying dementia  4. Diabetes: sliding scale and ADA diet  low-dose Lantus for now, sliding scale insulin Diabetes nurse consultation requested  5. Chronic diastolic heart failure with preserved ejection fraction: Patient also has history of severe mitral stenosis with mitral regurgitation with lower extremity edema  continue torsemide at a decreased dose in view of renal insufficiency.  60 mg is changed to 20 mg Consult cardiology  6. Acute on Chronic kidney disease stage III: Creatinine increased from baseline from sepsis with ATN.   Baseline creatinine 1.21; creatinine 2.0-1.72 -1.83   7BPH: Continue Flomax  8.  Sacral pressure ulcer: Continue wound care and reposition patient.  Wound care is following  Poor prognosis palliative care consulted-     All the records are reviewed and case discussed with Care Management/Social Workerr. Management plans discussed with the patient, family and they are in agreement.  CODE STATUS:  fc  TOTAL TIME TAKING CARE OF THIS PATIENT: 36  minutes.   POSSIBLE D/C IN 2  DAYS, DEPENDING ON CLINICAL CONDITION.  Note: This dictation was prepared with Dragon dictation along with smaller phrase technology. Any transcriptional errors that result from this process are unintentional.   Ramonita LabAruna Feliberto Stockley M.D on 03/18/2017 at 1:23 PM  Between 7am to 6pm - Pager -  [phone] After 6pm go to [email or web address] - password [email or web address] [facility]  [facility]  Office  [phone]  CC: Primary care physician; Derwood KaplanEason, Ernest B, MD

## 2017-03-18 NOTE — Progress Notes (Signed)
   Subjective/Chief Complaint: Lethargic   Objective: Vital signs in last 24 hours: Temp:  [97.8 F (36.6 C)-98.6 F (37 C)] 98.3 F (36.8 C) (01/27 0425) Pulse Rate:  [85-105] 85 (01/27 0425) Resp:  [18] 18 (01/27 0425) BP: (97-110)/(60-67) 97/65 (01/27 0425) SpO2:  [98 %-99 %] 98 % (01/27 0425) Weight:  [96.4 kg (212 lb 8.4 oz)] 96.4 kg (212 lb 8.4 oz) (01/27 0425) Last BM Date: (unknown )  Intake/Output from previous day: 01/26 0701 - 01/27 0700 In: 540 [P.O.:240; IV Piggyback:300] Out: 1200 [Urine:1200] Intake/Output this shift: No intake/output data recorded.  General appearance: Lethargic Cardio: regular rate and rhythm, S1, S2 normal, no murmur, click, rub or gallop Extremities: Right Leg: Erythema/edema- unchanged  Lab Results:  Recent Labs    03/17/17 0401 03/18/17 0427  WBC 14.5* 15.2*  HGB 14.2 14.7  HCT 43.5 45.1  PLT 153 150   BMET Recent Labs    03/17/17 0401 03/18/17 0427  NA 132* 135  K 3.9 3.7  CL 99* 100*  CO2 24 25  GLUCOSE 227* 171*  BUN 81* 74*  CREATININE 1.72* 1.83*  CALCIUM 7.8* 8.2*   PT/INR No results for input(s): LABPROT, INR in the last 72 hours. ABG No results for input(s): PHART, HCO3 in the last 72 hours.  Invalid input(s): PCO2, PO2  Studies/Results: No results found.  Anti-infectives: Anti-infectives (From admission, onward)   Start     Dose/Rate Route Frequency Ordered Stop   03/16/17 1000  vancomycin (VANCOCIN) 1,250 mg in sodium chloride 0.9 % 250 mL IVPB  Status:  Discontinued     1,250 mg 166.7 mL/hr over 90 Minutes Intravenous Every 24 hours 03/15/17 1225 03/15/17 1548   03/15/17 1600  piperacillin-tazobactam (ZOSYN) IVPB 3.375 g     3.375 g 12.5 mL/hr over 240 Minutes Intravenous Every 8 hours 03/15/17 1225     03/15/17 1600  DAPTOmycin (CUBICIN) 685.5 mg in sodium chloride 0.9 % IVPB     8 mg/kg  85.7 kg (Adjusted) 227.4 mL/hr over 30 Minutes Intravenous Every 24 hours 03/15/17 1557     03/15/17  1115  vancomycin (VANCOCIN) 1,750 mg in sodium chloride 0.9 % 500 mL IVPB     1,750 mg 250 mL/hr over 120 Minutes Intravenous  Once 03/15/17 1102 03/15/17 1331   03/15/17 1000  piperacillin-tazobactam (ZOSYN) IVPB 3.375 g     3.375 g 100 mL/hr over 30 Minutes Intravenous  Once 03/15/17 0946 03/15/17 1034      Assessment/Plan: s/p * No surgery found * Remove PICC, send for culture after peripheral access obtained.  Poor prognosis. Remains a poor candidate for any Vascular Surgery intervention at this time. Continue IV antibiotics, follow cultures, local wound care for leg.  LOS: 3 days    Bertram Denversco, Miechia A 03/18/2017

## 2017-03-18 NOTE — Consult Note (Signed)
Reason for Consult: Sepsis possible endocarditis Referring Physician: Dr. Henrene Hawking Dr. Benjie Karvonen hospitalist Cardiologist Clayborn Bigness  Jeremy Gutierrez is an 67 y.o. male.  HPI: 67 year old male known extensive severe lower extremity cellulitis peripheral vascular disease poor circulation diabetes.  Patient was recently admitted with fever sepsis altered mental status.  Patient had had a PICC line and was a nursing home getting antibiotic therapy but got progressively worse has significant MRSA and was thought to have possible endocarditis of the mitral valve.  Patient now is lethargic and has altered mental status poorly responsive on broad-spectrum antibiotic therapy but poor wound healing of his lower extremities.  Infectious disease is suggested possibly to get a TEE in the morning I am not sure that is possible because of his unable to get consent from the patient because he is not able to answer questions.  Past Medical History:  Diagnosis Date  . Aneurysm ([place])   . Arthritis   . CHF (congestive heart failure) ([place])   . Diabetes mellitus without complication ([place])   . Heart murmur    as a child- 'nothing to worry about'  . Sleep apnea     Past Surgical History:  Procedure Laterality Date  . BUNIONECTOMY Right   . CARDIAC CATHETERIZATION  2010  . CATARACT EXTRACTION W/PHACO Right 06/05/2012   Procedure: CATARACT EXTRACTION PHACO AND INTRAOCULAR LENS PLACEMENT (IOC);  Surgeon: Adonis Brook, MD;  Location: [facility];  Service: Ophthalmology;  Laterality: Right;  . Colonscopy  2012  . TEE WITHOUT CARDIOVERSION N/A 02/14/2017   Procedure: TRANSESOPHAGEAL ECHOCARDIOGRAM (TEE);  Surgeon: Teodoro Spray, MD;  Location: [facility];  Service: Cardiovascular;  Laterality: N/A;    Family History  Problem Relation Age of Onset  . CAD Mother   . Cancer Sister     Social History:  reports that he has been smoking cigarettes.  He has a 20.00 pack-year smoking history. he has never used smokeless tobacco.  He reports that he drinks alcohol. He reports that he does not use drugs.  Allergies:  Allergies  Allergen Reactions  . Sulfa Antibiotics Hives    Patient states he is not allergic to this medication    Medications: Medication list reviewed please see H&P for complete prehospital medication  Results for orders placed or performed during the hospital encounter of 03/15/17 (from the past 48 hour(s))  Glucose, capillary     Status: Abnormal   Collection Time: 03/16/17  8:55 PM  Result Value Ref Range   Glucose-Capillary 224 (H) 65 - 99 mg/dL   Comment 1 Notify RN    Comment 2 Document in Chart   CK     Status: None   Collection Time: 03/17/17  4:01 AM  Result Value Ref Range   Total CK 109 49 - 397 U/L    Comment: Performed at [facility], [address] [place], [place] [address]  Basic metabolic panel     Status: Abnormal   Collection Time: 03/17/17  4:01 AM  Result Value Ref Range   Sodium 132 (L) 135 - 145 mmol/L   Potassium 3.9 3.5 - 5.1 mmol/L   Chloride 99 (L) 101 - 111 mmol/L   CO2 24 22 - 32 mmol/L   Glucose, Bld 227 (H) 65 - 99 mg/dL   BUN 81 (H) 6 - 20 mg/dL   Creatinine, Ser 1.72 (H) 0.61 - 1.24 mg/dL   Calcium 7.8 (L) 8.9 - 10.3 mg/dL   GFR calc non Af Amer 39 (  L) >60 mL/min   GFR calc Af Amer 45 (L) >60 mL/min    Comment: (NOTE) The eGFR has been calculated using the CKD EPI equation. This calculation has not been validated in all clinical situations. eGFR's persistently <60 mL/min signify possible Chronic Kidney Disease.    Anion gap 9 5 - 15    Comment: Performed at [facility], [address] [place], [place] [address]  CBC     Status: Abnormal   Collection Time: 03/17/17  4:01 AM  Result Value Ref Range   WBC 14.5 (H) 3.8 - 10.6 K/uL   RBC 4.44 4.40 - 5.90 MIL/uL   Hemoglobin 14.2 13.0 - 18.0 g/dL   HCT 43.5 40.0 - 52.0 %   MCV 98.0 80.0 - 100.0 fL   MCH 32.0 26.0 - 34.0 pg   MCHC 32.7 32.0 - 36.0 g/dL   RDW 17.6 (H) 11.5 -  14.5 %   Platelets 153 150 - 440 K/uL    Comment: Performed at [facility], [address] [place], [place] [address]  Glucose, capillary     Status: Abnormal   Collection Time: 03/17/17  7:49 AM  Result Value Ref Range   Glucose-Capillary 210 (H) 65 - 99 mg/dL  Glucose, capillary     Status: Abnormal   Collection Time: 03/17/17 11:34 AM  Result Value Ref Range   Glucose-Capillary 211 (H) 65 - 99 mg/dL  Glucose, capillary     Status: Abnormal   Collection Time: 03/17/17  5:04 PM  Result Value Ref Range   Glucose-Capillary 164 (H) 65 - 99 mg/dL  Glucose, capillary     Status: Abnormal   Collection Time: 03/17/17  9:09 PM  Result Value Ref Range   Glucose-Capillary 209 (H) 65 - 99 mg/dL  CBC with Differential/Platelet     Status: Abnormal   Collection Time: 03/18/17  4:27 AM  Result Value Ref Range   WBC 15.2 (H) 3.8 - 10.6 K/uL   RBC 4.57 4.40 - 5.90 MIL/uL   Hemoglobin 14.7 13.0 - 18.0 g/dL   HCT 45.1 40.0 - 52.0 %   MCV 98.8 80.0 - 100.0 fL   MCH 32.2 26.0 - 34.0 pg   MCHC 32.6 32.0 - 36.0 g/dL   RDW 17.9 (H) 11.5 - 14.5 %   Platelets 150 150 - 440 K/uL   Neutrophils Relative % 83 %   Neutro Abs 12.6 (H) 1.4 - 6.5 K/uL   Lymphocytes Relative 10 %   Lymphs Abs 1.5 1.0 - 3.6 K/uL   Monocytes Relative 6 %   Monocytes Absolute 0.9 0.2 - 1.0 K/uL   Eosinophils Relative 1 %   Eosinophils Absolute 0.2 0 - 0.7 K/uL   Basophils Relative 0 %   Basophils Absolute 0.1 0 - 0.1 K/uL    Comment: Performed at [facility], [address] [place], [place] [address]  Basic metabolic panel     Status: Abnormal   Collection Time: 03/18/17  4:27 AM  Result Value Ref Range   Sodium 135 135 - 145 mmol/L   Potassium 3.7 3.5 - 5.1 mmol/L   Chloride 100 (L) 101 - 111 mmol/L   CO2 25 22 - 32 mmol/L   Glucose, Bld 171 (H) 65 - 99 mg/dL   BUN 74 (H) 6 - 20 mg/dL   Creatinine, Ser 1.83 (H) 0.61 - 1.24 mg/dL   Calcium 8.2 (L) 8.9 - 10.3 mg/dL   GFR calc non Af Amer 36  (  L) >60 mL/min   GFR calc Af Amer 42 (L) >60 mL/min    Comment: (NOTE) The eGFR has been calculated using the CKD EPI equation. This calculation has not been validated in all clinical situations. eGFR's persistently <60 mL/min signify possible Chronic Kidney Disease.    Anion gap 10 5 - 15    Comment: Performed at [facility], [address] [place], [place] [address]  Glucose, capillary     Status: Abnormal   Collection Time: 03/18/17  7:50 AM  Result Value Ref Range   Glucose-Capillary 158 (H) 65 - 99 mg/dL  Glucose, capillary     Status: Abnormal   Collection Time: 03/18/17 12:12 PM  Result Value Ref Range   Glucose-Capillary 166 (H) 65 - 99 mg/dL  Glucose, capillary     Status: Abnormal   Collection Time: 03/18/17  4:56 PM  Result Value Ref Range   Glucose-Capillary 121 (H) 65 - 99 mg/dL    No results found.  Review of Systems  Constitutional: Positive for chills, diaphoresis, fever and malaise/fatigue.  HENT: Positive for congestion.   Eyes: Negative.   Respiratory: Positive for shortness of breath.   Cardiovascular: Positive for orthopnea, leg swelling and PND.  Gastrointestinal: Negative.   Genitourinary: Negative.   Musculoskeletal: Positive for falls and myalgias.  Skin: Positive for rash.  Neurological: Positive for dizziness, tingling, loss of consciousness and weakness.  Endo/Heme/Allergies: Negative.   Psychiatric/Behavioral:       Altered mental status   Blood pressure (!) 118/92, pulse 67, temperature (!) 97.5 F (36.4 C), temperature source Oral, resp. rate 18, height _0  (1.803 m), weight 212 lb 8.4 oz (96.4 kg), SpO2 99 %. 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 Plan Agreed with admission for rule out myocardial infarction With broad-spectrum  antibiotic therapy Infectious disease consult is appreciated Consider TEE for evidence of endocarditis Agree with vascular for wound care therapy and wrapping Continue heart failure therapy Recommend gentle hydration Advised to refrain from smoking CPAP for obstructive sleep apnea  Sydney Azure D Kelsea Mousel 03/18/2017, 5:14 PM

## 2017-03-19 ENCOUNTER — Ambulatory Visit (INDEPENDENT_AMBULATORY_CARE_PROVIDER_SITE_OTHER): Payer: Medicare Other | Admitting: Vascular Surgery

## 2017-03-19 DIAGNOSIS — I339 Acute and subacute endocarditis, unspecified: Secondary | ICD-10-CM

## 2017-03-19 DIAGNOSIS — Z7189 Other specified counseling: Secondary | ICD-10-CM

## 2017-03-19 DIAGNOSIS — Z515 Encounter for palliative care: Secondary | ICD-10-CM

## 2017-03-19 DIAGNOSIS — N289 Disorder of kidney and ureter, unspecified: Secondary | ICD-10-CM

## 2017-03-19 DIAGNOSIS — I5033 Acute on chronic diastolic (congestive) heart failure: Secondary | ICD-10-CM

## 2017-03-19 DIAGNOSIS — L0390 Cellulitis, unspecified: Secondary | ICD-10-CM

## 2017-03-19 DIAGNOSIS — A419 Sepsis, unspecified organism: Principal | ICD-10-CM

## 2017-03-19 LAB — CBC WITH DIFFERENTIAL/PLATELET
BASOS ABS: 0 10*3/uL (ref 0–0.1)
Basophils Relative: 0 %
Eosinophils Absolute: 0.1 10*3/uL (ref 0–0.7)
Eosinophils Relative: 1 %
HEMATOCRIT: 43.7 % (ref 40.0–52.0)
Hemoglobin: 14.4 g/dL (ref 13.0–18.0)
LYMPHS ABS: 1.2 10*3/uL (ref 1.0–3.6)
LYMPHS PCT: 8 %
MCH: 32.3 pg (ref 26.0–34.0)
MCHC: 33.1 g/dL (ref 32.0–36.0)
MCV: 97.7 fL (ref 80.0–100.0)
MONO ABS: 1 10*3/uL (ref 0.2–1.0)
MONOS PCT: 7 %
NEUTROS ABS: 12.2 10*3/uL — AB (ref 1.4–6.5)
Neutrophils Relative %: 84 %
Platelets: 160 10*3/uL (ref 150–440)
RBC: 4.47 MIL/uL (ref 4.40–5.90)
RDW: 18 % — AB (ref 11.5–14.5)
WBC: 14.5 10*3/uL — ABNORMAL HIGH (ref 3.8–10.6)

## 2017-03-19 LAB — AEROBIC/ANAEROBIC CULTURE (SURGICAL/DEEP WOUND)

## 2017-03-19 LAB — BASIC METABOLIC PANEL
ANION GAP: 12 (ref 5–15)
BUN: 69 mg/dL — ABNORMAL HIGH (ref 6–20)
CALCIUM: 8 mg/dL — AB (ref 8.9–10.3)
CO2: 24 mmol/L (ref 22–32)
Chloride: 96 mmol/L — ABNORMAL LOW (ref 101–111)
Creatinine, Ser: 1.77 mg/dL — ABNORMAL HIGH (ref 0.61–1.24)
GFR calc Af Amer: 44 mL/min — ABNORMAL LOW (ref >60)
GFR calc non Af Amer: 38 mL/min — ABNORMAL LOW (ref >60)
GLUCOSE: 179 mg/dL — AB (ref 65–99)
Potassium: 3.8 mmol/L (ref 3.5–5.1)
Sodium: 132 mmol/L — ABNORMAL LOW (ref 135–145)

## 2017-03-19 LAB — CULTURE, BLOOD (ROUTINE X 2)

## 2017-03-19 LAB — GLUCOSE, CAPILLARY
GLUCOSE-CAPILLARY: 178 mg/dL — AB (ref 65–99)
GLUCOSE-CAPILLARY: 168 mg/dL — AB (ref 65–99)
Glucose-Capillary: 64 mg/dL — ABNORMAL LOW (ref 65–99)
Glucose-Capillary: 65 mg/dL (ref 65–99)
Glucose-Capillary: 106 mg/dL — ABNORMAL HIGH (ref 65–99)
Glucose-Capillary: 101 mg/dL — ABNORMAL HIGH (ref 65–99)

## 2017-03-19 LAB — AEROBIC/ANAEROBIC CULTURE W GRAM STAIN (SURGICAL/DEEP WOUND)

## 2017-03-19 MED ORDER — KETOROLAC TROMETHAMINE 30 MG/ML IJ SOLN
30.0000 mg | Freq: Once | INTRAMUSCULAR | Status: AC
  Administered 2017-03-19: 30 mg via INTRAVENOUS
  Filled 2017-03-19: qty 1

## 2017-03-19 MED ORDER — DIPHENHYDRAMINE HCL 25 MG PO CAPS
25.0000 mg | ORAL_CAPSULE | Freq: Four times a day (QID) | ORAL | Status: DC | PRN
Start: 2017-03-19 — End: 2017-03-23
  Administered 2017-03-21: 23:00:00 25 mg via ORAL
  Filled 2017-03-19: qty 1

## 2017-03-19 MED ORDER — LEVOFLOXACIN IN D5W 750 MG/150ML IV SOLN
750.0000 mg | INTRAVENOUS | Status: DC
  Administered 2017-03-20 – 2017-03-22 (×2): 750 mg via INTRAVENOUS
  Filled 2017-03-19 (×2): qty 150

## 2017-03-19 MED ORDER — GERHARDT'S BUTT CREAM
TOPICAL_CREAM | Freq: Two times a day (BID) | CUTANEOUS | Status: DC
  Administered 2017-03-19 – 2017-03-23 (×8): via TOPICAL
  Filled 2017-03-19: qty 1

## 2017-03-19 NOTE — Progress Notes (Addendum)
Pt found with knees on the floor with upper body still on the bed. VS stable. MD Sheryle Hailiamond notified. Re-educated patient on safety and using the call bell when he needs anything. Notified daughter Samuel JesterBrittany Chestnut of the fall.

## 2017-03-19 NOTE — Progress Notes (Signed)
Pharmacy Antibiotic Note  Jeremy Gutierrez is a 67 y.o. male admitted on 03/15/2017 with MSSA and CoNS mecA positive endocarditis, newly diagnosed Klebsiella bacteremia.  Pharmacy has been consulted for daptomycin dosing.  Patient was on IV vancomycin PTA for endocarditis but presented to ED spiking fevers. Newly diagnosed Klebsiella bacteremia. ID following and d/c'd vancomycin and initiated daptomycin.  Wound cultures showing moderate growth of  STENOTROPHOMONAS MALTOPHILIA. Patient has been started on levofloxacin. Zosyn has been discontinued.   Plan: Continue daptomycin (8 mg/kg adjusted body weight) IV q24h Adjusted body weight = 85 kg Baseline CK 463. 1/26 CK: 109  CK to be monitored at least weekly  Height: 5\' 11"  (180.3 cm) Weight: 213 lb 8 oz (96.8 kg) IBW/kg (Calculated) : 75.3  Temp (24hrs), Avg:98 F (36.7 C), Min:97.5 F (36.4 C), Max:98.6 F (37 C)  Recent Labs  Lab 03/15/17 0928 03/15/17 1115 03/16/17 0530 03/17/17 0401 03/18/17 0427  WBC 13.8*  --  16.2* 14.5* 15.2*  CREATININE 1.95*  --  2.00* 1.72* 1.83*  LATICACIDVEN 3.7* 3.9*  --   --   --     Estimated Creatinine Clearance: 45.8 mL/min (A) (by C-G formula based on SCr of 1.83 mg/dL (H)).    Allergies  Allergen Reactions  . Sulfa Antibiotics Hives    Patient states he is not allergic to this medication    Thank you for allowing pharmacy to be a part of this patient's care.  Gardner CandleSheema M Kaesyn Johnston, PharmD, BCPS Clinical Pharmacist 03/19/2017 10:07 AM

## 2017-03-19 NOTE — Progress Notes (Signed)
Inpatient Diabetes Program Recommendations  AACE/ADA: New Consensus Statement on Inpatient Glycemic Control (2015)  Target Ranges:  Prepandial:   less than 140 mg/dL      Peak postprandial:   less than 180 mg/dL (1-2 hours)      Critically ill patients:  140 - 180 mg/dL   Results for Merideth AbbeyLSTON, Canuto L (MRN [id number]) as of 03/19/2017 11:39  Ref. Range 03/18/2017 07:50 03/18/2017 12:12 03/18/2017 16:56 03/18/2017 21:15  Glucose-Capillary Latest Ref Range: 65 - 99 mg/dL 409158 (H) 811166 (H) 914121 (H) 115 (H)   Results for Merideth AbbeyLSTON, Trek L (MRN [id number]) as of 03/19/2017 11:39  Ref. Range 03/19/2017 07:43  Glucose-Capillary Latest Ref Range: 65 - 99 mg/dL 65    Home DM Meds: Humalog 75/25 Insulin- 15 units BID  Current Insulin Orders: Novolog Sensitive Correction Scale/ SSI (0-9 units) TID AC + HS      Novolog 4 units TID      Note patient with Hypoglycemia this AM.  Note that Lantus discontinued.    MD- Please consider the following in-hospital insulin adjustments:  1. Restart Lantus at lower dose- Lantus 5 units daily  2. Reduce Novolog Meal Coverage to: Novolog 3 units TID with meals (hold if pt eats <50% of meal)      --Will follow patient during hospitalization--  Ambrose FinlandJeannine Johnston Ausha Sieh RN, MSN, CDE Diabetes Coordinator Inpatient Glycemic Control Team Team Pager: [phone] (8a-5p)

## 2017-03-19 NOTE — Consult Note (Signed)
Consultation Note Date: 03/19/2017   Patient Name: Jeremy Gutierrez  DOB: 1950-09-26  MRN: [id number]  Age / Sex: 67 y.o., male  PCP: Jeremy Noble, MD Referring Physician: Nicholes Mango, MD  Reason for Consultation: Establishing goals of care  HPI/Patient Profile: 67 y.o. male  with past medical history of CHF, diabetes mellitus, heart murmur, sleep apnea, arthritis, and aneurysm admitted on 03/15/2017 with fever. 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 Followed by ID and was to receive IV vancomycin through February 6th. ID, vascular surgery, and cardiology following. 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 Patient is a poor candidate for angiography. Receiving antibiotics. Also with chronic diastolic heart failure, severe mitral stenosis with mitral regurgitation, and acute on chronic kidney disease stage III. Palliative medicine consultation for goals of care.   Clinical Assessment and Goals of Care: I have reviewed medical records, discussed with care team, and met with daughter Jeremy Gutierrez) in family waiting room to discuss diagnosis, prognosis, GOC, EOL wishes, disposition and options. Patient wakes to voice but remains pleasantly confused. The patient's good friend is also present during our conversation with Jeremy Gutierrez's permission.  Introduced Palliative Medicine as specialized medical care for people living with serious illness. It focuses on providing relief from the symptoms and stress of a serious illness.   We discussed a brief life review of the patient. Prior to hospitalization, patient at SNF for rehab from recent hospitalization when he was diagnosed with endocarditis. He has had PICC and been receiving IV antibiotics.   Jeremy Gutierrez is very close with her father and calls him  her "best friend." Jeremy Gutierrez tells me her father has always disliked going to doctors appointments and has been very private in regards to his health history. She did not know he had lower extremity wounds until hospitalization in December. Jeremy Gutierrez has encouraged her father over and over again to take care of himself and manage his diabetes because of the fear of "losing limbs." She also speaks of him being "stubborn" and unwilling to listen to her.   Discussed hospital diagnoses and interventions. Jeremy Gutierrez has spoken with Dr. Ola Gutierrez and has a good understanding of how sick her father is, including poor prognosis. She was told two months and that he needed hospice. Jeremy Gutierrez asks me how long I feel her father has left and she is worried it is less than two months. I explained my concern with high risk for decompensation at any time and the need to make decisions with how 'aggressive' to be if he decompensated, including resuscitation and life support. Jeremy Gutierrez nods her head no and tells me she does not want him to "suffer." Friend states his concern with resuscitating Jeremy Gutierrez. I explained what code blue would look like and my recommendation for DNR/DNI with underlying co-morbidities and very poor chance of him surviving CPR.   Jeremy Gutierrez does not have a documented living will or documented HCPOA. Jeremy Gutierrez has two  siblings, Jeremy Gutierrez and Jeremy Gutierrez. She has been main decision maker since he became sick (was declared incompetent last month when he tried to leave AMA). Jeremy Gutierrez understands she is faced with big decisions but would like to involve Jeremy Gutierrez and Jeremy Gutierrez before making any decisions.  Jeremy Gutierrez speaks of Jeremy Gutierrez travelling a lot recently. She feels he has been trying to live life to the fullest in order to "die happy." She feels he knows how sick he is, just has not wanted to discuss with her.   She becomes very tearful during the conversation at the thought of having to make these decisions. Jeremy Gutierrez  frantically tells me she needs to leave and take care of business, including information from her father's lockbox. She wonders if he has any documentation in regards to his EOL wishes. She also requests I take her to medical records.   Questions and concerns were addressed. Emotional support provided. Jeremy Gutierrez tells me her siblings can meet with me sometime on Thursday. PMT contact information given.  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   SUMMARY OF RECOMMENDATIONS    Continue FULL code/ FULL scope for now.   The patient does not have a documented living will or HCPOA. He has three adult children.  Met with daughter, Jeremy Gutierrez, who has been most involved. She has a good understanding of diagnoses, interventions, and poor prognosis. She does not want her father to suffer. She also does not want to make medical decisions without her siblings.   Jeremy Gutierrez is requesting palliative meeting with her brother and sister on Thursday.   PMT will continue to follow daily and support patient/family through hospitalization.   Code Status/Advance Care Planning:  Full code-medical recommendation for DNR/DNI with multiple co-morbidities, poor prognosis, and poor chance of survival with CPR.   Symptom Management:   Per attending  Palliative Prophylaxis:   Aspiration, Bowel Regimen, Delirium Protocol, Frequent Pain Assessment, Oral Care and Turn Reposition  Additional Recommendations (Limitations, Scope, Preferences):  Full Scope Treatment  Psycho-social/Spiritual:   Desire for further Chaplaincy support:yes  Additional Recommendations: Caregiving  Support/Resources and Education on Hospice  Prognosis:   Unable to determine  Discharge Planning: To Be Determined      Primary Diagnoses: Present on Admission: . Sepsis (Jeremy Gutierrez)   I have reviewed the medical record, interviewed the patient and family, and examined the  patient. The following aspects are pertinent.  Past Medical History:  Diagnosis Date  . Aneurysm (Jeremy Gutierrez)   . Arthritis   . CHF (congestive heart failure) (Jeremy Gutierrez)   . Diabetes mellitus without complication (Jeremy Gutierrez)   . Heart murmur    as a child- 'nothing to worry about'  . Sleep apnea    Social History   Socioeconomic History  . Marital status: Divorced    Spouse name: None  . Number of children: None  . Years of education: None  . Highest education level: None  Social Needs  . Financial resource strain: None  . Food insecurity - worry: None  . Food insecurity - inability: None  . Transportation needs - medical: None  . Transportation needs - non-medical: None  Occupational History  . None  Tobacco Use  . Smoking status: Current Every Day Smoker    Packs/day: 0.50    Years: 40.00    Pack years: 20.00    Types: Cigarettes    Last attempt to quit: 06/02/2012    Years since quitting: 4.7  .  Smokeless tobacco: Never Used  . Tobacco comment: smoked for many years.  Substance and Sexual Activity  . Alcohol use: Yes    Comment: social  . Drug use: No  . Sexual activity: None  Other Topics Concern  . None  Social History Narrative  . None   Family History  Problem Relation Age of Onset  . CAD Mother   . Cancer Sister    Scheduled Meds: . aspirin EC  81 mg Oral Daily  . bacitracin   Topical BID  . bacitracin   Topical Daily  . enoxaparin (LOVENOX) injection  40 mg Subcutaneous Q24H  . Gerhardt's butt cream   Topical BID  . insulin aspart  0-5 Units Subcutaneous QHS  . insulin aspart  0-9 Units Subcutaneous TID WC  . insulin aspart  4 Units Subcutaneous TID WC  . montelukast  10 mg Oral Daily  . tamsulosin  0.4 mg Oral Daily  . torsemide  20 mg Oral Daily  . [START ON 04/14/2017] Vitamin D (Ergocalciferol)  50,000 Units Oral Q30 days   Continuous Infusions: . DAPTOmycin (CUBICIN)  IV 685.5 mg (03/19/17 1607)  . [START ON 03/20/2017] levofloxacin (LEVAQUIN) IV    .  metronidazole Stopped (03/19/17 1307)   PRN Meds:.acetaminophen **OR** acetaminophen, bisacodyl, diphenhydrAMINE, ondansetron **OR** ondansetron (ZOFRAN) IV, polyethylene glycol, traMADol Medications Prior to Admission:  Prior to Admission medications   Medication Sig Start Date End Date Taking? Authorizing Provider  aspirin 81 MG tablet Take 81 mg by mouth daily.   Yes [provider]  carvedilol (COREG) 3.125 MG tablet Take 3.125 mg by mouth 2 (two) times daily with a meal.   Yes [provider]  collagenase (SANTYL) ointment Apply 1 application topically daily as needed.   Yes [provider]  insulin lispro protamine-lispro (HUMALOG 75/25 MIX) (75-25) 100 UNIT/ML SUSP injection Inject 15 Units into the skin 2 (two) times daily with a meal. 02/18/17  Yes Mody, Sital, MD  montelukast (SINGULAIR) 10 MG tablet take 1 tablet daily 12/23/16  Yes [provider]  simvastatin (ZOCOR) 20 MG tablet Take 20 mg by mouth every evening.   Yes [provider]  tamsulosin (FLOMAX) 0.4 MG CAPS capsule Take 0.4 mg by mouth daily. 01/24/17  Yes [provider]  torsemide (DEMADEX) 20 MG tablet Take 3 tablets (60 mg total) by mouth daily. Patient taking differently: Take 20-40 mg by mouth 2 (two) times daily.  02/19/17  Yes Mody, Ulice Bold, MD  vancomycin (VANCOCIN) 1-5 GM/200ML-% SOLN Inject 200 mLs (1,000 mg total) into the vein every 18 (eighteen) hours. 02/18/17 03/22/17 Yes ModyUlice Bold, MD  Vitamin D, Ergocalciferol, (DRISDOL) 50000 units CAPS capsule Take 1 capsule by mouth every 30 (thirty) days. 01/24/17  Yes [provider]  hydrOXYzine (ATARAX/VISTARIL) 25 MG tablet TAKE 1 TO 2 TABLETS BY MOUTH AT BEDTIME IF NEEDED FOR ITCHING 01/08/17   [provider]  ONE TOUCH ULTRA TEST test strip  12/20/16   [provider]   Allergies  Allergen Reactions  . Sulfa Antibiotics Hives    Patient states he is not allergic to this medication     Review of Systems  Unable to perform ROS: Mental status change   Physical Exam  Constitutional: He is easily aroused. He appears ill.  HENT:  Head: Normocephalic and atraumatic.  Cardiovascular: Regular rhythm.  Pulmonary/Chest: No accessory muscle usage. No tachypnea. No respiratory distress. He has decreased breath sounds.  Abdominal: There is no tenderness.  Neurological:  He is easily aroused.  Wakes to voice, oriented to person. Pleasantly confused  Skin: Skin is warm and dry.  Multiple BLE wounds  Psychiatric: His speech is delayed. Cognition and memory are impaired. He is inattentive.  Nursing note and vitals reviewed.  Vital Signs: BP 101/64   Pulse 97   Temp 99.4 F (37.4 C) (Oral)   Resp 13   Ht 5' 11"  (1.803 m)   Wt 96.8 kg (213 lb 8 oz)   SpO2 96%   BMI 29.78 kg/m  Pain Assessment: 0-10 POSS *See Group Information*: 2-Acceptable,Slightly drowsy, easily aroused Pain Score: 6   SpO2: SpO2: 96 % O2 Device:SpO2: 96 % O2 Flow Rate: .O2 Flow Rate (L/min): 0 L/min  IO: Intake/output summary:   Intake/Output Summary (Last 24 hours) at 03/19/2017 1630 Last data filed at 03/19/2017 0900 Gross per 24 hour  Intake 983 ml  Output 1750 ml  Net -767 ml    LBM: Last BM Date: 03/17/17 Baseline Weight: Weight: 93.9 kg (207 lb) Most recent weight: Weight: 96.8 kg (213 lb 8 oz)     Palliative Assessment/Data: PPS 40%   Flowsheet Rows     Most Recent Value  Intake Tab  Referral Department  Hospitalist  Unit at Time of Referral  Med/Surg Unit  Palliative Care Primary Diagnosis  Sepsis/Infectious Disease  Palliative Care Type  New Palliative care  Reason for referral  Clarify Goals of Care  Date first seen by Palliative Care  03/19/17  Clinical Assessment  Palliative Performance Scale Score  40%  Psychosocial & Spiritual Assessment  Palliative Care Outcomes  Patient/Family meeting held?  Yes  Who was at the meeting?  daughter and patient's friend  Palliative  Care Outcomes  Clarified goals of care, Provided end of life care assistance, Provided psychosocial or spiritual support, ACP counseling assistance, Counseled regarding hospice      Time In/Out: 1310-1420 Time Total: 6mn Greater than 50%  of this time was spent counseling and coordinating care related to the above assessment and plan.  Signed by:  MIhor Dow FNP-C Palliative Medicine Team  Phone: [phone]: [phone]  Please contact Palliative Medicine Team phone at [phone] questions and concerns.  For individual provider: See AShea Evans

## 2017-03-19 NOTE — Consult Note (Signed)
WOC Nurse wound consult note Reason for Consult:legs and sacrum, multiple full thickness, partial thickness, DTI, unstageable areas Wound type:venous and arterial insufficiency, IAD, pressure  Pressure Injury POA: Yes Measurement: left leg: pretibial 6.2cm x 3cm x 0.1cm 80% yellow slough, Left anterior lower leg near malleolus, peeling epithelium:  8 cm x 6 cm x 0.2 cm,  1cm x 1cm x 0.1cm 100% pink, posterior left calf 4cm x 3cm x 0.2cm 50% slough, possible tendons exposed. Base of Left great toe 2cm x 2.8cm with black scab surrounded by darkened skin. Base of 4th toe, plantar side 0.6 round black area. All have scant serosanguinous drainage, malodorous Right leg: right upper anterial medial lower leg 9cm x 10cm x 0.1cm 90% white slough. Right pretibial has 4.5cm x 1.8cm x 0.1cm unstageable 100% yellow slough, 3cm x 1cm x 0.1cm 100% yellow slough. Right medial ankle open wound 12cm x 10cm x 0.1cm with no skin beefy red wound bed and a circle of completely dead black area surrounding distal area of wound. Posterior right leg 4cm x 3cm x 0.1cm, slough and pink. All draining scant amt of serosanguinous drainage, all malodorous. Right dorsal 2nd toe 2.5cm x 2cm hard shell of callous area with 0.5cm round black scab. Wound bed: Sacrum: 10cm x 2cm black DTI consistent with shear injury with multiple partial thickness wounds with red wound beds from IAD.  Drainage (amount, consistency, odor) see above Periwound: intact Dressing procedure/placement/frequency:Patient's legs are cold to touch, no palpable pulse, 2+ weeping edema. Vascular service has consulted.  Is poor candidate for intervention.  COntinue IV antibiotics and will implement Xeroform antimicrobial topical therapy.   Will not follow at this time.  Please re-consult if needed.  Tiffony Kite SandersMaple Hudson RN BSN CWON Pager [phone]

## 2017-03-19 NOTE — Progress Notes (Signed)
[facility] - [place] at [facility]   PATIENT NAME: Jeremy Gutierrez    MR#:  [id number]  DATE OF BIRTH:  03/25/1950  SUBJECTIVE:  CHIEF COMPLAINT: Pt  denies any complaints today.  No fever .  PICC line removed   REVIEW OF SYSTEMS:   Review of Systems  Constitutional: Negative for chills and fever.  Eyes: Negative for blurred vision.  Respiratory:  denies any cough and shortness of breath.   Cardiovascular: Negative for chest pain.  Reports history of endocarditis Gastrointestinal: Denies any constipation or diarrhea.  Denies any abdominal pain nausea vomiting  Genitourinary: Negative for dysuria.  Musculoskeletal: Negative for joint pain.  Neurological: Negative for dizziness and headaches.  Skin: Lower leg wounds      DRUG ALLERGIES:   Allergies  Allergen Reactions  . Sulfa Antibiotics Hives    Patient states he is not allergic to this medication    VITALS:  Blood pressure (!) 89/50, pulse 80, temperature 97.8 F (36.6 C), temperature source Oral, resp. rate 20, height 5\' 11"  (1.803 m), weight 96.8 kg (213 lb 8 oz), SpO2 97 %.  PHYSICAL EXAMINATION:  GENERAL:  67 y.o.-year-old patient lying in the bed with no acute distress.  EYES: Pupils equal, round, reactive to light and accommodation. No scleral icterus. Extraocular muscles intact.  HEENT: Head atraumatic, normocephalic. Oropharynx and nasopharynx clear.  NECK:  Supple, no jugular venous distention. No thyroid enlargement, no tenderness.  LUNGS: Normal breath sounds bilaterally, no wheezing, rales,rhonchi or crepitation. No use of accessory muscles of respiration.  CARDIOVASCULAR: S1, S2 normal. No murmurs, rubs, or gallops.  ABDOMEN: Soft, nontender, nondistended. Bowel sounds present. No organomegaly or mass.  EXTREMITIES: .  Bilateral lower extremity chronic ulceration with erythema, open wounds Left lower extremity cold to touch with a diminished pulses NEUROLOGIC: Arousable, follows  verbal commands, motor and sensory are intact.  Oriented x2-3 PSYCHIATRIC: The patient is arousable  sKIN: No obvious rash   LABORATORY PANEL:   CBC Recent Labs  Lab 03/19/17 1115  WBC 14.5*  HGB 14.4  HCT 43.7  PLT 160   ------------------------------------------------------------------------------------------------------------------  Chemistries  Recent Labs  Lab 03/15/17 0928  03/19/17 1115  NA 130*   < > 132*  K 5.2*   < > 3.8  CL 95*   < > 96*  CO2 22   < > 24  GLUCOSE 258*   < > 179*  BUN 99*   < > 69*  CREATININE 1.95*   < > 1.77*  CALCIUM 8.6*   < > 8.0*  AST 64*  --   --   ALT 61  --   --   ALKPHOS 148*  --   --   BILITOT 3.5*  --   --    < > = values in this interval not displayed.   ------------------------------------------------------------------------------------------------------------------  Cardiac Enzymes Recent Labs  Lab 03/15/17 0928  TROPONINI 0.05*   ------------------------------------------------------------------------------------------------------------------  RADIOLOGY:  No results found.  EKG:   Orders placed or performed during the hospital encounter of 03/15/17  . ED EKG 12-Lead  . ED EKG 12-Lead  . EKG 12-Lead  . EKG 12-Lead    ASSESSMENT AND PLAN:    67 year old male with recent diagnosis of endocarditis due to chronic lower extremity wounds currently on IV vancomycin followed by infectious diseasewho presents from [facility] with fevers and lethargy.  1. Sepsis: Patient presents with fever, tachypnea, tachycardia and  with hypotension at the time of admission Sepsis  is likely due to right lower extremity cellulitis and chronic wounds He also has PICC line which is removed January 27 and the tip was sent for culture , results are pending follow up with Dr. Sampson GoonFitzgerald  Continue antibiotics daptomycin and D/C  Zosyn.  Patient is started on levofloxacin and Flagyl   MRSA PCR is negative.  March 15, 2017  blood cultures has revealed Klebsiella oxytoca Urine culture with Klebsiella pneumonia  wound culture with KLEBSIELLA OXYTOCA and STENOTROPHOMONAS MALTOPHILIA .  Vascular surgery is recommending an angiogram once patient is clinically stable  MRI  Neg for  osteomyelitis    2 recent diagnosis of.  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 Sampson GoonFitzgerald  Patient will need cardiothoracic evaluation once the infection clears   3. Acute metabolic encephalopathy due to sepsis and underlying dementia  4. Diabetes: sliding scale and ADA diet  low-dose Lantus for now, sliding scale insulin Diabetes nurse consultation requested  5. Chronic diastolic heart failure with preserved ejection fraction: Patient also has history of severe mitral stenosis with mitral regurgitation with lower extremity edema  hold  torsemide in view of renal insufficiency Follow-up with [facility]ology, recommending TEE, await clinical improvement at this point  6. Acute on Chronic kidney disease stage III: Creatinine increased from baseline from sepsis with ATN.   Baseline creatinine 1.21; creatinine 2.0-1.72 -1.83--1.77   7BPH: Continue Flomax  8.  Sacral pressure ulcer: Continue wound care and reposition patient.  Wound care is following  osa-cpap  Poor prognosis palliative care is following.  Scheduled family meeting with all 3 children on Thursday     All the records are reviewed and case discussed with Care Management/Social Workerr. Management plans discussed with the patient, family and they are in agreement.  CODE STATUS:  fc  TOTAL TIME TAKING CARE OF THIS PATIENT: 36  minutes.   POSSIBLE D/C IN 2  DAYS, DEPENDING ON CLINICAL CONDITION.  Note: This dictation was prepared with Dragon dictation along with smaller phrase technology. Any transcriptional errors that result from this  process are unintentional.   Ramonita LabAruna Fannye Myer M.D on 03/19/2017 at 8:54 PM  Between 7am to 6pm - Pager - [phone] After 6pm go to [email or web address] - password [email or web address] [email or web address]  [facility]  Office  [phone]  CC: Primary care physician; Derwood KaplanEason, Ernest B, MD

## 2017-03-19 NOTE — Progress Notes (Signed)
[facility] CLINIC INFECTIOUS DISEASE PROGRESS NOTE Date of Admission:  03/15/2017     ID: Jeremy Gutierrez is a 67 y.o. male with sepsis   Active Problems:   Sepsis (HCC)   Cellulitis   Acute endocarditis   Acute on chronic diastolic congestive heart failure [facility])   Renal insufficiency   Palliative care by specialist   Goals of care, counseling/discussion   Subjective: A little more alert today. No fevers. Picc removed over weekend. Palliative care consult reviewed.   ROS  Unable to obtain  Medications:  Antibiotics Given (last 72 hours)    Date/Time Action Medication Dose Rate   03/16/17 2319 New Bag/Given   piperacillin-tazobactam (ZOSYN) IVPB 3.375 g 3.375 g 12.5 mL/hr   03/17/17 0832 New Bag/Given   piperacillin-tazobactam (ZOSYN) IVPB 3.375 g 3.375 g 12.5 mL/hr   03/17/17 1620 New Bag/Given   DAPTOmycin (CUBICIN) 685.5 mg in sodium chloride 0.9 % IVPB 685.5 mg 227.4 mL/hr   03/17/17 1846 New Bag/Given   piperacillin-tazobactam (ZOSYN) IVPB 3.375 g 3.375 g 12.5 mL/hr   03/18/17 0212 New Bag/Given   piperacillin-tazobactam (ZOSYN) IVPB 3.375 g 3.375 g 12.5 mL/hr   03/18/17 0824 New Bag/Given   piperacillin-tazobactam (ZOSYN) IVPB 3.375 g 3.375 g 12.5 mL/hr   03/18/17 1417 New Bag/Given   metroNIDAZOLE (FLAGYL) IVPB 500 mg 500 mg 100 mL/hr   03/18/17 1522 New Bag/Given   levofloxacin (LEVAQUIN) IVPB 750 mg 750 mg 100 mL/hr   03/18/17 1655 New Bag/Given   DAPTOmycin (CUBICIN) 685.5 mg in sodium chloride 0.9 % IVPB 685.5 mg 227.4 mL/hr   03/18/17 2209 New Bag/Given   metroNIDAZOLE (FLAGYL) IVPB 500 mg 500 mg 100 mL/hr   03/19/17 1028 New Bag/Given  [Patient needed IV]   metroNIDAZOLE (FLAGYL) IVPB 500 mg 500 mg 100 mL/hr   03/19/17 1607 New Bag/Given   DAPTOmycin (CUBICIN) 685.5 mg in sodium chloride 0.9 % IVPB 685.5 mg 227.4 mL/hr     . aspirin EC  81 mg Oral Daily  . bacitracin   Topical BID  . bacitracin   Topical Daily  . enoxaparin (LOVENOX) injection  40 mg  Subcutaneous Q24H  . Gerhardt's butt cream   Topical BID  . insulin aspart  0-5 Units Subcutaneous QHS  . insulin aspart  0-9 Units Subcutaneous TID WC  . insulin aspart  4 Units Subcutaneous TID WC  . montelukast  10 mg Oral Daily  . tamsulosin  0.4 mg Oral Daily  . torsemide  20 mg Oral Daily  . [START ON 04/14/2017] Vitamin D (Ergocalciferol)  50,000 Units Oral Q30 days    Objective: Vital signs in last 24 hours: Temp:  [97.9 F (36.6 C)-99.4 F (37.4 C)] 99.4 F (37.4 C) (01/28 1428) Pulse Rate:  [88-100] 97 (01/28 1428) Resp:  [13-20] 13 (01/28 0344) BP: (100-107)/(57-70) 101/64 (01/28 1428) SpO2:  [96 %-100 %] 96 % (01/28 1428) Weight:  [96.8 kg (213 lb 8 oz)] 96.8 kg (213 lb 8 oz) (01/28 0454) Constitutional:disheveled, alert HENT: anicteric Mouth/Throat: Oropharynx is clear and moist. No oropharyngeal exudate.  Cardiovascular: Normal rate, regular rhythm and normal heart sounds. 2/6 sm  Pulmonary/Chest: poor air movement Abdominal: Soft. Bowel sounds are normal. He exhibits no distension. There is no tenderness.  Lymphadenopathy: He has no cervical adenopathy.  Ext 1+ edema bil LE Neurological: confused Vascular - L foot is now warmer and less cyanotic appearing then on admission R foot 1+ DP pulse, foot distally is warm Skin: Bil legs wrapped Psychiatric: flat  affect   Lab Results Recent Labs    03/18/17 0427 03/19/17 1115  WBC 15.2* 14.5*  HGB 14.7 14.4  HCT 45.1 43.7  NA 135 132*  K 3.7 3.8  CL 100* 96*  CO2 25 24  BUN 74* 69*  CREATININE 1.83* 1.77*    Microbiology: Results for orders placed or performed during the hospital encounter of 03/15/17  Culture, blood (Routine x 2)     Status: Abnormal   Collection Time: 03/15/17  9:29 AM  Result Value Ref Range Status   Specimen Description   Final    BLOOD LEFT FA Performed at [facility], [address] [place], [place] [address]    Special Requests   Final    BOTTLES DRAWN AEROBIC AND  ANAEROBIC Blood Culture adequate volume Performed at [facility], [address] [place], [place] [address]    Culture  Setup Time   Final    GRAM NEGATIVE RODS IN BOTH AEROBIC AND ANAEROBIC BOTTLES CRITICAL RESULT CALLED TO, READ BACK BY AND VERIFIED WITH: MATT MCBANE AT 0307 ON 03/16/17 [facility].    Culture KLEBSIELLA OXYTOCA (A)  Final   Report Status 03/18/2017 FINAL  Final   Organism ID, Bacteria KLEBSIELLA OXYTOCA  Final      Susceptibility   Klebsiella oxytoca - MIC*    AMPICILLIN >=32 RESISTANT Resistant     CEFAZOLIN <=4 SENSITIVE Sensitive     CEFEPIME <=1 SENSITIVE Sensitive     CEFTAZIDIME <=1 SENSITIVE Sensitive     CEFTRIAXONE <=1 SENSITIVE Sensitive     CIPROFLOXACIN <=0.25 SENSITIVE Sensitive     GENTAMICIN <=1 SENSITIVE Sensitive     IMIPENEM <=0.25 SENSITIVE Sensitive     TRIMETH/SULFA <=20 SENSITIVE Sensitive     AMPICILLIN/SULBACTAM 16 INTERMEDIATE Intermediate     PIP/TAZO <=4 SENSITIVE Sensitive     Extended ESBL NEGATIVE Sensitive     * KLEBSIELLA OXYTOCA  Culture, blood (Routine x 2)     Status: Abnormal   Collection Time: 03/15/17  9:29 AM  Result Value Ref Range Status   Specimen Description   Final    BLOOD RIGHT ANTECUBITAL Performed at [facility], [address] [place], [place] [address]    Special Requests   Final    BOTTLES DRAWN AEROBIC AND ANAEROBIC Blood Culture results may not be optimal due to an excessive volume of blood received in culture bottles Performed at [facility], [address] [place], [place] [address]    Culture  Setup Time   Final    GRAM NEGATIVE RODS IN BOTH AEROBIC AND ANAEROBIC BOTTLES CRITICAL VALUE NOTED.  VALUE IS CONSISTENT WITH PREVIOUSLY REPORTED AND CALLED VALUE. Performed at [facility], [address] [place], [place] [address]    Culture (A)  Final    KLEBSIELLA OXYTOCA SUSCEPTIBILITIES PERFORMED ON PREVIOUS CULTURE WITHIN THE LAST 5 DAYS. Performed at [facility], [address] [place], [place] [address]    Report Status 03/19/2017 FINAL  Final  Urine culture     Status: Abnormal   Collection Time: 03/15/17  9:29 AM  Result Value Ref Range Status   Specimen Description   Final    URINE, CATHETERIZED Performed at [facility], [address] [place], [place] [address]    Special Requests   Final    NONE Performed at [facility], [address] [place], [place] [address]    Culture 70,000 COLONIES/mL KLEBSIELLA PNEUMONIAE (A)  Final   Report Status 03/18/2017 FINAL  Final   Organism ID, Bacteria KLEBSIELLA PNEUMONIAE (A)  Final      Susceptibility   Klebsiella pneumoniae - MIC*    AMPICILLIN >=32 RESISTANT Resistant     CEFAZOLIN <=4 SENSITIVE Sensitive     CEFTRIAXONE <=1 SENSITIVE Sensitive     CIPROFLOXACIN <=0.25 SENSITIVE Sensitive     GENTAMICIN <=1 SENSITIVE Sensitive     IMIPENEM <=0.25 SENSITIVE Sensitive     NITROFURANTOIN 64 INTERMEDIATE Intermediate     TRIMETH/SULFA <=20 SENSITIVE Sensitive     AMPICILLIN/SULBACTAM 4 SENSITIVE Sensitive     PIP/TAZO <=4 SENSITIVE Sensitive     Extended ESBL NEGATIVE Sensitive     * 70,000 COLONIES/mL KLEBSIELLA PNEUMONIAE  Blood Culture ID Panel (Reflexed)     Status: Abnormal   Collection Time: 03/15/17  9:29 AM  Result Value Ref Range Status   Enterococcus species NOT DETECTED NOT DETECTED Final   Vancomycin resistance NOT DETECTED NOT DETECTED Final   Listeria monocytogenes NOT DETECTED NOT DETECTED Final   Staphylococcus species NOT DETECTED NOT DETECTED Final   Staphylococcus aureus NOT DETECTED NOT DETECTED Final   Methicillin resistance NOT DETECTED NOT DETECTED Final   Streptococcus species NOT DETECTED NOT DETECTED Final   Streptococcus agalactiae NOT DETECTED NOT DETECTED Final   Streptococcus pneumoniae NOT DETECTED NOT DETECTED Final   Streptococcus pyogenes NOT DETECTED NOT DETECTED Final   Acinetobacter baumannii NOT DETECTED NOT DETECTED  Final   Enterobacteriaceae species DETECTED (A) NOT DETECTED Final    Comment: CRITICAL RESULT CALLED TO, READ BACK BY AND VERIFIED WITH: MATT MCBANE AT 0307 ON 03/16/17 [facility]. Enterobacteriaceae represent a large family of gram-negative bacteria, not a single organism.    Enterobacter cloacae complex NOT DETECTED NOT DETECTED Final   Escherichia coli NOT DETECTED NOT DETECTED Final   Klebsiella oxytoca DETECTED (A) NOT DETECTED Final    Comment: CRITICAL RESULT CALLED TO, READ BACK BY AND VERIFIED WITH: MATT MCBANE AT 0307 ON 03/16/17 [facility].    Klebsiella pneumoniae NOT DETECTED NOT DETECTED Final   Proteus species NOT DETECTED NOT DETECTED Final   Serratia marcescens NOT DETECTED NOT DETECTED Final   Carbapenem resistance NOT DETECTED NOT DETECTED Final   Haemophilus influenzae NOT DETECTED NOT DETECTED Final   Neisseria meningitidis NOT DETECTED NOT DETECTED Final   Pseudomonas aeruginosa NOT DETECTED NOT DETECTED Final   Candida albicans NOT DETECTED NOT DETECTED Final   Candida glabrata NOT DETECTED NOT DETECTED Final   Candida krusei NOT DETECTED NOT DETECTED Final   Candida parapsilosis NOT DETECTED NOT DETECTED Final   Candida tropicalis NOT DETECTED NOT DETECTED Final    Comment: Performed at [facility], [address] [place], [place] [address]  Aerobic/Anaerobic Culture (surgical/deep wound)     Status: None   Collection Time: 03/15/17 11:15 AM  Result Value Ref Range Status   Specimen Description   Final    LEG Performed at [facility], [address] [place], [place] [address]    Special Requests   Final    NONE Performed at [facility], [address] [place], [place] [address]    Gram Stain   Final    RARE WBC PRESENT, PREDOMINANTLY PMN ABUNDANT GRAM NEGATIVE RODS RARE GRAM POSITIVE COCCI    Culture   Final    ABUNDANT KLEBSIELLA OXYTOCA MODERATE STENOTROPHOMONAS MALTOPHILIA NO ANAEROBES ISOLATED Performed at [facility], [address] [place] [address] [place], [place] [address]  Report Status 03/19/2017 FINAL  Final   Organism ID, Bacteria STENOTROPHOMONAS MALTOPHILIA  Final      Susceptibility   Stenotrophomonas maltophilia - MIC*    LEVOFLOXACIN 0.25 SENSITIVE Sensitive     TRIMETH/SULFA <=20 SENSITIVE Sensitive     * MODERATE STENOTROPHOMONAS MALTOPHILIA  MRSA PCR Screening     Status: None   Collection Time: 03/15/17  6:30 PM  Result Value Ref Range Status   MRSA by PCR NEGATIVE NEGATIVE Final    Comment:        The GeneXpert MRSA Assay (FDA approved for NASAL specimens only), is one component of a comprehensive MRSA colonization surveillance program. It is not intended to diagnose MRSA infection nor to guide or monitor treatment for MRSA infections. Performed at [facility], [address], [place], [place] [address]     Studies/Results: No results found.  Assessment/Plan: 12.26  TEE  - Aortic valve: There was trivial regurgitation. - Mitral valve: There was a vegetation. The findings are consistent with moderate to severe stenosis. There was moderate to severe regurgitation. - Left atrium: The atrium was dilated. No evidence of thrombus in the appendage. - Right atrium: The atrium was dilated. - Atrial septum: Echo contrast study showed no right-to-left atrial level shunt, at baseline or with provocation. Echo contrast study showed no right-to-left atrial level shunt, at baseline or with provocation.  Impressions:  - Mitral regurgitation, consistent with endocarditis. There was a vegetation, consistent with endocarditis.  Assessment:   Jeremy Gutierrez is a 68 y.o. male with recent admission when he was found to have MSSA and CNS bacteremia with TEE showing MV veg, mod to severe MS and mod to severe MR.   Now with recurrent fevers and sepsis despite being on IV vancomycin as well as worsening of LE wounds. I suspect his wounds are source of infection  and will need further evaluation for debridement and also for possible vascular intervention. His L foot is very cool and cyanotic but has a DP pulse by doppler. He also has ARF with BUN 99 and cr 1.95. On otpt labs from 1/8 his Bun was 40 and cr 1.21. He has severe ulceration of R leg and mod of L leg as well as a sacral wound.  Xray does not show any bony involvement nor soft tissue air Jan 25 - BCX Klebsiella as well as wound cx. Fever down but wbc increased some.  Seen by vascular and not able to tolerate  angiography.   Jan 28 - fevers improved, more alert. PICC removed  Recommendations Cont daptomycin  Zosyn changed to levo and flagyl to cover Klebsiella, Stentotrophomonas and anaerobes. . Continue wound care and control of LE edema Cardiology, vascular and palliative notes reviewed and help appreciated.  Would hold on TEE at this point since being treated for Endocarditis already. Poor prognosis with endocarditis and now Gram negative sepsis.  Thank you very much for the consult. Will follow with you.  Mick Sell   03/19/2017, 4:42 PM

## 2017-03-20 LAB — CBC WITH DIFFERENTIAL/PLATELET
BASOS ABS: 0.1 10*3/uL (ref 0–0.1)
Basophils Relative: 1 %
Eosinophils Absolute: 0.2 10*3/uL (ref 0–0.7)
Eosinophils Relative: 2 %
HEMATOCRIT: 43.5 % (ref 40.0–52.0)
Hemoglobin: 14.5 g/dL (ref 13.0–18.0)
LYMPHS ABS: 1.3 10*3/uL (ref 1.0–3.6)
LYMPHS PCT: 10 %
MCH: 32.8 pg (ref 26.0–34.0)
MCHC: 33.3 g/dL (ref 32.0–36.0)
MCV: 98.4 fL (ref 80.0–100.0)
Monocytes Absolute: 0.8 10*3/uL (ref 0.2–1.0)
Monocytes Relative: 6 %
NEUTROS PCT: 81 %
Neutro Abs: 10.2 10*3/uL — ABNORMAL HIGH (ref 1.4–6.5)
Platelets: 176 10*3/uL (ref 150–440)
RBC: 4.42 MIL/uL (ref 4.40–5.90)
RDW: 18.3 % — ABNORMAL HIGH (ref 11.5–14.5)
WBC: 12.6 10*3/uL — AB (ref 3.8–10.6)

## 2017-03-20 LAB — BASIC METABOLIC PANEL
ANION GAP: 10 (ref 5–15)
BUN: 70 mg/dL — AB (ref 6–20)
CO2: 22 mmol/L (ref 22–32)
Calcium: 8.1 mg/dL — ABNORMAL LOW (ref 8.9–10.3)
Chloride: 99 mmol/L — ABNORMAL LOW (ref 101–111)
Creatinine, Ser: 2.08 mg/dL — ABNORMAL HIGH (ref 0.61–1.24)
GFR calc Af Amer: 36 mL/min — ABNORMAL LOW (ref >60)
GFR, EST NON AFRICAN AMERICAN: 31 mL/min — AB (ref >60)
GLUCOSE: 126 mg/dL — AB (ref 65–99)
POTASSIUM: 3.8 mmol/L (ref 3.5–5.1)
Sodium: 131 mmol/L — ABNORMAL LOW (ref 135–145)

## 2017-03-20 LAB — GLUCOSE, CAPILLARY
GLUCOSE-CAPILLARY: 109 mg/dL — AB (ref 65–99)
GLUCOSE-CAPILLARY: 148 mg/dL — AB (ref 65–99)
Glucose-Capillary: 123 mg/dL — ABNORMAL HIGH (ref 65–99)
Glucose-Capillary: 133 mg/dL — ABNORMAL HIGH (ref 65–99)

## 2017-03-20 MED ORDER — ZOLPIDEM TARTRATE 5 MG PO TABS
5.0000 mg | ORAL_TABLET | Freq: Once | ORAL | Status: AC
  Administered 2017-03-20: 5 mg via ORAL
  Filled 2017-03-20: qty 1

## 2017-03-20 NOTE — Plan of Care (Signed)
  Progressing Education: Knowledge of General Education information will improve 03/20/2017 1727 - Progressing by Kathreen CosierMalcolm, Xzaria Teo A, RN Health Behavior/Discharge Planning: Ability to manage health-related needs will improve 03/20/2017 1727 - Progressing by Kathreen CosierMalcolm, Kyia Rhude A, RN Clinical Measurements: Ability to maintain clinical measurements within normal limits will improve 03/20/2017 1727 - Progressing by Kathreen CosierMalcolm, Sachin Ferencz A, RN Will remain free from infection 03/20/2017 1727 - Progressing by Kathreen CosierMalcolm, Paislyn Domenico A, RN Diagnostic test results will improve 03/20/2017 1727 - Progressing by Kathreen CosierMalcolm, Takeya Marquis A, RN Respiratory complications will improve 03/20/2017 1727 - Progressing by Kathreen CosierMalcolm, Keyairra Kolinski A, RN Cardiovascular complication will be avoided 03/20/2017 1727 - Progressing by Kathreen CosierMalcolm, Micaila Ziemba A, RN Activity: Risk for activity intolerance will decrease 03/20/2017 1727 - Progressing by Kathreen CosierMalcolm, Brittnie Lewey A, RN Nutrition: Adequate nutrition will be maintained 03/20/2017 1727 - Progressing by Kathreen CosierMalcolm, Jackelyn Illingworth A, RN Coping: Level of anxiety will decrease 03/20/2017 1727 - Progressing by Kathreen CosierMalcolm, Chere Babson A, RN Elimination: Will not experience complications related to bowel motility 03/20/2017 1727 - Progressing by Kathreen CosierMalcolm, Caitlin Ainley A, RN Will not experience complications related to urinary retention 03/20/2017 1727 - Progressing by Kathreen CosierMalcolm, Dent Plantz A, RN Pain Managment: General experience of comfort will improve 03/20/2017 1727 - Progressing by Kathreen CosierMalcolm, Barri Neidlinger A, RN Safety: Ability to remain free from injury will improve 03/20/2017 1727 - Progressing by Kathreen CosierMalcolm, Schon Zeiders A, RN Skin Integrity: Risk for impaired skin integrity will decrease 03/20/2017 1727 - Progressing by Kathreen CosierMalcolm, Nikala Walsworth A, RN Education: Ability to describe self-care measures that may prevent or decrease complications (Diabetes Survival Skills Education) will improve 03/20/2017 1727 - Progressing by Kathreen CosierMalcolm, Cabell Lazenby A, RN Coping: Ability to adjust to  condition or change in health will improve 03/20/2017 1727 - Progressing by Kathreen CosierMalcolm, Kameron Blethen A, RN

## 2017-03-20 NOTE — Progress Notes (Signed)
LCSW continues to follow for disposition:  [facility]. Plan is for him to return when medically stable. [facility] and updated her on patient's current condition and plans.  Tresa EndoKelly reports patient is able to return at DC. Will continue to follow and assist with dc plans.  An[facility].   Deretha EmoryHannah Milik Gilreath LCSW, MSW Clinical Social Work: Optician, dispensingystem Wide Float Coverage for :  [phone]

## 2017-03-20 NOTE — Progress Notes (Addendum)
[facility] [place] [facility]   PATIENT NAME: Jeremy Gutierrez    MR#:  [id number]  DATE OF BIRTH:  09/24/1950  SUBJECTIVE:  CHIEF COMPLAINT: Pt  denies any complaints today.  No fever .   refused CPAP machine last night and sleeping today morning arousable but falling asleep  REVIEW OF SYSTEMS:   Review of Systems  Constitutional: Negative for chills and fever.  Eyes: Negative for blurred vision.  Respiratory:  denies any cough and shortness of breath.   Cardiovascular: Negative for chest pain.  Reports history of endocarditis Gastrointestinal: Denies any constipation or diarrhea.  Denies any abdominal pain nausea vomiting  Genitourinary: Negative for dysuria.  Musculoskeletal: Negative for joint pain.  Neurological: Negative for dizziness and headaches.  Skin: Lower leg wounds      DRUG ALLERGIES:   Allergies  Allergen Reactions  . Sulfa Antibiotics Hives    Patient states he is not allergic to this medication    VITALS:  Blood pressure 110/64, pulse 88, temperature 98.4 F (36.9 C), temperature source Oral, resp. rate 20, height 5\' 11"  (1.803 m), weight 96.4 kg (212 lb 8.4 oz), SpO2 99 %.  PHYSICAL EXAMINATION:  GENERAL:  67 y.o.-year-old patient lying in the bed with no acute distress.  EYES: Pupils equal, round, reactive to light and accommodation. No scleral icterus. Extraocular muscles intact.  HEENT: Head atraumatic, normocephalic. Oropharynx and nasopharynx clear.  NECK:  Supple, no jugular venous distention. No thyroid enlargement, no tenderness.  LUNGS: Normal breath sounds bilaterally, no wheezing, rales,rhonchi or crepitation. No use of accessory muscles of respiration.  CARDIOVASCULAR: S1, S2 normal. No murmurs, rubs, or gallops.  ABDOMEN: Soft, nontender, nondistended. Bowel sounds present. No organomegaly or mass.  EXTREMITIES: .  Bilateral lower extremity chronic ulceration with erythema, open wounds Left lower extremity cold  to touch with a diminished pulses NEUROLOGIC: Arousable, follows verbal commands, motor and sensory are intact.  Oriented x2-3 PSYCHIATRIC: The patient is arousable  sKIN: No obvious rash   LABORATORY PANEL:   CBC Recent Labs  Lab 03/20/17 1013  WBC 12.6*  HGB 14.5  HCT 43.5  PLT 176   ------------------------------------------------------------------------------------------------------------------  Chemistries  Recent Labs  Lab 03/15/17 0928  03/20/17 1013  NA 130*   < > 131*  K 5.2*   < > 3.8  CL 95*   < > 99*  CO2 22   < > 22  GLUCOSE 258*   < > 126*  BUN 99*   < > 70*  CREATININE 1.95*   < > 2.08*  CALCIUM 8.6*   < > 8.1*  AST 64*  --   --   ALT 61  --   --   ALKPHOS 148*  --   --   BILITOT 3.5*  --   --    < > = values in this interval not displayed.   ------------------------------------------------------------------------------------------------------------------  Cardiac Enzymes Recent Labs  Lab 03/15/17 0928  TROPONINI 0.05*   ------------------------------------------------------------------------------------------------------------------  RADIOLOGY:  No results found.  EKG:   Orders placed or performed during the hospital encounter of 03/15/17  . ED EKG 12-Lead  . ED EKG 12-Lead  . EKG 12-Lead  . EKG 12-Lead    ASSESSMENT AND PLAN:    67 year old male with recent diagnosis of endocarditis due to chronic lower extremity wounds currently on IV vancomycin followed by infectious diseasewho presents from [facility] with fevers and lethargy.  1. Sepsis: Patient presents with fever, tachypnea, tachycardia and  with hypotension at the time of admission Sepsis is likely due to right lower extremity cellulitis and chronic wounds He also has PICC line which is removed January 27 and the tip was sent for culture , results are pending follow up with Dr. Sampson Goon  Continue antibiotics daptomycin and D/C  Zosyn.  Patient is started on  levofloxacin and Flagyl   MRSA PCR is negative.  March 15, 2017 blood cultures has revealed Klebsiella oxytoca Urine culture with Klebsiella pneumonia  wound culture with KLEBSIELLA OXYTOCA and STENOTROPHOMONAS MALTOPHILIA .  Vascular surgery is recommending an angiogram once patient is clinically stable, discussed with Dr. Wyn Quaker today, he will see the patient and might consider an angiogram soon  MRI  Neg for  osteomyelitis    2 recent diagnosis of.  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 Sampson Goon  Patient will need cardiothoracic evaluation once the infection clears   3. Acute metabolic encephalopathy due to sepsis and underlying dementia  4. Diabetes: sliding scale and ADA diet  low-dose Lantus for now, sliding scale insulin Diabetes nurse consultation requested  5. Chronic diastolic heart failure with preserved ejection fraction: Patient also has history of severe mitral stenosis with mitral regurgitation with lower extremity edema  hold  torsemide in view of renal insufficiency Follow-up with [facility], recommending TEE, await clinical improvement at this point  6. Acute on Chronic kidney disease stage III: Creatinine increased from baseline from sepsis with ATN.   Baseline creatinine 1.21; creatinine 2.0-1.72 -1.83--1.77-2.08   7BPH: Continue Flomax  8.  Sacral pressure ulcer: Continue wound care and reposition patient.  Wound care is following  osa-cpap  Poor prognosis palliative care is following.  Scheduled family meeting with all 3 children on Thursday     All the records are reviewed and case discussed with Care Management/Social Workerr. Management plans discussed with the patient, family and they are in agreement.  CODE STATUS:  fc  TOTAL TIME TAKING CARE OF THIS PATIENT: 32  minutes.   POSSIBLE D/C IN 2  DAYS, DEPENDING ON CLINICAL  CONDITION.  Note: This dictation was prepared with Dragon dictation along with smaller phrase technology. Any transcriptional errors that result from this process are unintentional.   Ramonita Lab M.D on 03/20/2017 at 4:48 PM  Between 7am to 6pm - Pager - [phone] After 6pm go to [email or web address] - password [email or web address] [facility]  [place]  [facility]  Office  [phone]  CC: Primary care physician; Derwood Kaplan, MD

## 2017-03-20 NOTE — Progress Notes (Signed)
Prayed for Patient. 

## 2017-03-20 NOTE — Progress Notes (Signed)
[facility] Date of Admission:  03/15/2017     ID: Merideth AbbeyBarry L Gutierrez is a 67 y.o. male with sepsis   Active Problems:   Sepsis (HCC)   Cellulitis   Acute endocarditis   Acute on chronic diastolic congestive heart failure [facility](HCC)   Renal insufficiency   Palliative care by specialist   Goals of care, counseling/discussion   Subjective: A little more alert today. No fevers. Picc removed over weekend.  ROS  Unable to obtain  Medications:  Antibiotics Given (last 72 hours)    Date/Time Action Medication Dose Rate   03/17/17 1620 New Bag/Given   DAPTOmycin (CUBICIN) 685.5 mg in sodium chloride 0.9 % IVPB 685.5 mg 227.4 mL/hr   03/17/17 1846 New Bag/Given   piperacillin-tazobactam (ZOSYN) IVPB 3.375 g 3.375 g 12.5 mL/hr   03/18/17 0212 New Bag/Given   piperacillin-tazobactam (ZOSYN) IVPB 3.375 g 3.375 g 12.5 mL/hr   03/18/17 0824 New Bag/Given   piperacillin-tazobactam (ZOSYN) IVPB 3.375 g 3.375 g 12.5 mL/hr   03/18/17 1417 New Bag/Given   metroNIDAZOLE (FLAGYL) IVPB 500 mg 500 mg 100 mL/hr   03/18/17 1522 New Bag/Given   levofloxacin (LEVAQUIN) IVPB 750 mg 750 mg 100 mL/hr   03/18/17 1655 New Bag/Given   DAPTOmycin (CUBICIN) 685.5 mg in sodium chloride 0.9 % IVPB 685.5 mg 227.4 mL/hr   03/18/17 2209 New Bag/Given   metroNIDAZOLE (FLAGYL) IVPB 500 mg 500 mg 100 mL/hr   03/19/17 1028 New Bag/Given  [Patient needed IV]   metroNIDAZOLE (FLAGYL) IVPB 500 mg 500 mg 100 mL/hr   03/19/17 1607 New Bag/Given   DAPTOmycin (CUBICIN) 685.5 mg in sodium chloride 0.9 % IVPB 685.5 mg 227.4 mL/hr   03/19/17 1810 New Bag/Given   metroNIDAZOLE (FLAGYL) IVPB 500 mg 500 mg 100 mL/hr   03/20/17 0229 New Bag/Given   metroNIDAZOLE (FLAGYL) IVPB 500 mg 500 mg 100 mL/hr   03/20/17 1001 New Bag/Given   levofloxacin (LEVAQUIN) IVPB 750 mg 750 mg 100 mL/hr   03/20/17 1128 New Bag/Given   metroNIDAZOLE (FLAGYL) IVPB 500 mg 500 mg 100 mL/hr     . aspirin EC  81 mg Oral  Daily  . bacitracin   Topical BID  . bacitracin   Topical Daily  . enoxaparin (LOVENOX) injection  40 mg Subcutaneous Q24H  . Gerhardt's butt cream   Topical BID  . insulin aspart  0-5 Units Subcutaneous QHS  . insulin aspart  0-9 Units Subcutaneous TID WC  . insulin aspart  4 Units Subcutaneous TID WC  . montelukast  10 mg Oral Daily  . tamsulosin  0.4 mg Oral Daily  . [START ON 04/14/2017] Vitamin D (Ergocalciferol)  50,000 Units Oral Q30 days    Objective: Vital signs in last 24 hours: Temp:  [97.4 F (36.3 C)-97.8 F (36.6 C)] 97.4 F (36.3 C) (01/29 0524) Pulse Rate:  [80-82] 82 (01/29 0524) Resp:  [20] 20 (01/29 0524) BP: (89-101)/(50-61) 101/61 (01/29 0524) SpO2:  [97 %-99 %] 99 % (01/29 0524) Weight:  [96.4 kg (212 lb 8.4 oz)] 96.4 kg (212 lb 8.4 oz) (01/29 0524) Constitutional:disheveled, alert HENT: anicteric Mouth/Throat: Oropharynx is clear and moist. No oropharyngeal exudate.  Cardiovascular: Normal rate, regular rhythm and normal heart sounds. 2/6 sm  Pulmonary/Chest: poor air movement Abdominal: Soft. Bowel sounds are normal. He exhibits no distension. There is no tenderness.  Lymphadenopathy: He has no cervical adenopathy.  Ext 1+ edema bil LE Neurological: confused Vascular - L foot is now warmer and  less cyanotic appearing then on admission R foot 1+ DP pulse, foot distally is warm Skin: Bil legs wrapped Psychiatric: flat affect   Lab Results Recent Labs    03/19/17 1115 03/20/17 1013  WBC 14.5* 12.6*  HGB 14.4 14.5  HCT 43.7 43.5  NA 132* 131*  K 3.8 3.8  CL 96* 99*  CO2 24 22  BUN 69* 70*  CREATININE 1.77* 2.08*    Microbiology: Results for orders placed or performed during the hospital encounter of 03/15/17  Culture, blood (Routine x 2)     Status: Abnormal   Collection Time: 03/15/17  9:29 AM  Result Value Ref Range Status   Specimen Description   Final    BLOOD LEFT FA Performed at [facility], [address]  [place], [place] [address]    Special Requests   Final    BOTTLES DRAWN AEROBIC AND ANAEROBIC Blood Culture adequate volume Performed at [facility], [address], [place], [place] [address]    Culture  Setup Time   Final    GRAM NEGATIVE RODS IN BOTH AEROBIC AND ANAEROBIC BOTTLES CRITICAL RESULT CALLED TO, READ BACK BY AND VERIFIED WITH: MATT MCBANE AT 0307 ON 03/16/17 [facility].    Culture KLEBSIELLA OXYTOCA (A)  Final   Report Status 03/18/2017 FINAL  Final   Organism ID, Bacteria KLEBSIELLA OXYTOCA  Final      Susceptibility   Klebsiella oxytoca - MIC*    AMPICILLIN >=32 RESISTANT Resistant     CEFAZOLIN <=4 SENSITIVE Sensitive     CEFEPIME <=1 SENSITIVE Sensitive     CEFTAZIDIME <=1 SENSITIVE Sensitive     CEFTRIAXONE <=1 SENSITIVE Sensitive     CIPROFLOXACIN <=0.25 SENSITIVE Sensitive     GENTAMICIN <=1 SENSITIVE Sensitive     IMIPENEM <=0.25 SENSITIVE Sensitive     TRIMETH/SULFA <=20 SENSITIVE Sensitive     AMPICILLIN/SULBACTAM 16 INTERMEDIATE Intermediate     PIP/TAZO <=4 SENSITIVE Sensitive     Extended ESBL NEGATIVE Sensitive     * KLEBSIELLA OXYTOCA  Culture, blood (Routine x 2)     Status: Abnormal   Collection Time: 03/15/17  9:29 AM  Result Value Ref Range Status   Specimen Description   Final    BLOOD RIGHT ANTECUBITAL Performed at [facility], [address], [place], [place] [address]    Special Requests   Final    BOTTLES DRAWN AEROBIC AND ANAEROBIC Blood Culture results may not be optimal due to an excessive volume of blood received in culture bottles Performed at [facility], [address] [place], [place] [address]    Culture  Setup Time   Final    GRAM NEGATIVE RODS IN BOTH AEROBIC AND ANAEROBIC BOTTLES CRITICAL VALUE NOTED.  VALUE IS CONSISTENT WITH PREVIOUSLY REPORTED AND CALLED VALUE. Performed at [facility], [address] [place], [place] [address]    Culture (A)  Final    KLEBSIELLA OXYTOCA SUSCEPTIBILITIES  PERFORMED ON PREVIOUS CULTURE WITHIN THE LAST 5 DAYS. Performed at [facility], [address] [place], [place] [address]    Report Status 03/19/2017 FINAL  Final  Urine culture     Status: Abnormal   Collection Time: 03/15/17  9:29 AM  Result Value Ref Range Status   Specimen Description   Final    URINE, CATHETERIZED Performed at [facility], [address] [place], [place] [address]    Special Requests   Final    NONE Performed  at [facility], [address] [place], [place] [address]    Culture 70,000 COLONIES/mL KLEBSIELLA PNEUMONIAE (A)  Final   Report Status 03/18/2017 FINAL  Final   Organism ID, Bacteria KLEBSIELLA PNEUMONIAE (A)  Final      Susceptibility   Klebsiella pneumoniae - MIC*    AMPICILLIN >=32 RESISTANT Resistant     CEFAZOLIN <=4 SENSITIVE Sensitive     CEFTRIAXONE <=1 SENSITIVE Sensitive     CIPROFLOXACIN <=0.25 SENSITIVE Sensitive     GENTAMICIN <=1 SENSITIVE Sensitive     IMIPENEM <=0.25 SENSITIVE Sensitive     NITROFURANTOIN 64 INTERMEDIATE Intermediate     TRIMETH/SULFA <=20 SENSITIVE Sensitive     AMPICILLIN/SULBACTAM 4 SENSITIVE Sensitive     PIP/TAZO <=4 SENSITIVE Sensitive     Extended ESBL NEGATIVE Sensitive     * 70,000 COLONIES/mL KLEBSIELLA PNEUMONIAE  Blood Culture ID Panel (Reflexed)     Status: Abnormal   Collection Time: 03/15/17  9:29 AM  Result Value Ref Range Status   Enterococcus species NOT DETECTED NOT DETECTED Final   Vancomycin resistance NOT DETECTED NOT DETECTED Final   Listeria monocytogenes NOT DETECTED NOT DETECTED Final   Staphylococcus species NOT DETECTED NOT DETECTED Final   Staphylococcus aureus NOT DETECTED NOT DETECTED Final   Methicillin resistance NOT DETECTED NOT DETECTED Final   Streptococcus species NOT DETECTED NOT DETECTED Final   Streptococcus agalactiae NOT DETECTED NOT DETECTED Final   Streptococcus pneumoniae NOT DETECTED NOT DETECTED Final   Streptococcus pyogenes NOT  DETECTED NOT DETECTED Final   Acinetobacter baumannii NOT DETECTED NOT DETECTED Final   Enterobacteriaceae species DETECTED (A) NOT DETECTED Final    Comment: CRITICAL RESULT CALLED TO, READ BACK BY AND VERIFIED WITH: MATT MCBANE AT 0307 ON 03/16/17 [facility]. Enterobacteriaceae represent a large family of gram-negative bacteria, not a single organism.    Enterobacter cloacae complex NOT DETECTED NOT DETECTED Final   Escherichia coli NOT DETECTED NOT DETECTED Final   Klebsiella oxytoca DETECTED (A) NOT DETECTED Final    Comment: CRITICAL RESULT CALLED TO, READ BACK BY AND VERIFIED WITH: MATT MCBANE AT 0307 ON 03/16/17 [facility].    Klebsiella pneumoniae NOT DETECTED NOT DETECTED Final   Proteus species NOT DETECTED NOT DETECTED Final   Serratia marcescens NOT DETECTED NOT DETECTED Final   Carbapenem resistance NOT DETECTED NOT DETECTED Final   Haemophilus influenzae NOT DETECTED NOT DETECTED Final   Neisseria meningitidis NOT DETECTED NOT DETECTED Final   Pseudomonas aeruginosa NOT DETECTED NOT DETECTED Final   Candida albicans NOT DETECTED NOT DETECTED Final   Candida glabrata NOT DETECTED NOT DETECTED Final   Candida krusei NOT DETECTED NOT DETECTED Final   Candida parapsilosis NOT DETECTED NOT DETECTED Final   Candida tropicalis NOT DETECTED NOT DETECTED Final    Comment: Performed at [facility], [address] [place], [place] [address]  Aerobic/Anaerobic Culture (surgical/deep wound)     Status: None   Collection Time: 03/15/17 11:15 AM  Result Value Ref Range Status   Specimen Description   Final    LEG Performed at [facility], [address], [place], [place] [address]    Special Requests   Final    NONE Performed at [facility], [address] [place], [place] [address]    Gram Stain   Final    RARE WBC PRESENT, PREDOMINANTLY PMN ABUNDANT GRAM NEGATIVE RODS RARE GRAM POSITIVE COCCI    Culture   Final    ABUNDANT KLEBSIELLA OXYTOCA MODERATE  STENOTROPHOMONAS MALTOPHILIA NO ANAEROBES ISOLATED Performed at [facility], [address], [place], [place] [address]    Report Status 03/19/2017 FINAL  Final   Organism ID, Bacteria STENOTROPHOMONAS MALTOPHILIA  Final      Susceptibility   Stenotrophomonas maltophilia - MIC*    LEVOFLOXACIN 0.25 SENSITIVE Sensitive     TRIMETH/SULFA <=20 SENSITIVE Sensitive     * MODERATE STENOTROPHOMONAS MALTOPHILIA  MRSA PCR Screening     Status: None   Collection Time: 03/15/17  6:30 PM  Result Value Ref Range Status   MRSA by PCR NEGATIVE NEGATIVE Final    Comment:        The GeneXpert MRSA Assay (FDA approved for NASAL specimens only), is one component of a comprehensive MRSA colonization surveillance program. It is not intended to diagnose MRSA infection nor to guide or monitor treatment for MRSA infections. Performed at [facility], [address], [place], [place] [address]   Cath Tip Culture     Status: None (Preliminary result)   Collection Time: 03/18/17  2:15 PM  Result Value Ref Range Status   Specimen Description   Final    CATH TIP Performed at [facility] [address], [place], [place] [address]    Special Requests   Final    NONE Performed at [facility], [address], [place], [place] [address]    Culture   Final    NO GROWTH 1 DAY Performed at [facility], [address], [place], [place] [address]    Report Status PENDING  Incomplete    Studies/Results: No results found.  Assessment/Plan: 12.26  TEE  - Aortic valve: There was trivial regurgitation. - Mitral valve: There was a vegetation. The findings are consistent with moderate to severe stenosis. There was moderate to severe regurgitation. - Left atrium: The atrium was dilated. No evidence of thrombus in the appendage. - Right atrium: The atrium was dilated. - Atrial septum: Echo contrast study showed no right-to-left atrial level shunt,  at baseline or with provocation. Echo contrast study showed no right-to-left atrial level shunt, at baseline or with provocation.  Impressions:  - Mitral regurgitation, consistent with endocarditis. There was a vegetation, consistent with endocarditis.  Assessment:   Jeremy Gutierrez is a 67 y.o. male with recent admission when he was found to have MSSA and CNS bacteremia with TEE showing MV veg, mod to severe MS and mod to severe MR.   Now with recurrent fevers and sepsis despite being on IV vancomycin as well as worsening of LE wounds. I suspect his wounds are source of infection and will need further evaluation for debridement and also for possible vascular intervention. His L foot is very cool and cyanotic but has a DP pulse by doppler. He also has ARF with BUN 99 and cr 1.95. On otpt labs from 1/8 his Bun was 40 and cr 1.21. He has severe ulceration of R leg and mod of L leg as well as a sacral wound.  Xray does not show any bony involvement nor soft tissue air Jan 25 - BCX Klebsiella as well as wound cx. Fever down but wbc increased some.  Seen by vascular and not able to tolerate  angiography.  Jan 28 - fevers improved, more alert. PICC removed Jan 29 - no fever, wbc down to 12. 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 Marland Kitchen  Continue wound care and control of LE edema Cardiology, vascular and palliative notes reviewed and help appreciated.  Would hold on TEE at this point since being treated for Endocarditis already. Poor prognosis with endocarditis and now Gram negative sepsis.  Thank you very much for the consult. Will follow with you.  Mick Sell   03/20/2017, 2:46 PM

## 2017-03-20 NOTE — Care Management Important Message (Signed)
Important Message  Patient Details  Name: Jeremy AbbeyBarry L Gutierrez MRN: [id number] Date of Birth: 09/17/1950   Medicare Important Message Given:  Yes  Telephone conversation with daughter, [name]    [name], RN 03/20/2017, 11:21 AM

## 2017-03-20 NOTE — Progress Notes (Signed)
Dr Gwen PoundsKowalski notified that patient had 5 beat run of V Tach and is now normal sinus. MD acknowledged, no new orders given.

## 2017-03-20 NOTE — Progress Notes (Addendum)
Dr Amado CoeGouru notified that patient had 5 beat run of V Tach. Patient is normal sinus now. MD order to notify Dr Juliann Paresallwood.

## 2017-03-20 NOTE — Progress Notes (Signed)
Inpatient Diabetes Program Recommendations  AACE/ADA: New Consensus Statement on Inpatient Glycemic Control (2015)  Target Ranges:  Prepandial:   less than 140 mg/dL      Peak postprandial:   less than 180 mg/dL (1-2 hours)      Critically ill patients:  140 - 180 mg/dL   Lab Results  Component Value Date   GLUCAP 133 (H) 03/20/2017   HGBA1C 9.8 (H) 02/09/2017    Review of Glycemic Control Results for Jeremy Gutierrez, Jeremy Gutierrez (MRN [id number]) as of 03/20/2017 12:24  Ref. Range 03/19/2017 11:39 03/19/2017 16:55 03/19/2017 22:41 03/20/2017 07:47 03/20/2017 11:48  Glucose-Capillary Latest Ref Range: 65 - 99 mg/dL 409178 (H) 811168 (H) 914101 (H) 123 (H) 133 (H)   Home DM Meds: Humalog 75/25 Insulin- 15 units BID  Current Insulin Orders: Novolog Sensitive Correction Scale/ SSI (0-9 units) TID, Novolog 0-5 units qhs                                       Novolog 4 units TID  Consider decreasing mealtime Novolog to 2 units TID with meals (hold if pt eats <50% of meal)  Susette RacerJulie Makayla Lanter, RN, BA, [place], CDE Diabetes Coordinator Inpatient Diabetes Program  [phone] (Team Pager) [phone] [facility] Office) 03/20/2017 12:27 PM

## 2017-03-21 DIAGNOSIS — I70248 Atherosclerosis of native arteries of left leg with ulceration of other part of lower left leg: Secondary | ICD-10-CM

## 2017-03-21 LAB — CBC WITH DIFFERENTIAL/PLATELET
BASOS ABS: 0.3 10*3/uL — AB (ref 0–0.1)
Basophils Relative: 3 %
EOS ABS: 0.2 10*3/uL (ref 0–0.7)
EOS PCT: 2 %
HCT: 43.6 % (ref 40.0–52.0)
Hemoglobin: 14.3 g/dL (ref 13.0–18.0)
Lymphocytes Relative: 6 %
Lymphs Abs: 0.7 10*3/uL — ABNORMAL LOW (ref 1.0–3.6)
MCH: 32.6 pg (ref 26.0–34.0)
MCHC: 32.7 g/dL (ref 32.0–36.0)
MCV: 99.5 fL (ref 80.0–100.0)
Monocytes Absolute: 0.6 10*3/uL (ref 0.2–1.0)
Monocytes Relative: 5 %
Neutro Abs: 9.2 10*3/uL — ABNORMAL HIGH (ref 1.4–6.5)
Neutrophils Relative %: 84 %
PLATELETS: 181 10*3/uL (ref 150–440)
RBC: 4.38 MIL/uL — AB (ref 4.40–5.90)
RDW: 18.5 % — ABNORMAL HIGH (ref 11.5–14.5)
WBC: 11 10*3/uL — AB (ref 3.8–10.6)

## 2017-03-21 LAB — BASIC METABOLIC PANEL
Anion gap: 14 (ref 5–15)
BUN: 70 mg/dL — AB (ref 6–20)
CO2: 18 mmol/L — ABNORMAL LOW (ref 22–32)
Calcium: 8 mg/dL — ABNORMAL LOW (ref 8.9–10.3)
Chloride: 99 mmol/L — ABNORMAL LOW (ref 101–111)
Creatinine, Ser: 1.93 mg/dL — ABNORMAL HIGH (ref 0.61–1.24)
GFR calc Af Amer: 39 mL/min — ABNORMAL LOW (ref >60)
GFR, EST NON AFRICAN AMERICAN: 34 mL/min — AB (ref >60)
Glucose, Bld: 262 mg/dL — ABNORMAL HIGH (ref 65–99)
POTASSIUM: 4.2 mmol/L (ref 3.5–5.1)
SODIUM: 131 mmol/L — AB (ref 135–145)

## 2017-03-21 LAB — GLUCOSE, CAPILLARY
GLUCOSE-CAPILLARY: 245 mg/dL — AB (ref 65–99)
GLUCOSE-CAPILLARY: 202 mg/dL — AB (ref 65–99)
GLUCOSE-CAPILLARY: 189 mg/dL — AB (ref 65–99)
Glucose-Capillary: 279 mg/dL — ABNORMAL HIGH (ref 65–99)

## 2017-03-21 MED ORDER — SODIUM CHLORIDE 0.9 % IV SOLN
INTRAVENOUS | Status: DC
  Administered 2017-03-21 – 2017-03-22 (×2): via INTRAVENOUS

## 2017-03-21 MED ORDER — SODIUM CHLORIDE 0.9 % IV SOLN
Freq: Once | INTRAVENOUS | Status: AC
  Administered 2017-03-21: 15:00:00 via INTRAVENOUS

## 2017-03-21 MED ORDER — ADULT MULTIVITAMIN W/MINERALS CH: 1.0000 | ORAL_TABLET | Freq: Every day | ORAL | Status: DC

## 2017-03-21 MED ORDER — DOCUSATE SODIUM 100 MG PO CAPS
100.0000 mg | ORAL_CAPSULE | Freq: Two times a day (BID) | ORAL | Status: DC
  Administered 2017-03-21 (×2): 100 mg via ORAL
  Filled 2017-03-21 (×2): qty 1

## 2017-03-21 MED ORDER — METHYLPREDNISOLONE SODIUM SUCC 125 MG IJ SOLR
80.0000 mg | Freq: Three times a day (TID) | INTRAMUSCULAR | Status: DC
  Administered 2017-03-22 (×2): 80 mg via INTRAVENOUS
  Filled 2017-03-21 (×2): qty 2

## 2017-03-21 MED ORDER — METHYLPREDNISOLONE SODIUM SUCC 125 MG IJ SOLR
125.0000 mg | Freq: Once | INTRAMUSCULAR | Status: AC
  Administered 2017-03-21: 125 mg via INTRAVENOUS
  Filled 2017-03-21: qty 2

## 2017-03-21 MED ORDER — INSULIN GLARGINE 100 UNIT/ML ~~LOC~~ SOLN
5.0000 [IU] | Freq: Every day | SUBCUTANEOUS | Status: DC
  Administered 2017-03-21 – 2017-03-22 (×2): 5 [IU] via SUBCUTANEOUS
  Filled 2017-03-21 (×3): qty 0.05

## 2017-03-21 NOTE — Progress Notes (Signed)
[facility]  ROUNDING NOTE   Subjective:   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   Patient was admitted to [facility] on 03/15/2017 for Open wound [T14.8XXA] Hyponatremia [E87.1] Acute renal insufficiency [N28.9] Elevated troponin [R74.8] Sepsis, due to unspecified organism (HCC) [A41.9] Cellulitis, unspecified cellulitis site [L03.90]  Patient with endocarditis and blood cultures with stenotrophamonas and klebsiella. Patient is encephalopathic. Does not seem to recognize me, who has been following patient for 4-5 years.   Consulted due to patient scheduled to have vascular procedure. Creatinine close to baseline, fluctuates from 1.8-3 as outpatient.   Objective:  Vital signs in last 24 hours:  Temp:  [97.7 F (36.5 C)-98.4 F (36.9 C)] 97.7 F (36.5 C) (01/30 1957) Pulse Rate:  [85-101] 85 (01/30 1957) Resp:  [13] 13 (01/30 1957) BP: (95-111)/(54-63) 95/54 (01/30 1957) SpO2:  [98 %-100 %] 100 % (01/30 1957) Weight:  [96.6 kg (212 lb 14.4 oz)] 96.6 kg (212 lb 14.4 oz) (01/30 0306)  Weight change: 0.171 kg (6 oz) Filed Weights   03/19/17 0454 03/20/17 0524 03/21/17 0306  Weight: 96.8 kg (213 lb 8 oz) 96.4 kg (212 lb 8.4 oz) 96.6 kg (212 lb 14.4 oz)    Intake/Output: I/O last 3 completed shifts: In: 1473.6 [P.O.:480; I.V.:102.5; IV Piggyback:891.1] Out: 1850 [Urine:1850]   Intake/Output this shift:  No intake/output data recorded.  Physical Exam: General: Ill appearing  Head: Normocephalic, atraumatic. Moist oral mucosal membranes  Eyes: Anicteric, PERRL  Neck: Supple, trachea midline  Lungs:  Clear to auscultation  Heart: Regular rate and rhythm  Abdomen:  Soft, nontender  Extremities: + peripheral edema. Bilateral lower extremity dressings  Neurologic: Confused.   Skin: No lesions       Basic Metabolic Panel: Recent Labs  Lab  03/17/17 0401 03/18/17 0427 03/19/17 1115 03/20/17 1013 03/21/17 0705  NA 132* 135 132* 131* 131*  K 3.9 3.7 3.8 3.8 4.2  CL 99* 100* 96* 99* 99*  CO2 24 25 24 22  18*  GLUCOSE 227* 171* 179* 126* 262*  BUN 81* 74* 69* 70* 70*  CREATININE 1.72* 1.83* 1.77* 2.08* 1.93*  CALCIUM 7.8* 8.2* 8.0* 8.1* 8.0*    Liver Function Tests: Recent Labs  Lab 03/15/17 0928  AST 64*  ALT 61  ALKPHOS 148*  BILITOT 3.5*  PROT 7.2  ALBUMIN 2.4*   No results for input(s): LIPASE, AMYLASE in the last 168 hours. Recent Labs  Lab 03/15/17 0954  AMMONIA 22    CBC: Recent Labs  Lab 03/15/17 0928  03/17/17 0401 03/18/17 0427 03/19/17 1115 03/20/17 1013 03/21/17 0705  WBC 13.8*   < > 14.5* 15.2* 14.5* 12.6* 11.0*  NEUTROABS 12.3*  --   --  12.6* 12.2* 10.2* 9.2*  HGB 15.6   < > 14.2 14.7 14.4 14.5 14.3  HCT 46.8   < > 43.5 45.1 43.7 43.5 43.6  MCV 98.1   < > 98.0 98.8 97.7 98.4 99.5  PLT 150   < > 153 150 160 176 181   < > = values in this interval not displayed.    Cardiac Enzymes: Recent Labs  Lab 03/15/17 0928 03/15/17 0946 03/17/17 0401  CKTOTAL  --  463* 109  TROPONINI 0.05*  --   --     BNP: Invalid input(s): POCBNP  CBG: Recent Labs  Lab 03/20/17 1648 03/20/17 2119 03/21/17 0748 03/21/17 1128 03/21/17 1712  GLUCAP 109* 148* 245* 279* 202*    Microbiology: Results for orders placed or performed during the hospital encounter of 03/15/17  Culture, blood (Routine x 2)     Status: Abnormal   Collection Time: 03/15/17  9:29 AM  Result Value Ref Range Status   Specimen Description   Final    BLOOD LEFT FA Performed at [facility], [address] [place], [place] [address]    Special Requests   Final    BOTTLES DRAWN AEROBIC AND ANAEROBIC Blood Culture adequate volume Performed at [facility], [address] [place], [place] [address]    Culture  Setup Time   Final    GRAM NEGATIVE RODS IN BOTH AEROBIC AND ANAEROBIC BOTTLES CRITICAL  RESULT CALLED TO, READ BACK BY AND VERIFIED WITH: MATT MCBANE AT 0307 ON 03/16/17 [facility].    Culture KLEBSIELLA OXYTOCA (A)  Final   Report Status 03/18/2017 FINAL  Final   Organism ID, Bacteria KLEBSIELLA OXYTOCA  Final      Susceptibility   Klebsiella oxytoca - MIC*    AMPICILLIN >=32 RESISTANT Resistant     CEFAZOLIN <=4 SENSITIVE Sensitive     CEFEPIME <=1 SENSITIVE Sensitive     CEFTAZIDIME <=1 SENSITIVE Sensitive     CEFTRIAXONE <=1 SENSITIVE Sensitive     CIPROFLOXACIN <=0.25 SENSITIVE Sensitive     GENTAMICIN <=1 SENSITIVE Sensitive     IMIPENEM <=0.25 SENSITIVE Sensitive     TRIMETH/SULFA <=20 SENSITIVE Sensitive     AMPICILLIN/SULBACTAM 16 INTERMEDIATE Intermediate     PIP/TAZO <=4 SENSITIVE Sensitive     Extended ESBL NEGATIVE Sensitive     * KLEBSIELLA OXYTOCA  Culture, blood (Routine x 2)     Status: Abnormal   Collection Time: 03/15/17  9:29 AM  Result Value Ref Range Status   Specimen Description   Final    BLOOD RIGHT ANTECUBITAL Performed at [facility], [address] [place], [place] [address]    Special Requests   Final    BOTTLES DRAWN AEROBIC AND ANAEROBIC Blood Culture results may not be optimal due to an excessive volume of blood received in culture bottles Performed at [facility], [address] [place], [place] [address]    Culture  Setup Time   Final    GRAM NEGATIVE RODS IN BOTH AEROBIC AND ANAEROBIC BOTTLES CRITICAL VALUE NOTED.  VALUE IS CONSISTENT WITH PREVIOUSLY REPORTED AND CALLED VALUE. Performed at [facility], [address] [place], [place] [address]    Culture (A)  Final    KLEBSIELLA OXYTOCA SUSCEPTIBILITIES PERFORMED ON PREVIOUS CULTURE WITHIN THE LAST 5 DAYS. Performed at [facility], [address] [place], [place] [address]    Report Status 03/19/2017 FINAL  Final  Urine culture     Status: Abnormal   Collection Time: 03/15/17  9:29 AM  Result Value Ref Range Status   Specimen Description    Final    URINE, CATHETERIZED Performed at [facility], [address] [place], [place] [address]    Special Requests   Final    NONE Performed at [facility], [address] [place], [place] [address]    Culture 70,000 COLONIES/mL KLEBSIELLA PNEUMONIAE (A)  Final   Report Status 03/18/2017 FINAL  Final   Organism ID, Bacteria KLEBSIELLA PNEUMONIAE (A)  Final      Susceptibility   Klebsiella pneumoniae - MIC*    AMPICILLIN >=32 RESISTANT Resistant     CEFAZOLIN <=4  SENSITIVE Sensitive     CEFTRIAXONE <=1 SENSITIVE Sensitive     CIPROFLOXACIN <=0.25 SENSITIVE Sensitive     GENTAMICIN <=1 SENSITIVE Sensitive     IMIPENEM <=0.25 SENSITIVE Sensitive     NITROFURANTOIN 64 INTERMEDIATE Intermediate     TRIMETH/SULFA <=20 SENSITIVE Sensitive     AMPICILLIN/SULBACTAM 4 SENSITIVE Sensitive     PIP/TAZO <=4 SENSITIVE Sensitive     Extended ESBL NEGATIVE Sensitive     * 70,000 COLONIES/mL KLEBSIELLA PNEUMONIAE  Blood Culture ID Panel (Reflexed)     Status: Abnormal   Collection Time: 03/15/17  9:29 AM  Result Value Ref Range Status   Enterococcus species NOT DETECTED NOT DETECTED Final   Vancomycin resistance NOT DETECTED NOT DETECTED Final   Listeria monocytogenes NOT DETECTED NOT DETECTED Final   Staphylococcus species NOT DETECTED NOT DETECTED Final   Staphylococcus aureus NOT DETECTED NOT DETECTED Final   Methicillin resistance NOT DETECTED NOT DETECTED Final   Streptococcus species NOT DETECTED NOT DETECTED Final   Streptococcus agalactiae NOT DETECTED NOT DETECTED Final   Streptococcus pneumoniae NOT DETECTED NOT DETECTED Final   Streptococcus pyogenes NOT DETECTED NOT DETECTED Final   Acinetobacter baumannii NOT DETECTED NOT DETECTED Final   Enterobacteriaceae species DETECTED (A) NOT DETECTED Final    Comment: CRITICAL RESULT CALLED TO, READ BACK BY AND VERIFIED WITH: MATT MCBANE AT 0307 ON 03/16/17 [facility]. Enterobacteriaceae represent a large family of  gram-negative bacteria, not a single organism.    Enterobacter cloacae complex NOT DETECTED NOT DETECTED Final   Escherichia coli NOT DETECTED NOT DETECTED Final   Klebsiella oxytoca DETECTED (A) NOT DETECTED Final    Comment: CRITICAL RESULT CALLED TO, READ BACK BY AND VERIFIED WITH: MATT MCBANE AT 0307 ON 03/16/17 [facility].    Klebsiella pneumoniae NOT DETECTED NOT DETECTED Final   Proteus species NOT DETECTED NOT DETECTED Final   Serratia marcescens NOT DETECTED NOT DETECTED Final   Carbapenem resistance NOT DETECTED NOT DETECTED Final   Haemophilus influenzae NOT DETECTED NOT DETECTED Final   Neisseria meningitidis NOT DETECTED NOT DETECTED Final   Pseudomonas aeruginosa NOT DETECTED NOT DETECTED Final   Candida albicans NOT DETECTED NOT DETECTED Final   Candida glabrata NOT DETECTED NOT DETECTED Final   Candida krusei NOT DETECTED NOT DETECTED Final   Candida parapsilosis NOT DETECTED NOT DETECTED Final   Candida tropicalis NOT DETECTED NOT DETECTED Final    Comment: Performed at [facility], [address], [place], [place] [address]  Aerobic/Anaerobic Culture (surgical/deep wound)     Status: None   Collection Time: 03/15/17 11:15 AM  Result Value Ref Range Status   Specimen Description   Final    LEG Performed at [facility], [address], [place], [place] [address]    Special Requests   Final    NONE Performed at [facility], [address] [place], [place] [address]    Gram Stain   Final    RARE WBC PRESENT, PREDOMINANTLY PMN ABUNDANT GRAM NEGATIVE RODS RARE GRAM POSITIVE COCCI    Culture   Final    ABUNDANT KLEBSIELLA OXYTOCA MODERATE STENOTROPHOMONAS MALTOPHILIA NO ANAEROBES ISOLATED Performed at [facility], [address] [place], [place] [address]    Report Status 03/19/2017 FINAL  Final   Organism ID, Bacteria STENOTROPHOMONAS MALTOPHILIA  Final      Susceptibility   Stenotrophomonas maltophilia - MIC*     LEVOFLOXACIN 0.25 SENSITIVE Sensitive     TRIMETH/SULFA <=20 SENSITIVE Sensitive     *  MODERATE STENOTROPHOMONAS MALTOPHILIA  MRSA PCR Screening     Status: None   Collection Time: 03/15/17  6:30 PM  Result Value Ref Range Status   MRSA by PCR NEGATIVE NEGATIVE Final    Comment:        The GeneXpert MRSA Assay (FDA approved for NASAL specimens only), is one component of a comprehensive MRSA colonization surveillance program. It is not intended to diagnose MRSA infection nor to guide or monitor treatment for MRSA infections. Performed at [facility], [address] [place], [place] [address]   Cath Tip Culture     Status: None (Preliminary result)   Collection Time: 03/18/17  2:15 PM  Result Value Ref Range Status   Specimen Description   Final    CATH TIP Performed at [facility], [address], [place], [place] [address]    Special Requests   Final    NONE Performed at [facility], [address], [place], [place] [address]    Culture   Final    NO GROWTH 2 DAYS Performed at [facility], [address] [place], [place] [address]    Report Status PENDING  Incomplete    Coagulation Studies: No results for input(s): LABPROT, INR in the last 72 hours.  Urinalysis: No results for input(s): COLORURINE, LABSPEC, PHURINE, GLUCOSEU, HGBUR, BILIRUBINUR, KETONESUR, PROTEINUR, UROBILINOGEN, NITRITE, LEUKOCYTESUR in the last 72 hours.  Invalid input(s): APPERANCEUR    Imaging: No results found.   Medications:   . sodium chloride 75 mL/hr at 03/21/17 1649  . DAPTOmycin (CUBICIN)  IV Stopped (03/21/17 1811)  . levofloxacin (LEVAQUIN) IV Stopped (03/20/17 1126)  . metronidazole Stopped (03/21/17 1922)   . aspirin EC  81 mg Oral Daily  . bacitracin   Topical BID  . bacitracin   Topical Daily  . docusate sodium  100 mg Oral BID  . enoxaparin (LOVENOX) injection  40 mg Subcutaneous Q24H  . Gerhardt's butt cream   Topical BID  .  insulin aspart  0-5 Units Subcutaneous QHS  . insulin aspart  0-9 Units Subcutaneous TID WC  . insulin aspart  4 Units Subcutaneous TID WC  . insulin glargine  5 Units Subcutaneous Daily  . [START ON 03/22/2017] methylPREDNISolone (SOLU-MEDROL) injection  80 mg Intravenous Q8H  . montelukast  10 mg Oral Daily  . [START ON 03/22/2017] multivitamin with minerals  1 tablet Oral Daily  . tamsulosin  0.4 mg Oral Daily  . [START ON 04/14/2017] Vitamin D (Ergocalciferol)  50,000 Units Oral Q30 days   acetaminophen **OR** acetaminophen, bisacodyl, diphenhydrAMINE, ondansetron **OR** ondansetron (ZOFRAN) IV, polyethylene glycol, traMADol  Assessment/ Plan:  [name] is a 67 y.o. black male cardiomyopathy, congestive heart failure, coronary artery disease, insulin dependent diabetes mellitus type 2, hyperlipidemia, hypertension, obstructive sleep apnea, peripheral vascular disease, pulmonary hypertension, tobacco abuse   1. Chronic Kidney Disease stage III - IV: with nephrotic range proteinuria and hematuria:  Chronic kidney disease secondary to diabetic nephropathy.  - Agree with IV fluids and steroids - Attempt to use as little dye as possible.  - Patient in his current condition is most likely not a candidate for dialysis.   2. Sepsis with endocarditis - daptomycin - Appreciate vascular and Infectious disease work up.   3. Hypertension with edema: blood pressure hypotensive. History of difficult to control. Currently on tamsulosin.    LOS: [facility] 1/30/20198:32 PM

## 2017-03-21 NOTE — Progress Notes (Signed)
[facility]  Daily Progress Note   Subjective  - * No surgery date entered *  Patient remains somewhat lethargic.  Awakens and answers questions intermittently.  Does not seem to recognize me even though I have seen him on numerous occasions  In the clinic.  He reports no current pain.  His left leg swelling is some better and the redness is down on the antibiotics.  Objective Vitals:   03/20/17 1517 03/20/17 1956 03/21/17 0306 03/21/17 1426  BP: 110/64 (!) 100/56 111/63 (!) 95/57  Pulse: 88 83 (!) 101 87  Resp: 20 16 13    Temp: 98.4 F (36.9 C) 97.6 F (36.4 C) 97.9 F (36.6 C) 98.4 F (36.9 C)  TempSrc: Oral Oral Oral Oral  SpO2: 99% 92% 98% 98%  Weight:   96.6 kg (212 lb 14.4 oz)   Height:        Intake/Output Summary (Last 24 hours) at 03/21/2017 1625 Last data filed at 03/21/2017 0900 Gross per 24 hour  Intake 907.42 ml  Output 950 ml  Net -42.58 ml    PULM  CTAB CV  tachycardic with murmur VASC  poorly palpable pedal pulses bilaterally.  2+ swelling in the left leg and 1+ swelling on the right leg.  Ulcers are currently dressed.  Laboratory CBC    Component Value Date/Time   WBC 11.0 (H) 03/21/2017 0705   HGB 14.3 03/21/2017 0705   HCT 43.6 03/21/2017 0705   PLT 181 03/21/2017 0705    BMET    Component Value Date/Time   NA 131 (L) 03/21/2017 0705   K 4.2 03/21/2017 0705   CL 99 (L) 03/21/2017 0705   CO2 18 (L) 03/21/2017 0705   GLUCOSE 262 (H) 03/21/2017 0705   BUN 70 (H) 03/21/2017 0705   CREATININE 1.93 (H) 03/21/2017 0705   CALCIUM 8.0 (L) 03/21/2017 0705   GFRNONAA 34 (L) 03/21/2017 0705   GFRAA 39 (L) 03/21/2017 0705    Assessment/Planning:    Left lower extremity ulceration and infection.  At this point, family is still pursuing aggressive care so an angiogram will be performed.  We will try to get this done tomorrow if the schedule allows.  Overall, the patient is a very poor prognosis with his ongoing  endocarditis.  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  This is a difficult and likely life-threatening situation.  Endocarditis getting treated with continuous antibiotics.    Festus BarrenJason Tali Coster  03/21/2017, 4:25 PM

## 2017-03-21 NOTE — Progress Notes (Signed)
[facility] [place] [facility]   PATIENT NAME: Jeremy Gutierrez    MR#:  [id number]  DATE OF BIRTH:  April 14, 1950  SUBJECTIVE:  CHIEF COMPLAINT: Pt  denies any complaints today.  No fever .    refusing CPAP during night and falling asleep during most of the day time  REVIEW OF SYSTEMS:   Review of Systems  Constitutional: Negative for chills and fever.  Eyes: Negative for blurred vision.  Respiratory:  denies any cough and shortness of breath.   Cardiovascular: Negative for chest pain.  Reports history of endocarditis Gastrointestinal: Denies any constipation or diarrhea.  Denies any abdominal pain nausea vomiting  Genitourinary: Negative for dysuria.  Musculoskeletal: Negative for joint pain.  Neurological: Negative for dizziness and headaches.  Skin: Lower leg wounds      DRUG ALLERGIES:   Allergies  Allergen Reactions  . Sulfa Antibiotics Hives    Patient states he is not allergic to this medication    VITALS:  Blood pressure (!) 95/57, pulse 87, temperature 98.4 F (36.9 C), temperature source Oral, resp. rate 13, height 5\' 11"  (1.803 m), weight 96.6 kg (212 lb 14.4 oz), SpO2 98 %.  PHYSICAL EXAMINATION:  GENERAL:  67 y.o.-year-old patient lying in the bed with no acute distress.  EYES: Pupils equal, round, reactive to light and accommodation. No scleral icterus. Extraocular muscles intact.  HEENT: Head atraumatic, normocephalic. Oropharynx and nasopharynx clear.  NECK:  Supple, no jugular venous distention. No thyroid enlargement, no tenderness.  LUNGS: Normal breath sounds bilaterally, no wheezing, rales,rhonchi or crepitation. No use of accessory muscles of respiration.  CARDIOVASCULAR: S1, S2 normal. No murmurs, rubs, or gallops.  ABDOMEN: Soft, nontender, nondistended. Bowel sounds present. No organomegaly or mass.  EXTREMITIES: .  Bilateral lower extremity chronic ulceration with erythema, open wounds Left lower extremity cold to touch  with a diminished pulses NEUROLOGIC: Arousable, follows verbal commands, motor and sensory are intact.  Oriented x2-3 PSYCHIATRIC: The patient is arousable  sKIN: No obvious rash   LABORATORY PANEL:   CBC Recent Labs  Lab 03/21/17 0705  WBC 11.0*  HGB 14.3  HCT 43.6  PLT 181   ------------------------------------------------------------------------------------------------------------------  Chemistries  Recent Labs  Lab 03/15/17 0928  03/21/17 0705  NA 130*   < > 131*  K 5.2*   < > 4.2  CL 95*   < > 99*  CO2 22   < > 18*  GLUCOSE 258*   < > 262*  BUN 99*   < > 70*  CREATININE 1.95*   < > 1.93*  CALCIUM 8.6*   < > 8.0*  AST 64*  --   --   ALT 61  --   --   ALKPHOS 148*  --   --   BILITOT 3.5*  --   --    < > = values in this interval not displayed.   ------------------------------------------------------------------------------------------------------------------  Cardiac Enzymes Recent Labs  Lab 03/15/17 0928  TROPONINI 0.05*   ------------------------------------------------------------------------------------------------------------------  RADIOLOGY:  No results found.  EKG:   Orders placed or performed during the hospital encounter of 03/15/17  . ED EKG 12-Lead  . ED EKG 12-Lead  . EKG 12-Lead  . EKG 12-Lead    ASSESSMENT AND PLAN:    67 year old male with recent diagnosis of endocarditis due to chronic lower extremity wounds currently on IV vancomycin followed by infectious diseasewho [facility] from [facility] with fevers and lethargy.  1. Sepsis: Patient presents with fever, tachypnea,  tachycardia and  with hypotension at the time of admission Sepsis is likely due to right lower extremity cellulitis and chronic wounds He also has PICC line which is removed January 27 and the tip was sent for culture , results are pending follow up with Dr. Sampson GoonFitzgerald  Continue antibiotics daptomycin and D/C  Zosyn.  Patient is started on  levofloxacin and Flagyl   MRSA PCR is negative.  March 15, 2017 blood cultures has revealed Klebsiella oxytoca Urine culture with Klebsiella pneumonia  wound culture with KLEBSIELLA OXYTOCA and STENOTROPHOMONAS MALTOPHILIA .  Vascular surgery is recommending an angiogram scheduled for tomorrow, n.p.o. after midnight ,discussed with Dr. Wyn Quakerew today  MRI  Neg for  osteomyelitis    2 recent diagnosis of.  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 antibiotics Currently being followed by Dr. Sampson GoonFitzgerald  Patient will need cardiothoracic evaluation once the infection clears   3. Acute metabolic encephalopathy due to sepsis and underlying dementia  4. Diabetes: sliding scale and ADA diet  low-dose Lantus for now, sliding scale insulin Diabetes nurse consultation requested  5. Chronic diastolic heart failure with preserved ejection fraction: Patient also has history of severe mitral stenosis with mitral regurgitation with lower extremity edema  hold  torsemide in view of renal insufficiency Follow-up with [facility], recommending TEE, await clinical improvement at this point  6. Acute on Chronic kidney disease stage III: with h/o nephrotic syndrome   Creatinine increased from baseline from sepsis with ATN.   Baseline creatinine 1.21; creatinine 2.0-1.72 -1.83--1.77-2.08-1.93 Consult nephrology   7BPH: Continue Flomax  8.  Sacral pressure ulcer: Continue wound care and reposition patient.  Wound care is following  osa-cpap  Poor prognosis palliative care is following.  Scheduled family meeting with all 3 children on Thursday     All the records are reviewed and case discussed with Care Management/Social Workerr. Management plans discussed with the patient, family and they are in agreement.  CODE STATUS:  fc  TOTAL TIME TAKING CARE OF THIS PATIENT: 32   minutes.   POSSIBLE D/C IN 2  DAYS, DEPENDING ON CLINICAL CONDITION.  Note: This dictation was prepared with Dragon dictation along with smaller phrase technology. Any transcriptional errors that result from this process are unintentional.   Ramonita LabAruna Kendria Halberg M.D on 03/21/2017 at 4:23 PM  Between 7am to 6pm - Pager - [phone] After 6pm go to [email or web address] - password [email or web address] [facility]  [facility]  Office  [phone]  CC: Primary care physician; Derwood KaplanEason, Ernest B, MD

## 2017-03-21 NOTE — Progress Notes (Signed)
[facility] CLINIC INFECTIOUS DISEASE PROGRESS NOTE Date of Admission:  03/15/2017     ID: Jeremy Gutierrez is a 67 y.o. male with sepsis   Active Problems:   Sepsis (HCC)   Cellulitis   Acute endocarditis   Acute on chronic diastolic congestive heart failure [facility])   Renal insufficiency   Palliative care by specialist   Goals of care, counseling/discussion   Subjective: A little more alert today. No fevers.  Legs still seeping a large amt of fluid   ROS  Unable to obtain  Medications:  Antibiotics Given (last 72 hours)    Date/Time Action Medication Dose Rate   03/18/17 1417 New Bag/Given   metroNIDAZOLE (FLAGYL) IVPB 500 mg 500 mg 100 mL/hr   03/18/17 1522 New Bag/Given   levofloxacin (LEVAQUIN) IVPB 750 mg 750 mg 100 mL/hr   03/18/17 1655 New Bag/Given   DAPTOmycin (CUBICIN) 685.5 mg in sodium chloride 0.9 % IVPB 685.5 mg 227.4 mL/hr   03/18/17 2209 New Bag/Given   metroNIDAZOLE (FLAGYL) IVPB 500 mg 500 mg 100 mL/hr   03/19/17 1028 New Bag/Given  [Patient needed IV]   metroNIDAZOLE (FLAGYL) IVPB 500 mg 500 mg 100 mL/hr   03/19/17 1607 New Bag/Given   DAPTOmycin (CUBICIN) 685.5 mg in sodium chloride 0.9 % IVPB 685.5 mg 227.4 mL/hr   03/19/17 1810 New Bag/Given   metroNIDAZOLE (FLAGYL) IVPB 500 mg 500 mg 100 mL/hr   03/20/17 0229 New Bag/Given   metroNIDAZOLE (FLAGYL) IVPB 500 mg 500 mg 100 mL/hr   03/20/17 1001 New Bag/Given   levofloxacin (LEVAQUIN) IVPB 750 mg 750 mg 100 mL/hr   03/20/17 1128 New Bag/Given   metroNIDAZOLE (FLAGYL) IVPB 500 mg 500 mg 100 mL/hr   03/20/17 1634 New Bag/Given   DAPTOmycin (CUBICIN) 685.5 mg in sodium chloride 0.9 % IVPB 685.5 mg 227.4 mL/hr   03/20/17 1710 New Bag/Given   metroNIDAZOLE (FLAGYL) IVPB 500 mg 500 mg 100 mL/hr   03/21/17 0321 New Bag/Given   metroNIDAZOLE (FLAGYL) IVPB 500 mg 500 mg 100 mL/hr   03/21/17 1000 New Bag/Given   metroNIDAZOLE (FLAGYL) IVPB 500 mg 500 mg 100 mL/hr     . aspirin EC  81 mg Oral Daily  .  bacitracin   Topical BID  . bacitracin   Topical Daily  . docusate sodium  100 mg Oral BID  . enoxaparin (LOVENOX) injection  40 mg Subcutaneous Q24H  . Gerhardt's butt cream   Topical BID  . insulin aspart  0-5 Units Subcutaneous QHS  . insulin aspart  0-9 Units Subcutaneous TID WC  . insulin aspart  4 Units Subcutaneous TID WC  . insulin glargine  5 Units Subcutaneous Daily  . montelukast  10 mg Oral Daily  . tamsulosin  0.4 mg Oral Daily  . [START ON 04/14/2017] Vitamin D (Ergocalciferol)  50,000 Units Oral Q30 days    Objective: Vital signs in last 24 hours: Temp:  [97.6 F (36.4 C)-98.4 F (36.9 C)] 97.9 F (36.6 C) (01/30 0306) Pulse Rate:  [83-101] 101 (01/30 0306) Resp:  [13-20] 13 (01/30 0306) BP: (100-111)/(56-64) 111/63 (01/30 0306) SpO2:  [92 %-99 %] 98 % (01/30 0306) Weight:  [96.6 kg (212 lb 14.4 oz)] 96.6 kg (212 lb 14.4 oz) (01/30 0306) Constitutional:disheveled, alert HENT: anicteric Mouth/Throat: Oropharynx is clear and moist. No oropharyngeal exudate.  Cardiovascular: Normal rate, regular rhythm and normal heart sounds. 2/6 sm  Pulmonary/Chest: poor air movement Abdominal: Soft. Bowel sounds are normal. He exhibits no distension. There is no  tenderness.  Lymphadenopathy: He has no cervical adenopathy.  Ext 1+ edema bil LE Neurological: confused Vascular - L foot is now warmer and less cyanotic appearing then on admission R foot 1+ DP pulse, foot distally is warm Skin: legs unwrapped and examined. Wraps were soaked with foul smelling drainage. There are extensive wounds ant shins R>L.  Black thin eschar with obvious purulence underneath.  Psychiatric: flat affect   Lab Results Recent Labs    03/20/17 1013 03/21/17 0705  WBC 12.6* 11.0*  HGB 14.5 14.3  HCT 43.5 43.6  NA 131* 131*  K 3.8 4.2  CL 99* 99*  CO2 22 18*  BUN 70* 70*  CREATININE 2.08* 1.93*    Microbiology: Results for orders placed or performed during the hospital encounter of  03/15/17  Culture, blood (Routine x 2)     Status: Abnormal   Collection Time: 03/15/17  9:29 AM  Result Value Ref Range Status   Specimen Description   Final    BLOOD LEFT FA Performed at [facility], [address], [place], [place] [address]    Special Requests   Final    BOTTLES DRAWN AEROBIC AND ANAEROBIC Blood Culture adequate volume Performed at [facility], [address] [place], [place] [address]    Culture  Setup Time   Final    GRAM NEGATIVE RODS IN BOTH AEROBIC AND ANAEROBIC BOTTLES CRITICAL RESULT CALLED TO, READ BACK BY AND VERIFIED WITH: MATT MCBANE AT 0307 ON 03/16/17 [facility].    Culture KLEBSIELLA OXYTOCA (A)  Final   Report Status 03/18/2017 FINAL  Final   Organism ID, Bacteria KLEBSIELLA OXYTOCA  Final      Susceptibility   Klebsiella oxytoca - MIC*    AMPICILLIN >=32 RESISTANT Resistant     CEFAZOLIN <=4 SENSITIVE Sensitive     CEFEPIME <=1 SENSITIVE Sensitive     CEFTAZIDIME <=1 SENSITIVE Sensitive     CEFTRIAXONE <=1 SENSITIVE Sensitive     CIPROFLOXACIN <=0.25 SENSITIVE Sensitive     GENTAMICIN <=1 SENSITIVE Sensitive     IMIPENEM <=0.25 SENSITIVE Sensitive     TRIMETH/SULFA <=20 SENSITIVE Sensitive     AMPICILLIN/SULBACTAM 16 INTERMEDIATE Intermediate     PIP/TAZO <=4 SENSITIVE Sensitive     Extended ESBL NEGATIVE Sensitive     * KLEBSIELLA OXYTOCA  Culture, blood (Routine x 2)     Status: Abnormal   Collection Time: 03/15/17  9:29 AM  Result Value Ref Range Status   Specimen Description   Final    BLOOD RIGHT ANTECUBITAL Performed at [facility], [address] [place], [place] [address]    Special Requests   Final    BOTTLES DRAWN AEROBIC AND ANAEROBIC Blood Culture results may not be optimal due to an excessive volume of blood received in culture bottles Performed at [facility], [address] [place], [place] [address]    Culture  Setup Time   Final    GRAM NEGATIVE RODS IN BOTH AEROBIC AND ANAEROBIC  BOTTLES CRITICAL VALUE NOTED.  VALUE IS CONSISTENT WITH PREVIOUSLY REPORTED AND CALLED VALUE. Performed at [facility], [address] [place], [place] [address]    Culture (A)  Final    KLEBSIELLA OXYTOCA SUSCEPTIBILITIES PERFORMED ON PREVIOUS CULTURE WITHIN THE LAST 5 DAYS. Performed at [facility], [address], [place], [place] [address]    Report Status 03/19/2017 FINAL  Final  Urine culture     Status: Abnormal   Collection  Time: 03/15/17  9:29 AM  Result Value Ref Range Status   Specimen Description   Final    URINE, CATHETERIZED Performed at [facility], [address] [place], [place] [address]    Special Requests   Final    NONE Performed at [facility], [address] [place], [place] [address]    Culture 70,000 COLONIES/mL KLEBSIELLA PNEUMONIAE (A)  Final   Report Status 03/18/2017 FINAL  Final   Organism ID, Bacteria KLEBSIELLA PNEUMONIAE (A)  Final      Susceptibility   Klebsiella pneumoniae - MIC*    AMPICILLIN >=32 RESISTANT Resistant     CEFAZOLIN <=4 SENSITIVE Sensitive     CEFTRIAXONE <=1 SENSITIVE Sensitive     CIPROFLOXACIN <=0.25 SENSITIVE Sensitive     GENTAMICIN <=1 SENSITIVE Sensitive     IMIPENEM <=0.25 SENSITIVE Sensitive     NITROFURANTOIN 64 INTERMEDIATE Intermediate     TRIMETH/SULFA <=20 SENSITIVE Sensitive     AMPICILLIN/SULBACTAM 4 SENSITIVE Sensitive     PIP/TAZO <=4 SENSITIVE Sensitive     Extended ESBL NEGATIVE Sensitive     * 70,000 COLONIES/mL KLEBSIELLA PNEUMONIAE  Blood Culture ID Panel (Reflexed)     Status: Abnormal   Collection Time: 03/15/17  9:29 AM  Result Value Ref Range Status   Enterococcus species NOT DETECTED NOT DETECTED Final   Vancomycin resistance NOT DETECTED NOT DETECTED Final   Listeria monocytogenes NOT DETECTED NOT DETECTED Final   Staphylococcus species NOT DETECTED NOT DETECTED Final   Staphylococcus aureus NOT DETECTED NOT DETECTED Final   Methicillin resistance  NOT DETECTED NOT DETECTED Final   Streptococcus species NOT DETECTED NOT DETECTED Final   Streptococcus agalactiae NOT DETECTED NOT DETECTED Final   Streptococcus pneumoniae NOT DETECTED NOT DETECTED Final   Streptococcus pyogenes NOT DETECTED NOT DETECTED Final   Acinetobacter baumannii NOT DETECTED NOT DETECTED Final   Enterobacteriaceae species DETECTED (A) NOT DETECTED Final    Comment: CRITICAL RESULT CALLED TO, READ BACK BY AND VERIFIED WITH: MATT MCBANE AT 0307 ON 03/16/17 [facility]. Enterobacteriaceae represent a large family of gram-negative bacteria, not a single organism.    Enterobacter cloacae complex NOT DETECTED NOT DETECTED Final   Escherichia coli NOT DETECTED NOT DETECTED Final   Klebsiella oxytoca DETECTED (A) NOT DETECTED Final    Comment: CRITICAL RESULT CALLED TO, READ BACK BY AND VERIFIED WITH: MATT MCBANE AT 0307 ON 03/16/17 [facility].    Klebsiella pneumoniae NOT DETECTED NOT DETECTED Final   Proteus species NOT DETECTED NOT DETECTED Final   Serratia marcescens NOT DETECTED NOT DETECTED Final   Carbapenem resistance NOT DETECTED NOT DETECTED Final   Haemophilus influenzae NOT DETECTED NOT DETECTED Final   Neisseria meningitidis NOT DETECTED NOT DETECTED Final   Pseudomonas aeruginosa NOT DETECTED NOT DETECTED Final   Candida albicans NOT DETECTED NOT DETECTED Final   Candida glabrata NOT DETECTED NOT DETECTED Final   Candida krusei NOT DETECTED NOT DETECTED Final   Candida parapsilosis NOT DETECTED NOT DETECTED Final   Candida tropicalis NOT DETECTED NOT DETECTED Final    Comment: Performed at [facility], [address] [place], [place] [address]  Aerobic/Anaerobic Culture (surgical/deep wound)     Status: None   Collection Time: 03/15/17 11:15 AM  Result Value Ref Range Status   Specimen Description   Final    LEG Performed at [facility], [address] [place], [place] [address]    Special Requests   Final    NONE Performed at  [facility], [address] [place], [place] [address]    Gram Stain   Final    RARE WBC PRESENT, PREDOMINANTLY PMN ABUNDANT GRAM NEGATIVE RODS RARE GRAM POSITIVE COCCI    Culture   Final    ABUNDANT KLEBSIELLA OXYTOCA MODERATE STENOTROPHOMONAS MALTOPHILIA NO ANAEROBES ISOLATED Performed at [facility], [address], [place], [place] [address]    Report Status 03/19/2017 FINAL  Final   Organism ID, Bacteria STENOTROPHOMONAS MALTOPHILIA  Final      Susceptibility   Stenotrophomonas maltophilia - MIC*    LEVOFLOXACIN 0.25 SENSITIVE Sensitive     TRIMETH/SULFA <=20 SENSITIVE Sensitive     * MODERATE STENOTROPHOMONAS MALTOPHILIA  MRSA PCR Screening     Status: None   Collection Time: 03/15/17  6:30 PM  Result Value Ref Range Status   MRSA by PCR NEGATIVE NEGATIVE Final    Comment:        The GeneXpert MRSA Assay (FDA approved for NASAL specimens only), is one component of a comprehensive MRSA colonization surveillance program. It is not intended to diagnose MRSA infection nor to guide or monitor treatment for MRSA infections. Performed at [facility], [address], [place], [place] [address]   Cath Tip Culture     Status: None (Preliminary result)   Collection Time: 03/18/17  2:15 PM  Result Value Ref Range Status   Specimen Description   Final    CATH TIP Performed at [facility], [address], [place], [place] [address]    Special Requests   Final    NONE Performed at [facility], [address] [place], [place] [address]    Culture   Final    NO GROWTH 2 DAYS Performed at [facility], [address], [place], [place] [address]    Report Status PENDING  Incomplete    Studies/Results: No results found.  Assessment/Plan: 12.26  TEE  - Aortic valve: There was trivial regurgitation. - Mitral valve: There was a vegetation. The findings are consistent with moderate to severe stenosis. There was moderate  to severe regurgitation. - Left atrium: The atrium was dilated. No evidence of thrombus in the appendage. - Right atrium: The atrium was dilated. - Atrial septum: Echo contrast study showed no right-to-left atrial level shunt, at baseline or with provocation. Echo contrast study showed no right-to-left atrial level shunt, at baseline or with provocation.  Impressions:  - Mitral regurgitation, consistent with endocarditis. There was a vegetation, consistent with endocarditis.  Assessment:   Jeremy Gutierrez is a 67 y.o. male with recent admission when he was found to have MSSA and CNS bacteremia with TEE showing MV veg, mod to severe MS and mod to severe MR.   Now with recurrent fevers and sepsis despite being on IV vancomycin as well as worsening of LE wounds. I suspect his wounds are source of infection and will need further evaluation for debridement and also for possible vascular intervention. His L foot is very cool and cyanotic but has a DP pulse by doppler. He also has ARF with BUN 99 and cr 1.95. On otpt labs from 1/8 his Bun was 40 and cr 1.21. He has severe ulceration of R leg and mod of L leg as well as a sacral wound.  Xray does not show any bony involvement nor soft tissue air Jan 25 - BCX Klebsiella as well as wound cx. Fever down but wbc increased some.  Seen by vascular  and not able to tolerate  angiography.  Jan 28 - fevers improved, more alert. PICC removed Jan 29 - no fever, wbc down to 12. Cath tip cx ngtd.   Jan 30 - wounds still look quite bad. 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  At that time can stop the IV dapto and change to oral abx.  Cont  levo and flagyl to cover Klebsiella, Stentotrophomonas and anaerobes.  Continue control of LE edema Cardiology, vascular and palliative notes  reviewed and help appreciated.  Would hold on TEE at this point since being treated for Endocarditis already. Poor prognosis with endocarditis and now Gram negative sepsis.  Thank you very much for the consult. Will follow with you.  Mick Sell   03/21/2017, 2:10 PM

## 2017-03-21 NOTE — Progress Notes (Addendum)
Inpatient Diabetes Program Recommendations  AACE/ADA: New Consensus Statement on Inpatient Glycemic Control (2015)  Target Ranges:  Prepandial:   less than 140 mg/dL      Peak postprandial:   less than 180 mg/dL (1-2 hours)      Critically ill patients:  140 - 180 mg/dL   Results for Jeremy Gutierrez, Jeremy Gutierrez (MRN [id number]) as of 03/21/2017 09:32  Ref. Range 03/20/2017 07:47 03/20/2017 11:48 03/20/2017 16:48 03/20/2017 21:19  Glucose-Capillary Latest Ref Range: 65 - 99 mg/dL 409123 (H)  1 unit Novolog 133 (H)  1 unit Novolog 109 (H)  0 units Novolog 148 (H)   Results for Jeremy Gutierrez, Jeremy Gutierrez (MRN [id number]) as of 03/21/2017 09:32  Ref. Range 03/21/2017 07:48  Glucose-Capillary Latest Ref Range: 65 - 99 mg/dL 956245 (H)  7 units Novolog    Home DM Meds: Humalog 75/25 Insulin- 15 units BID  Current Insulin Orders: Novolog Sensitive Correction Scale/ SSI (0-9 units) TID AC + HS                                       Novolog 4 units TID      Note patient with Hypoglycemia on the AM of 01/28 after receiving 8 units Lantus the day prior to the Hypoglycemic event .  Lantus was discontinued.  Did not eat well yesterday and only received a total of 2 units Novolog all day yesterday.    AM CBG elevated today: 245 mg/dl.    MD- Please consider the following in-hospital insulin adjustments:  1. Restart Lantus at lower dose- Lantus 5 units daily  2. Reduce Novolog Meal Coverage to: Novolog 2 units TID with meals (hold if pt eats <50% of meal)     --Will follow patient during hospitalization--  Ambrose FinlandJeannine Johnston Altonio Schwertner RN, MSN, CDE Diabetes Coordinator Inpatient Glycemic Control Team Team Pager: [phone] (8a-5p)

## 2017-03-21 NOTE — Progress Notes (Signed)
Nutrition Follow-up  DOCUMENTATION CODES:   Obesity unspecified  INTERVENTION:  Daughter reports patient no longer needs Glucerna.  Encouraged adequate intake of calories and protein from meals.  Provide daily MVI.  NUTRITION DIAGNOSIS:   Inadequate oral intake related to lethargy/confusion as evidenced by per patient/family report.  Improving per daughter's report.  GOAL:   Patient will meet greater than or equal to 90% of their needs  Progressing.  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  Met with patient and his daughter at bedside. Patient was awake eating his lunch. Daughter reports he had a decreased appetite and intake PTA but it has now returned to normal. She reports he finished 100% of his breakfast meal this morning and did well on his dinner last night, too. She reports he can finish about 50% of the entree, and then also eats his vegetables and fruit/dessert. She reports he was never ordered for Glucerna. This RD could not find that an order was ever placed either. Daughter reports since he is eating back to baseline now she does not want it ordered for him now.  Medications reviewed and include: Colace, Novolog 0-9 units TID, Novolog 0-5 units QHS, Novolog 4 units TID, Novolog 5 units daily, vitamin D 50000 units every 30 days, Levaquin, Flagyl.  Labs reviewed: CBG 109-279, Sodium 131, Chloride 99, CO2 18, BUN 70, Creatinine 1.93.  Weight trend: 96.6 kg on 1/30; -4.8 kg during admission from diuresis  Diet Order:  Diet heart healthy/carb modified Room service appropriate? Yes; Fluid consistency: Thin  EDUCATION NEEDS:   Not  appropriate for education at this time  Skin:  Skin Assessment: Skin Integrity Issues: Skin Integrity Issues:: Other (Comment) Other: Venous stasis ulcers  Last BM:  PTA  Height:   Ht Readings from Last 1 Encounters:  03/15/17 5' 11"  (1.803 m)    Weight:   Wt Readings from Last 1 Encounters:  03/21/17 212 lb 14.4 oz (96.6 kg)    Ideal Body Weight:  78.18 kg  BMI:  Body mass index is 29.69 kg/m.  Estimated Nutritional Needs:   Kcal:  2115-2290 (MSJ x 1.2-1.3)  Protein:  95-110 grams (1.2-1.4 grams/kg IBW)  Fluid:  2 L/day (25 mL/kg IBW)  Willey Blade, MS, RD, LDN Office: [phone] Pager: [phone] After Hours/Weekend Pager: [phone]

## 2017-03-22 ENCOUNTER — Encounter
Admission: EM | Disposition: A | Discharge status: Hospice | Payer: Self-pay | Source: Home / Self Care | Attending: Internal Medicine

## 2017-03-22 ENCOUNTER — Encounter: Payer: Self-pay | Admitting: Vascular Surgery

## 2017-03-22 DIAGNOSIS — Z66 Do not resuscitate: Secondary | ICD-10-CM

## 2017-03-22 DIAGNOSIS — I70248 Atherosclerosis of native arteries of left leg with ulceration of other part of lower left leg: Secondary | ICD-10-CM

## 2017-03-22 HISTORY — PX: LOWER EXTREMITY ANGIOGRAPHY: CATH118251

## 2017-03-22 LAB — CBC WITH DIFFERENTIAL/PLATELET
Basophils Absolute: 0 10*3/uL (ref 0–0.1)
Basophils Relative: 0 %
EOS PCT: 0 %
Eosinophils Absolute: 0 10*3/uL (ref 0–0.7)
HEMATOCRIT: 45.5 % (ref 40.0–52.0)
Hemoglobin: 14.9 g/dL (ref 13.0–18.0)
LYMPHS ABS: 0.7 10*3/uL — AB (ref 1.0–3.6)
LYMPHS PCT: 7 %
MCH: 32.8 pg (ref 26.0–34.0)
MCHC: 32.7 g/dL (ref 32.0–36.0)
MCV: 100.3 fL — AB (ref 80.0–100.0)
MONO ABS: 0.3 10*3/uL (ref 0.2–1.0)
Monocytes Relative: 3 %
NEUTROS ABS: 9.3 10*3/uL — AB (ref 1.4–6.5)
Neutrophils Relative %: 90 %
PLATELETS: 232 10*3/uL (ref 150–440)
RBC: 4.53 MIL/uL (ref 4.40–5.90)
RDW: 18.5 % — AB (ref 11.5–14.5)
WBC: 10.3 10*3/uL (ref 3.8–10.6)

## 2017-03-22 LAB — CATH TIP CULTURE: CULTURE: NO GROWTH

## 2017-03-22 LAB — BASIC METABOLIC PANEL
Anion gap: 13 (ref 5–15)
BUN: 71 mg/dL — AB (ref 6–20)
CO2: 21 mmol/L — AB (ref 22–32)
Calcium: 8.3 mg/dL — ABNORMAL LOW (ref 8.9–10.3)
Chloride: 99 mmol/L — ABNORMAL LOW (ref 101–111)
Creatinine, Ser: 1.87 mg/dL — ABNORMAL HIGH (ref 0.61–1.24)
GFR calc Af Amer: 41 mL/min — ABNORMAL LOW (ref >60)
GFR calc non Af Amer: 35 mL/min — ABNORMAL LOW (ref >60)
GLUCOSE: 274 mg/dL — AB (ref 65–99)
POTASSIUM: 4.2 mmol/L (ref 3.5–5.1)
Sodium: 133 mmol/L — ABNORMAL LOW (ref 135–145)

## 2017-03-22 LAB — GLUCOSE, CAPILLARY
GLUCOSE-CAPILLARY: 219 mg/dL — AB (ref 65–99)
Glucose-Capillary: 256 mg/dL — ABNORMAL HIGH (ref 65–99)
Glucose-Capillary: 226 mg/dL — ABNORMAL HIGH (ref 65–99)

## 2017-03-22 SURGERY — LOWER EXTREMITY ANGIOGRAPHY
Anesthesia: Moderate Sedation | Laterality: Left

## 2017-03-22 MED ORDER — HEPARIN SODIUM (PORCINE) 1000 UNIT/ML IJ SOLN
INTRAMUSCULAR | Status: DC | PRN
  Administered 2017-03-22: 4000 [IU] via INTRAVENOUS

## 2017-03-22 MED ORDER — MORPHINE SULFATE (CONCENTRATE) 10 MG/0.5ML PO SOLN
10.0000 mg | ORAL | Status: DC | PRN
  Administered 2017-03-22: 10 mg via ORAL
  Filled 2017-03-22: qty 1

## 2017-03-22 MED ORDER — MIDAZOLAM HCL 2 MG/2ML IJ SOLN
INTRAMUSCULAR | Status: DC | PRN
  Administered 2017-03-22 (×2): 1 mg via INTRAVENOUS
  Administered 2017-03-22: 2 mg via INTRAVENOUS
  Administered 2017-03-22: 1 mg via INTRAVENOUS

## 2017-03-22 MED ORDER — SODIUM CHLORIDE 0.9 % IV SOLN
INTRAVENOUS | Status: DC
  Administered 2017-03-22: 10:00:00 via INTRAVENOUS

## 2017-03-22 MED ORDER — MIDAZOLAM HCL 5 MG/5ML IJ SOLN
INTRAMUSCULAR | Status: AC
  Filled 2017-03-22: qty 5

## 2017-03-22 MED ORDER — FENTANYL CITRATE (PF) 100 MCG/2ML IJ SOLN
INTRAMUSCULAR | Status: AC
  Filled 2017-03-22: qty 2

## 2017-03-22 MED ORDER — HEPARIN SODIUM (PORCINE) 1000 UNIT/ML IJ SOLN
INTRAMUSCULAR | Status: AC
  Filled 2017-03-22: qty 1

## 2017-03-22 MED ORDER — MORPHINE SULFATE (PF) 2 MG/ML IV SOLN: 2.0000 mg | INTRAVENOUS | Status: DC | PRN

## 2017-03-22 MED ORDER — DEXTROSE 5 % IV SOLN
1.5000 g | INTRAVENOUS | Status: AC
  Administered 2017-03-22: 1.5 g via INTRAVENOUS
  Filled 2017-03-22: qty 1.5

## 2017-03-22 MED ORDER — GLYCOPYRROLATE 0.2 MG/ML IJ SOLN
0.2000 mg | INTRAMUSCULAR | Status: DC | PRN
  Filled 2017-03-22: qty 1

## 2017-03-22 MED ORDER — FENTANYL CITRATE (PF) 100 MCG/2ML IJ SOLN
INTRAMUSCULAR | Status: DC | PRN
  Administered 2017-03-22 (×4): 50 ug via INTRAVENOUS

## 2017-03-22 MED ORDER — LORAZEPAM 2 MG/ML IJ SOLN: 1.0000 mg | INTRAMUSCULAR | Status: DC | PRN

## 2017-03-22 MED ORDER — LIDOCAINE-EPINEPHRINE (PF) 1 %-1:200000 IJ SOLN
INTRAMUSCULAR | Status: AC
  Filled 2017-03-22: qty 30

## 2017-03-22 SURGICAL SUPPLY — 23 items
BALLN LUTONIX 5X220X130 (BALLOONS) ×3
BALLN LUTONIX 6X150X130 (BALLOONS) ×3
BALLN MUSTANG 5X200X135 (BALLOONS) ×3
BALLN ULTRASCORE 5X100X130 (BALLOONS) ×3
BALLN ULTRVRSE 4X80X150 (BALLOONS) ×2
BALLN ULTRVRSE 4X80X150 OTW (BALLOONS) ×1
BALLOON LUTONIX 5X220X130 (BALLOONS) ×1 IMPLANT
BALLOON LUTONIX 6X150X130 (BALLOONS) ×1 IMPLANT
BALLOON MUSTANG 5X200X135 (BALLOONS) ×1 IMPLANT
BALLOON ULTRASCORE 5X100X130 (BALLOONS) ×1 IMPLANT
BALLOON ULTRVRSE 4X80X150 OTW (BALLOONS) ×1 IMPLANT
CATH BEACON 5 .038 100 VERT TP (CATHETERS) ×3 IMPLANT
CATH PIG 70CM (CATHETERS) ×3 IMPLANT
DEVICE PRESTO INFLATION (MISCELLANEOUS) ×3 IMPLANT
DEVICE STARCLOSE SE CLOSURE (Vascular Products) ×3 IMPLANT
GLIDEWIRE ADV .035X260CM (WIRE) ×3 IMPLANT
LIFESTENT SOLO 6X200X135 (Permanent Stent) ×3 IMPLANT
PACK ANGIOGRAPHY (CUSTOM PROCEDURE TRAY) ×3 IMPLANT
SHEATH ANL2 6FRX45 HC (SHEATH) ×3 IMPLANT
SHEATH BRITE TIP 5FRX11 (SHEATH) ×3 IMPLANT
TUBING CONTRAST HIGH PRESS 72 (TUBING) ×3 IMPLANT
WIRE G V18X300CM (WIRE) ×3 IMPLANT
WIRE J 3MM .035X145CM (WIRE) ×3 IMPLANT

## 2017-03-22 NOTE — Op Note (Signed)
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 Ultrasound guidance for vascular access right femoral artery 2. Catheter placement into left anterior tibial artery from right femoral approach 3. Aortogram and selective left lower extremity angiogram 4. Percutaneous transluminal angioplasty of left common femoral artery and proximal superficial femoral artery with 5 mm diameter scoring balloon and 6 mm diameter Lutonix drug-coated angioplasty balloon 5. Percutaneous transluminal angioplasty of the left distal SFA and above-knee popliteal artery with 5 mm diameter by 22 cm length Lutonix drug-coated angioplasty balloon  6.  Percutaneous transluminal angioplasty of the left proximal anterior tibial artery with 4 mm diameter by 10 cm length angioplasty balloon  7.  Self-expanding stent placement to the distal SFA and above-knee popliteal artery with 6 mm diameter by 20 cm length stent for greater than 50% residual stenosis after angioplasty 8. StarClose closure device right femoral artery  EBL: 10 cc  Contrast: 80 cc  Fluoro Time: 9.3 minutes  Moderate Conscious Sedation Time: approximately 50 minutes using 5 mg of Versed and 200 Mcg of Fentanyl  Indications: Patient is a 67 y.o.male with infection and ulceration of both lower extremities with poorly palpable pedal pulses. The patient is brought in for angiography for further evaluation and potential treatment.  Due to the limb threatening nature of the situation, angiogram was performed for attempted limb salvage. Risks and benefits are discussed and informed consent is obtained  Procedure:  The patient was identified and appropriate procedural time out was performed. The patient was then placed supine on the table and prepped and draped in the usual sterile fashion.Moderate conscious sedation was administered during a face to face encounter with the patient throughout the procedure with my supervision of the RN administering medicines and monitoring the patient's vital signs, pulse oximetry, telemetry and mental status throughout from the start of the procedure until the patient was taken to the recovery room. Ultrasound was used to evaluate the right common femoral artery. It was patent although fairly heavily diseased calcific plaque on ultrasound. A digital ultrasound image was acquired. A Seldinger needle was used to access the right common femoral artery under direct ultrasound guidance and a permanent image was performed. A 0.035 J wire was advanced without resistance and a 5Fr sheath was placed. Pigtail catheter was placed into the aorta and an AP aortogram was performed. The renal arteries appeared normal.  There is a small abdominal aortic aneurysm.  The aorta and iliac arteries had no significant stenosis. I then crossed the aortic bifurcation and advanced to the left femoral head. Selective left lower extremity angiogram was then performed. This demonstrated about a 75% stenosis in the left common femoral artery above the bifurcation.  There was then a 80-90% stenosis in the proximal superficial femoral artery at its origin.  The superficial femoral artery then normalized until the mid to distal segment where there was highly calcific disease which was significantly stenotic in several areas and distal SFA and above-knee popliteal artery.  The stenoses were nearly occlusive at some levels.  The below-knee popliteal artery normalized, but there was a chunk of calcific plaque at what was essentially a tibial trifurcation with a very short tibioperoneal trunk into the peroneal and  posterior tibial arteries.  The anterior tibial artery was actually the best runoff into the foot.  This junk  of plaque at this trifurcation appeared to inhibit flow to all 3 tibial vessels somewhat. The patient was systemically heparinized and a 6 [place] Ansell sheath was then placed over the [employer] wire. I then used a Kumpe catheter and the advantage wire to navigate through the common femoral lesion, proximal SFA lesion, distal SFA and popliteal lesions and confirm intraluminal flow in the below-knee popliteal artery.  I then cross the plaque at the tibial trifurcation and exchanged for a 0.018 wire and parked this in the anterior tibial artery.  I then proceeded with treatment.  I initially treated the common femoral artery and proximal SFA with a 5 mm diameter by 10 cm length scoring balloon inflated to 12 atm for 1 minute.  Following this, there remained greater than 50% stenosis of both locations.  I upsized to a 6 mm diameter by 15 cm length Lutonix drug-coated angioplasty balloon in the common femoral artery and proximal SFA.  This was inflated to 8 atm for 1 minute.  For the distal SFA and above-knee popliteal artery a 5 mm diameter by 22 cm length Lutonix drug-coated angioplasty balloon was inflated to 12 atm for 1 minute.  I used a 4 mm diameter by 10 cm length angioplasty balloon for the proximal anterior tibial artery back to the distal popliteal artery to address the trunk of plaque at the tibial trifurcation.  This was inflated to 10 atm for 1 minute.  Completion angiogram showed the anterior tibial artery and plaque at the tibial trifurcation to now have a less than 30% residual stenosis.  The distal SFA and above-knee popliteal artery had greater than 50% residual stenosis, and I elected to treat this area with a self-expanding stent.  A 6 mm diameter by 20 cm length self-expanding stent was deployed in the distal SFA and above-knee popliteal artery.  This was postdilated with a 5 mm  balloon with about 15-20% residual stenosis.  There was still about a 50-60% residual stenosis in the common femoral artery but only about a 20-30% residual stenosis at the SFA origin.  This was an area that stent placement would be contraindicated, and so I elected to leave this residual lesion.  Although the patient is a poor surgical candidate, we elected to perform endovascular therapy in hopes of improving his perfusion at all locations today.  With the treatment in the other areas and the improvement at the common femoral artery I felt we had to improve the situation, and we could consider more aggressive therapy at another date if his wounds do not heal. I elected to terminate the procedure. The sheath was removed and StarClose closure device was deployed in the right femoral artery with excellent hemostatic result. The patient was taken to the recovery room in stable condition having tolerated the procedure well.  Findings:  Aortogram:The renal arteries appeared normal.  There is a small abdominal aortic aneurysm.  The aorta and iliac arteries had no significant stenosis. Left lower Extremity: This demonstrated about a 75% stenosis in the left common femoral artery above the bifurcation.  There was then a 80-90% stenosis in the proximal superficial femoral artery at its origin.  The superficial femoral artery then normalized until the mid to distal segment where there was highly calcific disease which was significantly stenotic in several areas and distal SFA and above-knee popliteal artery.  The stenoses were nearly occlusive at some levels.  The below-knee popliteal artery normalized, but there was a chunk of calcific plaque at what  was essentially a tibial trifurcation with a very short tibioperoneal trunk into the peroneal and posterior tibial arteries.  The anterior tibial artery was actually the best runoff into the foot.  This junk of plaque at this trifurcation appeared  to inhibit flow to all 3 tibial vessels somewhat   Disposition: Patient was taken to the recovery room in stable condition having tolerated the procedure well.  Complications: None  Leotis Pain 03/22/2017 10:51 AM   This note was created with Dragon Medical transcription system. Any errors in dictation are purely unintentional.

## 2017-03-22 NOTE — Progress Notes (Signed)
[facility] - [place] at [facility]   PATIENT NAME: Jeremy Gutierrez    MR#:  [id number]  DATE OF BIRTH:  November 09, 1950  SUBJECTIVE:  CHIEF COMPLAINT: Pt had angioplasty today, subsequently family members had palliative care meeting and decided to go with hospice home  REVIEW OF SYSTEMS:   Review of Systems  Patient was lethargic from sedation, seen after angiogram     DRUG ALLERGIES:   Allergies  Allergen Reactions  . Sulfa Antibiotics Hives    Patient states he is not allergic to this medication    VITALS:  Blood pressure 110/77, pulse 90, temperature (!) 97.4 F (36.3 C), temperature source Oral, resp. rate (!) 24, height 5\' 11"  (1.803 m), weight 100.7 kg (222 lb), SpO2 100 %.  PHYSICAL EXAMINATION:  GENERAL:  67 y.o.-year-old patient lying in the bed with no acute distress.  EYES: Pupils equal, round, reactive to light and accommodation. No scleral icterus. Extraocular muscles intact.  HEENT: Head atraumatic, normocephalic. Oropharynx and nasopharynx clear.  NECK:  Supple, no jugular venous distention. No thyroid enlargement, no tenderness.  LUNGS: Normal breath sounds bilaterally, no wheezing, rales,rhonchi or crepitation. No use of accessory muscles of respiration.  CARDIOVASCULAR: S1, S2 normal. No murmurs, rubs, or gallops.  ABDOMEN: Soft, nontender, nondistended. Bowel sounds present.  EXTREMITIES: .  Bilateral lower extremity chronic ulceration with erythema, open wounds Left lower extremity -status post angioplasty today NEUROLOGIC: Arousable, motor and sensory are intact.   PSYCHIATRIC: The patient is arousable  sKIN: No obvious rash   LABORATORY PANEL:   CBC Recent Labs  Lab 03/22/17 1159  WBC 10.3  HGB 14.9  HCT 45.5  PLT 232   ------------------------------------------------------------------------------------------------------------------  Chemistries  Recent Labs  Lab 03/22/17 1159  NA 133*  K 4.2  CL 99*  CO2 21*   GLUCOSE 274*  BUN 71*  CREATININE 1.87*  CALCIUM 8.3*   ------------------------------------------------------------------------------------------------------------------  Cardiac Enzymes No results for input(s): TROPONINI in the last 168 hours. ------------------------------------------------------------------------------------------------------------------  RADIOLOGY:  No results found.  EKG:   Orders placed or performed during the hospital encounter of 03/15/17  . ED EKG 12-Lead  . ED EKG 12-Lead  . EKG 12-Lead  . EKG 12-Lead    ASSESSMENT AND PLAN:    67 year old male with recent diagnosis of endocarditis due to chronic lower extremity wounds currently on IV vancomycin followed by infectious diseasewho presents from [facility] with fevers and lethargy.  1. Sepsis: Patient presents with fever, tachypnea, tachycardia and  with hypotension at the time of admission Sepsis is likely due to right lower extremity cellulitis and chronic wounds Urine culture with Klebsiella pneumonia PICC line culture with no growth  wound culture with KLEBSIELLA OXYTOCA and STENOTROPHOMONAS MALTOPHILIA .  Patient had left lower extremity angiogram today by vascular surgery.  Stent placed  2 recent diagnosis of.  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 Acute metabolic encephalopathy due to sepsis and underlying dementia  4. Diabetes: 5. Chronic diastolic heart failure with preserved ejection fraction:  6. Acute on Chronic kidney disease stage III: with h/o nephrotic syndrome     7BPH: Continue Flomax  8.  Sacral pressure ulcer: Continue wound care and reposition patient.  Wound care is following  osa-cpap  Poor prognosis  palliative care is following.  Had a family meeting and family members anonymously decided to DNR/DNI with comfort care measures.  Antibiotics were discontinued Comfort care measures implemented Awaiting hospice home  placement  All the records are reviewed and case discussed with Care Management/Social Workerr. Management plans discussed with the patient, family and they are in agreement.  CODE STATUS:   DNR with comfort care TOTAL TIME TAKING CARE OF THIS PATIENT: 33  minutes.   POSSIBLE D/C IN 2  DAYS, DEPENDING ON CLINICAL CONDITION.  Note: This dictation was prepared with Dragon dictation along with smaller phrase technology. Any transcriptional errors that result from this process are unintentional.   Ramonita LabAruna Domanik [place] M.D on 03/22/2017 at 8:26 PM  Between 7am to 6pm - Pager - [phone] After 6pm go to [email or web address] - password [email or web address]  [facility]  Office  [phone]  CC: Primary care physician; Derwood KaplanEason, Ernest B, MD

## 2017-03-22 NOTE — Plan of Care (Signed)
VSS, free of falls during shift.  Denies pain.  Wound care/dsg change done @ 0600.  Pt removed PIV, new IV placed.  Transferred to low air loss mattress.  No other needs overnight.  Bed in low position, bed alarm on.  Call bell within reach, WCTM.

## 2017-03-22 NOTE — Progress Notes (Signed)
Inpatient Diabetes Program Recommendations  AACE/ADA: New Consensus Statement on Inpatient Glycemic Control (2015)  Target Ranges:  Prepandial:   less than 140 mg/dL      Peak postprandial:   less than 180 mg/dL (1-2 hours)      Critically ill patients:  140 - 180 mg/dL   Lab Results  Component Value Date   GLUCAP 256 (H) 03/22/2017   HGBA1C 9.8 (H) 02/09/2017    Review of Glycemic Control Results for Merideth AbbeyLSTON, Sven L (MRN [id number]) as of 03/22/2017 08:43  Ref. Range 03/21/2017 07:48 03/21/2017 11:28 03/21/2017 17:12 03/21/2017 21:53 03/22/2017 08:14  Glucose-Capillary Latest Ref Range: 65 - 99 mg/dL 213245 (H) 086279 (H) 578202 (H) 189 (H) 256 (H)    Home DM Meds:Humalog 75/25 Insulin- 15 units BID  Current Insulin Orders:Novolog Sensitive Correction Scale/ SSI (0-9 units) TID AC , Novolog 0-5 units qhs Novolog 4 units TID, Lantus 5 units qday   *solumedrol 80mg  q8h   Please consider increasing Lantus to 6 units qday and decrease Novolog to 3 units tid with meals  Susette RacerJulie Ahyan Kreeger, RN, [place], [place]HA, CDE Diabetes Coordinator Inpatient Diabetes Program  [phone] (Team Pager) [phone] [facility] Office) 03/22/2017 8:47 AM

## 2017-03-22 NOTE — Progress Notes (Signed)
Palliative to meet with patient's adult children this afternoon at 3:15pm.   NO CHARGE  Vennie HomansMegan Asaiah Hunnicutt, FNP-C Palliative Medicine Team  Phone: [phone] Fax: [phone]

## 2017-03-22 NOTE — Plan of Care (Signed)
  Progressing Education: Knowledge of General Education information will improve 03/22/2017 1259 - Progressing by Luretha MurphyMiles, Alveria Mcglaughlin, RN Health Behavior/Discharge Planning: Ability to manage health-related needs will improve 03/22/2017 1259 - Progressing by Luretha MurphyMiles, Macario Shear, RN Clinical Measurements: Diagnostic test results will improve 03/22/2017 1259 - Progressing by Luretha MurphyMiles, Paxson Harrower, RN Respiratory complications will improve 03/22/2017 1259 - Progressing by Luretha MurphyMiles, Cindra Austad, RN Cardiovascular complication will be avoided 03/22/2017 1259 - Progressing by Luretha MurphyMiles, Corrisa Gibby, RN Activity: Risk for activity intolerance will decrease 03/22/2017 1259 - Progressing by Luretha MurphyMiles, Yoshie Kosel, RN Nutrition: Adequate nutrition will be maintained 03/22/2017 1259 - Progressing by Luretha MurphyMiles, Robbi Spells, RN Coping: Level of anxiety will decrease 03/22/2017 1259 - Progressing by Luretha MurphyMiles, Jatorian Renault, RN Elimination: Will not experience complications related to bowel motility 03/22/2017 1259 - Progressing by Luretha MurphyMiles, Marketa Midkiff, RN Will not experience complications related to urinary retention 03/22/2017 1259 - Progressing by Luretha MurphyMiles, Akya Fiorello, RN Pain Managment: General experience of comfort will improve 03/22/2017 1259 - Progressing by Luretha MurphyMiles, Chrishawn Boley, RN Safety: Ability to remain free from injury will improve 03/22/2017 1259 - Progressing by Luretha MurphyMiles, Geraldyn Shain, RN Skin Integrity: Risk for impaired skin integrity will decrease 03/22/2017 1259 - Progressing by Luretha MurphyMiles, Frederich Montilla, RN Note Dressings bid to bilateral lower  legs Education: Ability to describe self-care measures that may prevent or decrease complications (Diabetes Survival Skills Education) will improve 03/22/2017 1259 - Progressing by Luretha MurphyMiles, Shanai Lartigue, RN Coping: Ability to adjust to condition or change in health will improve 03/22/2017 1259 - Progressing by Luretha MurphyMiles, Giana Castner, RN

## 2017-03-22 NOTE — Progress Notes (Signed)
[facility] Fredric MareBailey advised of current hospice home wait list. Patient information faxed to referral. Will follow up with patient and family in the morning. Dayna BarkerKaren robertson RN, BSN, [facility] [place], hospital Liaison [phone]

## 2017-03-22 NOTE — Progress Notes (Signed)
Clinical Social Worker (CSW) received a residential hospice referral. CSW met with patient's daughter/ HPOA [place] her sister and brother were at bedside. CSW presented hospice agency choice and they chose [facility]. [facility] liaison is aware of above. CSW will continue to follow and assist as needed.   [employer], LCSW [phone]

## 2017-03-22 NOTE — Progress Notes (Signed)
[facility]  ROUNDING NOTE   Subjective:   Angiogram for today.   Creatinine 1.87 (1.93)  NS at 56mL/hr  Objective:  Vital signs in last 24 hours:  Temp:  [97.7 F (36.5 C)-98 F (36.7 C)] 98 F (36.7 C) (01/31 0456) Pulse Rate:  [85-99] 91 (01/31 1130) Resp:  [10-15] 13 (01/31 1130) BP: (95-128)/(54-87) 117/85 (01/31 1130) SpO2:  [89 %-100 %] 98 % (01/31 1130) Weight:  [100.7 kg (222 lb)] 100.7 kg (222 lb) (01/31 0600)  Weight change: 4.128 kg (9 lb 1.6 oz) Filed Weights   03/20/17 0524 03/21/17 0306 03/22/17 0600  Weight: 96.4 kg (212 lb 8.4 oz) 96.6 kg (212 lb 14.4 oz) 100.7 kg (222 lb)    Intake/Output: I/O last 3 completed shifts: In: 1822.2 [P.O.:240; I.V.:1068.5; IV Piggyback:513.7] Out: 1550 [Urine:1550]   Intake/Output this shift:  Total I/O In: 0  Out: 400 [Urine:400]  Physical Exam: General: Ill appearing  Head: Normocephalic, atraumatic. Moist oral mucosal membranes  Eyes: Anicteric, PERRL  Neck: Supple, trachea midline  Lungs:  Clear to auscultation  Heart: Regular rate and rhythm  Abdomen:  Soft, nontender  Extremities: + peripheral edema. Bilateral lower extremity dressings  Neurologic: Confused.   Skin: No lesions       Basic Metabolic Panel: Recent Labs  Lab 03/18/17 0427 03/19/17 1115 03/20/17 1013 03/21/17 0705 03/22/17 1159  NA 135 132* 131* 131* 133*  K 3.7 3.8 3.8 4.2 4.2  CL 100* 96* 99* 99* 99*  CO2 25 24 22  18* 21*  GLUCOSE 171* 179* 126* 262* 274*  BUN 74* 69* 70* 70* 71*  CREATININE 1.83* 1.77* 2.08* 1.93* 1.87*  CALCIUM 8.2* 8.0* 8.1* 8.0* 8.3*    Liver Function Tests: No results for input(s): AST, ALT, ALKPHOS, BILITOT, PROT, ALBUMIN in the last 168 hours. No results for input(s): LIPASE, AMYLASE in the last 168 hours. No results for input(s): AMMONIA in the last 168 hours.  CBC: Recent Labs  Lab 03/18/17 0427 03/19/17 1115 03/20/17 1013 03/21/17 0705 03/22/17 1159  WBC 15.2* 14.5* 12.6*  11.0* 10.3  NEUTROABS 12.6* 12.2* 10.2* 9.2* 9.3*  HGB 14.7 14.4 14.5 14.3 14.9  HCT 45.1 43.7 43.5 43.6 45.5  MCV 98.8 97.7 98.4 99.5 100.3*  PLT 150 160 176 181 232    Cardiac Enzymes: Recent Labs  Lab 03/17/17 0401  CKTOTAL 109    BNP: Invalid input(s): POCBNP  CBG: Recent Labs  Lab 03/21/17 1712 03/21/17 2153 03/22/17 0814 03/22/17 1108 03/22/17 1213  GLUCAP 202* 189* 256* 226* 219*    Microbiology: Results for orders placed or performed during the hospital encounter of 03/15/17  Culture, blood (Routine x 2)     Status: Abnormal   Collection Time: 03/15/17  9:29 AM  Result Value Ref Range Status   Specimen Description   Final    BLOOD LEFT FA Performed at [facility], [address] [place], [place] [address]    Special Requests   Final    BOTTLES DRAWN AEROBIC AND ANAEROBIC Blood Culture adequate volume Performed at [facility], [address] [place], [place] [address]    Culture  Setup Time   Final    GRAM NEGATIVE RODS IN BOTH AEROBIC AND ANAEROBIC BOTTLES CRITICAL RESULT CALLED TO, READ BACK BY AND VERIFIED WITH: MATT MCBANE AT 0307 ON 03/16/17 [facility].    Culture KLEBSIELLA OXYTOCA (A)  Final   Report Status 03/18/2017 FINAL  Final   Organism ID, Bacteria KLEBSIELLA OXYTOCA  Final      Susceptibility   Klebsiella oxytoca - MIC*    AMPICILLIN >=32 RESISTANT Resistant     CEFAZOLIN <=4 SENSITIVE Sensitive     CEFEPIME <=1 SENSITIVE Sensitive     CEFTAZIDIME <=1 SENSITIVE Sensitive     CEFTRIAXONE <=1 SENSITIVE Sensitive     CIPROFLOXACIN <=0.25 SENSITIVE Sensitive     GENTAMICIN <=1 SENSITIVE Sensitive     IMIPENEM <=0.25 SENSITIVE Sensitive     TRIMETH/SULFA <=20 SENSITIVE Sensitive     AMPICILLIN/SULBACTAM 16 INTERMEDIATE Intermediate     PIP/TAZO <=4 SENSITIVE Sensitive     Extended ESBL NEGATIVE Sensitive     * KLEBSIELLA OXYTOCA  Culture, blood (Routine x 2)     Status: Abnormal   Collection Time: 03/15/17  9:29 AM   Result Value Ref Range Status   Specimen Description   Final    BLOOD RIGHT ANTECUBITAL Performed at [facility], [address] [place], [place] [address]    Special Requests   Final    BOTTLES DRAWN AEROBIC AND ANAEROBIC Blood Culture results may not be optimal due to an excessive volume of blood received in culture bottles Performed at [facility], [address] [place], [place] [address]    Culture  Setup Time   Final    GRAM NEGATIVE RODS IN BOTH AEROBIC AND ANAEROBIC BOTTLES CRITICAL VALUE NOTED.  VALUE IS CONSISTENT WITH PREVIOUSLY REPORTED AND CALLED VALUE. Performed at [facility], [address] [place], [place] [address]    Culture (A)  Final    KLEBSIELLA OXYTOCA SUSCEPTIBILITIES PERFORMED ON PREVIOUS CULTURE WITHIN THE LAST 5 DAYS. Performed at [facility], [address] [place], [place] [address]    Report Status 03/19/2017 FINAL  Final  Urine culture     Status: Abnormal   Collection Time: 03/15/17  9:29 AM  Result Value Ref Range Status   Specimen Description   Final    URINE, CATHETERIZED Performed at [facility], [address] [place], [place] [address]    Special Requests   Final    NONE Performed at [facility], [address] [place], [place] [address]    Culture 70,000 COLONIES/mL KLEBSIELLA PNEUMONIAE (A)  Final   Report Status 03/18/2017 FINAL  Final   Organism ID, Bacteria KLEBSIELLA PNEUMONIAE (A)  Final      Susceptibility   Klebsiella pneumoniae - MIC*    AMPICILLIN >=32 RESISTANT Resistant     CEFAZOLIN <=4 SENSITIVE Sensitive     CEFTRIAXONE <=1 SENSITIVE Sensitive     CIPROFLOXACIN <=0.25 SENSITIVE Sensitive     GENTAMICIN <=1 SENSITIVE Sensitive     IMIPENEM <=0.25 SENSITIVE Sensitive     NITROFURANTOIN 64 INTERMEDIATE Intermediate     TRIMETH/SULFA <=20 SENSITIVE Sensitive     AMPICILLIN/SULBACTAM 4 SENSITIVE Sensitive     PIP/TAZO <=4 SENSITIVE Sensitive     Extended  ESBL NEGATIVE Sensitive     * 70,000 COLONIES/mL KLEBSIELLA PNEUMONIAE  Blood Culture ID Panel (Reflexed)     Status: Abnormal   Collection Time: 03/15/17  9:29 AM  Result Value Ref Range Status   Enterococcus species NOT DETECTED NOT DETECTED Final   Vancomycin resistance NOT DETECTED NOT DETECTED Final   Listeria monocytogenes NOT DETECTED NOT DETECTED Final   Staphylococcus species NOT DETECTED NOT DETECTED Final   Staphylococcus aureus NOT DETECTED NOT DETECTED Final   Methicillin resistance NOT DETECTED NOT DETECTED Final   Streptococcus species NOT DETECTED  NOT DETECTED Final   Streptococcus agalactiae NOT DETECTED NOT DETECTED Final   Streptococcus pneumoniae NOT DETECTED NOT DETECTED Final   Streptococcus pyogenes NOT DETECTED NOT DETECTED Final   Acinetobacter baumannii NOT DETECTED NOT DETECTED Final   Enterobacteriaceae species DETECTED (A) NOT DETECTED Final    Comment: CRITICAL RESULT CALLED TO, READ BACK BY AND VERIFIED WITH: MATT MCBANE AT 0307 ON 03/16/17 [facility]. Enterobacteriaceae represent a large family of gram-negative bacteria, not a single organism.    Enterobacter cloacae complex NOT DETECTED NOT DETECTED Final   Escherichia coli NOT DETECTED NOT DETECTED Final   Klebsiella oxytoca DETECTED (A) NOT DETECTED Final    Comment: CRITICAL RESULT CALLED TO, READ BACK BY AND VERIFIED WITH: MATT MCBANE AT 0307 ON 03/16/17 [facility].    Klebsiella pneumoniae NOT DETECTED NOT DETECTED Final   Proteus species NOT DETECTED NOT DETECTED Final   Serratia marcescens NOT DETECTED NOT DETECTED Final   Carbapenem resistance NOT DETECTED NOT DETECTED Final   Haemophilus influenzae NOT DETECTED NOT DETECTED Final   Neisseria meningitidis NOT DETECTED NOT DETECTED Final   Pseudomonas aeruginosa NOT DETECTED NOT DETECTED Final   Candida albicans NOT DETECTED NOT DETECTED Final   Candida glabrata NOT DETECTED NOT DETECTED Final   Candida krusei NOT DETECTED NOT DETECTED Final   Candida  parapsilosis NOT DETECTED NOT DETECTED Final   Candida tropicalis NOT DETECTED NOT DETECTED Final    Comment: Performed at [facility], [address], [place], [place] [address]  Aerobic/Anaerobic Culture (surgical/deep wound)     Status: None   Collection Time: 03/15/17 11:15 AM  Result Value Ref Range Status   Specimen Description   Final    LEG Performed at [facility], [address] [place], [place] [address]    Special Requests   Final    NONE Performed at [facility], [address] [place], [place] [address]    Gram Stain   Final    RARE WBC PRESENT, PREDOMINANTLY PMN ABUNDANT GRAM NEGATIVE RODS RARE GRAM POSITIVE COCCI    Culture   Final    ABUNDANT KLEBSIELLA OXYTOCA MODERATE STENOTROPHOMONAS MALTOPHILIA NO ANAEROBES ISOLATED Performed at [facility], [address], [place], [place] [address]    Report Status 03/19/2017 FINAL  Final   Organism ID, Bacteria STENOTROPHOMONAS MALTOPHILIA  Final      Susceptibility   Stenotrophomonas maltophilia - MIC*    LEVOFLOXACIN 0.25 SENSITIVE Sensitive     TRIMETH/SULFA <=20 SENSITIVE Sensitive     * MODERATE STENOTROPHOMONAS MALTOPHILIA  MRSA PCR Screening     Status: None   Collection Time: 03/15/17  6:30 PM  Result Value Ref Range Status   MRSA by PCR NEGATIVE NEGATIVE Final    Comment:        The GeneXpert MRSA Assay (FDA approved for NASAL specimens only), is one component of a comprehensive MRSA colonization surveillance program. It is not intended to diagnose MRSA infection nor to guide or monitor treatment for MRSA infections. Performed at [facility], [address] [place], [place] [address]   Cath Tip Culture     Status: None   Collection Time: 03/18/17  2:15 PM  Result Value Ref Range Status   Specimen Description   Final    CATH TIP Performed at [facility], [address], [place], [place] [address]    Special Requests   Final     NONE Performed at [facility], [address]  [place], [place] [address]    Culture   Final    NO GROWTH 3 DAYS Performed at [facility], [address] [place], [place] [address]    Report Status 03/22/2017 FINAL  Final    Coagulation Studies: No results for input(s): LABPROT, INR in the last 72 hours.  Urinalysis: No results for input(s): COLORURINE, LABSPEC, PHURINE, GLUCOSEU, HGBUR, BILIRUBINUR, KETONESUR, PROTEINUR, UROBILINOGEN, NITRITE, LEUKOCYTESUR in the last 72 hours.  Invalid input(s): APPERANCEUR    Imaging: No results found.   Medications:   . sodium chloride 75 mL/hr at 03/22/17 0847  . DAPTOmycin (CUBICIN)  IV Stopped (03/21/17 1811)  . levofloxacin (LEVAQUIN) IV 750 mg (03/22/17 1334)  . metronidazole Stopped (03/22/17 0251)   . aspirin EC  81 mg Oral Daily  . bacitracin   Topical BID  . bacitracin   Topical Daily  . docusate sodium  100 mg Oral BID  . enoxaparin (LOVENOX) injection  40 mg Subcutaneous Q24H  . Gerhardt's butt cream   Topical BID  . insulin aspart  0-5 Units Subcutaneous QHS  . insulin aspart  0-9 Units Subcutaneous TID WC  . insulin aspart  4 Units Subcutaneous TID WC  . insulin glargine  5 Units Subcutaneous Daily  . methylPREDNISolone (SOLU-MEDROL) injection  80 mg Intravenous Q8H  . montelukast  10 mg Oral Daily  . multivitamin with minerals  1 tablet Oral Daily  . tamsulosin  0.4 mg Oral Daily  . [START ON 04/14/2017] Vitamin D (Ergocalciferol)  50,000 Units Oral Q30 days   acetaminophen **OR** acetaminophen, bisacodyl, diphenhydrAMINE, ondansetron **OR** ondansetron (ZOFRAN) IV, polyethylene glycol, traMADol  Assessment/ Plan:  [name] is a 67 y.o. black male cardiomyopathy, congestive heart failure, coronary artery disease, insulin dependent diabetes mellitus type 2, hyperlipidemia, hypertension, obstructive sleep apnea, peripheral vascular disease, pulmonary hypertension, tobacco abuse   1.  Chronic Kidney Disease stage III - IV: with nephrotic range proteinuria and hematuria:  Chronic kidney disease secondary to diabetic nephropathy.  - Agree with IV fluids and steroids - Attempt to use as little dye as possible.  - Patient in his current condition is most likely not a candidate for dialysis.   2. Sepsis with endocarditis - daptomycin - Appreciate vascular and Infectious disease work up.   3. Hypertension with edema: blood pressure hypotensive. History of difficult to control. Currently on tamsulosin.    LOS: 7 Tamika Shropshire 1/31/20192:41 PM

## 2017-03-22 NOTE — Progress Notes (Signed)
Daily Progress Note   Patient Name: Jeremy Gutierrez       Date: 03/22/2017 DOB: [birth year]  Age: 67 y.o. MRN#: [id number] Attending Physician: Nicholes Mango, MD Primary Care Physician: Marden Noble, MD Admit Date: 03/15/2017  Reason for Consultation/Follow-up: Establishing goals of care and Terminal Care  Subjective: Patient wakes to voice but lethargic this afternoon. Angiogram today. He denies pain. He is asking for breakfast. I re-oriented him and explained that dinner should be in soon. He is not able to participate in [place] conversation with daughters and son.   GOC:  I have reviewed medical records, discussed with care team, and met with daughters (Jeremy Gutierrez and Jeremy Gutierrez) and son Jeremy Gutierrez) in family waiting room to discuss diagnosis prognosis, [place], EOL wishes, disposition and options.  Introduced Palliative Medicine as specialized medical care for people living with serious illness. It focuses on providing relief from the symptoms and stress of a serious illness.   We discussed a brief life review of the patient. Recalled my conversation with Jeremy Gutierrez on Monday. The patient was diagnosed with endocarditis in December and has been at [facility] for rehab. Prior to this, independent and travelling frequently within the last 6 months. Jeremy Gutierrez was unaware of [name]'s wounds until hospitalization in December. He has been private about his health information.   Discussed diagnoses, interventions, and underlying co-morbidities of DM and CHF. After discussion, French Ana, and Thaxton have a good understanding that their father is very sick from multiple infections including endocarditis with weak heart. He has been followed by Dr. Ola Spurr and has not shown improvement despite aggressive antibiotics.  Explained poor prognosis, likely two weeks or less once antibiotics and IVF are discontinued.   He does not have a living will or spoken of his wishes in regards to aggressive medical interventions. I educated on medical recommendation for DNR/DNI with age, current clinical condition, co-morbidities, and poor outcomes of CPR.   Macsen speaks of wanting him comfortable if there is "nothing else" we can do. He understands his father has not responded to aggressive IV antibiotics. Jeremy Gutierrez speaks of wanting him to be free of pain. All children agree they do not want him to suffer and feel he has poor quality of life in the last few months. They do not feel he would want to live with  poor quality of life.   The difference between aggressive medical intervention and comfort care was considered in light of the patient's goals of care. Educated on recommendation for comfort measures only in order to allow comfort and dignity at EOL. Children agree to discontinue aggressive interventions and focus on comfort. They understand medications/labs/interventions not aimed at comfort will be discontinued including IVF and antibiotics.   Introduced and completed MOST form with children. DNR/DNI, comfort measures only, no further IVF/ABX, no feeding tube. Durable DNR completed and copies made for family.   Hospice services outpatient were explained and offered. Family requesting residential hospice, understanding 2 week or less prognosis.   Jeremy Gutierrez and Remer believe their father knows he is dying. Drew had a spiritual conversation with her father this week (which he never talks about) and she believes that he is at peace with God.   Questions and concerns were addressed. Emotional/spiritual support provided to daughters who are very tearful. PMT contact information given.   Length of Stay: 7  Current Medications: Scheduled Meds:  . bacitracin   Topical BID  . bacitracin   Topical Daily  . Gerhardt's butt cream   Topical  BID    Continuous Infusions:  PRN Meds: acetaminophen **OR** acetaminophen, bisacodyl, diphenhydrAMINE, glycopyrrolate, LORazepam, morphine injection, morphine CONCENTRATE, ondansetron **OR** ondansetron (ZOFRAN) IV, polyethylene glycol  Physical Exam  Constitutional: He is easily aroused. He appears ill.  HENT:  Head: Normocephalic and atraumatic.  Pulmonary/Chest: No accessory muscle usage. No tachypnea. No respiratory distress.  Abdominal: There is no tenderness.  Neurological: He is easily aroused.  Wakes to voice, pleasantly confused, very drowsy  Skin:  BLE leg wounds  Psychiatric: His speech is delayed. Cognition and memory are impaired. He is inattentive.  Nursing note and vitals reviewed.          Vital Signs: BP 110/77 (BP Location: Left Arm)   Pulse 90   Temp (!) 97.4 F (36.3 C) (Oral)   Resp (!) 24   Ht _0  (1.803 m)   Wt 100.7 kg (222 lb)   SpO2 100%   BMI 30.96 kg/m  SpO2: SpO2: 100 % O2 Device: O2 Device: Nasal Cannula O2 Flow Rate: O2 Flow Rate (L/min): 2 L/min  Intake/output summary:   Intake/Output Summary (Last 24 hours) at 03/22/2017 1703 Last data filed at 03/22/2017 1422 Gross per 24 hour  Intake 1482.21 ml  Output 1300 ml  Net 182.21 ml   LBM: Last BM Date: 03/21/17 Baseline Weight: Weight: 93.9 kg (207 lb) Most recent weight: Weight: 100.7 kg (222 lb)       Palliative Assessment/Data: PPS 30%   Flowsheet Rows     Most Recent Value  Intake Tab  Referral Department  Hospitalist  Unit at Time of Referral  Med/Surg Unit  Palliative Care Primary Diagnosis  Sepsis/Infectious Disease  Date Notified  03/15/17  Palliative Care Type  New Palliative care  Reason for referral  Clarify Goals of Care  Date of Admission  03/15/17  Date first seen by Palliative Care  03/19/17  # of days Palliative referral response time  4 Day(s)  # of days IP prior to Palliative referral  0  Clinical Assessment  Palliative Performance Scale Score  30%    Psychosocial & Spiritual Assessment  Palliative Care Outcomes  Patient/Family meeting held?  Yes  Who was at the meeting?  daughters (Jeremy Gutierrez and Jeremy Gutierrez) and son Buffa)  [place] goals of care, Counseled regarding hospice, Improved pain  interventions, Improved non-pain symptom therapy, Provided end of life care assistance, Provided advance care planning, Provided psychosocial or spiritual support, Changed to focus on comfort, Changed CPR status, Completed durable DNR, Transitioned to hospice, ACP counseling assistance      Patient Active Problem List   Diagnosis Date Noted  . Cellulitis   . Acute endocarditis   . Acute on chronic diastolic congestive heart failure ([place])   . Renal insufficiency   . Palliative care by specialist   . Goals of care, counseling/discussion   . Dementia 02/15/2017  . Pressure injury of skin 02/13/2017  . MRSA bacteremia 02/11/2017  . Sepsis ([place]) 02/09/2017  . Tobacco use disorder 01/23/2017  . Hyperglycemia 04/02/2016  . Essential hypertension, benign 02/29/2016  . AAA (abdominal aortic aneurysm) without rupture ([place]) 02/29/2016  . Diabetes ([place]) 02/29/2016  . Abscess of right thigh 10/29/2012    Palliative Care Assessment & Plan   Patient Profile:  67 y.o. male  with past medical history of CHF, diabetes mellitus, heart murmur, sleep apnea, arthritis, and aneurysm admitted on 03/15/2017 with fever. 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 Followed by ID and was to receive IV vancomycin through February 6th. ID, vascular surgery, and cardiology following. 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 Patient is a poor candidate for angiography. Receiving antibiotics. Also with chronic diastolic heart failure, severe mitral stenosis with mitral regurgitation, and acute on chronic kidney disease stage III.  Palliative medicine consultation for goals of care.   Assessment: Sepsis BLE chronic wounds RLE cellulitis MSSA and staph hemolytic bacteremia Endocarditis Moderate to severe MS and MR Acute on chronic kidney disease stage III Sacral pressure ulcer  Recommendations/Plan:  After discussion with three adult children, decision is made to transition to comfort measures only.   MOST form completed. DNR/DNI, comfort measures, no further ABX/IVF, no feeding tube.   Durable DNR completed.   Discontinued labs, interventions, medications not aimed at comfort. Children confirm understanding.  Symptom management  Morphine 30m IV q2h prn pain/dyspnea/air hunger  Roxanol 1340mSL q2h prn pain/dyspnea/air hunger  Ativan 40m66mV q4h prn anxiety  Robinul 0.2mg44m q4h prn secretions  SW consult for residential hospice placement.   Wound care as needed.  Comfort feeds per patient/family request.   Updated Dr. GourMargaretmary Eddy Dr. FitzOla Spurr Code Status: DNR/DNI   Code Status Orders  (From admission, onward)        Start     Ordered   03/15/17 1344  Full code  Continuous     03/15/17 1344    Code Status History    Date Active Date Inactive Code Status Order ID Comments User Context   02/12/2017 01:03 02/18/2017 19:32 Full Code [id number] Harms Inpatient   02/09/2017 16:28 02/11/2017 12:29 Full Code [id number] Loll Inpatient   04/02/2016 12:41 04/04/2016 22:49 Full Code [id number] Foreman ED       Prognosis:  Less than 2 weeks with sepsis secondary to chronic BLE wounds, bacteremia, endocarditis, acute on chronic kidney disease. High risk for decompensation. Discontinuation of life prolonging measures including IVF/ABX.   Discharge Planning:  Hospice facility  Care plan was discussed with patient, two daughters, son, RN, Dr GourMargaretmary Eddy. FitzOla Spurrank you for allowing the Palliative Medicine Team to assist in the care of this  patient.   Time  In: 1515 Time Out: 1650 Total Time 131mn Prolonged Time Billed yes      Greater than 50%  of this time was spent counseling and coordinating care related to the above assessment and plan.  MIhor Dow FNP-C Palliative Medicine Team  Phone: [phone]: [phone] Please contact Palliative Medicine Team phone at [phone] questions and concerns.

## 2017-03-22 NOTE — H&P (Signed)
[facility] History & Physical Update  The patient was interviewed and re-examined.  The patient's previous History and Physical has been reviewed and is unchanged.  There is no change in the plan of care. We plan to proceed with the scheduled procedure.  Festus BarrenJason Dew, MD  03/22/2017, 9:19 AM

## 2017-03-23 MED ORDER — GLYCOPYRROLATE 0.2 MG/ML IJ SOLN: 0.2000 mg | INTRAMUSCULAR | 0 refills | Status: AC | PRN

## 2017-03-23 MED ORDER — POLYETHYLENE GLYCOL 3350 17 G PO PACK: 17.0000 g | PACK | Freq: Every day | ORAL | 0 refills | Status: AC | PRN

## 2017-03-23 MED ORDER — LORAZEPAM 1 MG PO TABS: 1.0000 mg | ORAL_TABLET | Freq: Four times a day (QID) | ORAL | 0 refills | Status: AC | PRN

## 2017-03-23 MED ORDER — DIPHENHYDRAMINE HCL 25 MG PO CAPS: 25.0000 mg | ORAL_CAPSULE | Freq: Four times a day (QID) | ORAL | 0 refills | Status: DC | PRN

## 2017-03-23 MED ORDER — GERHARDT'S BUTT CREAM: 1.0000 "application " | TOPICAL_CREAM | Freq: Two times a day (BID) | CUTANEOUS | Status: AC

## 2017-03-23 MED ORDER — MORPHINE SULFATE (CONCENTRATE) 10 MG/0.5ML PO SOLN: 10.0000 mg | ORAL | 0 refills | Status: AC | PRN

## 2017-03-23 MED ORDER — ACETAMINOPHEN 325 MG PO TABS: 650.0000 mg | ORAL_TABLET | Freq: Four times a day (QID) | ORAL | Status: AC | PRN

## 2017-03-23 MED ORDER — ONDANSETRON HCL 4 MG PO TABS: 4.0000 mg | ORAL_TABLET | Freq: Four times a day (QID) | ORAL | 0 refills | Status: AC | PRN

## 2017-03-23 MED ORDER — MORPHINE SULFATE (PF) 2 MG/ML IV SOLN: 2.0000 mg | INTRAVENOUS | 0 refills | Status: AC | PRN

## 2017-03-23 NOTE — Discharge Summary (Signed)
[facility] - [place] at [facility]   PATIENT NAME: Jeremy Gutierrez    MR#:  [id number]  DATE OF BIRTH:  1951-01-12  DATE OF ADMISSION:  03/15/2017 ADMITTING PHYSICIAN: Adrian Saran, MD  DATE OF DISCHARGE:  03/23/17  PRIMARY CARE PHYSICIAN: Derwood Kaplan, MD    ADMISSION DIAGNOSIS:  Open wound [T14.8XXA] Hyponatremia [E87.1] Acute renal insufficiency [N28.9] Elevated troponin [R74.8] Sepsis, due to unspecified organism (HCC) [A41.9] Cellulitis, unspecified cellulitis site [L03.90]  DISCHARGE DIAGNOSIS:  Active Problems:   Sepsis (HCC)   Cellulitis   Acute endocarditis   Acute on chronic diastolic congestive heart failure [facility])   Renal insufficiency   Palliative care by specialist   Goals of care, counseling/discussion   DNR (do not resuscitate)   SECONDARY DIAGNOSIS:   Past Medical History:  Diagnosis Date  . Aneurysm (HCC)   . Arthritis   . CHF (congestive heart failure) (HCC)   . Diabetes mellitus without complication (HCC)   . Heart murmur    as a child- 'nothing to worry about'  . Sleep apnea     HOSPITAL COURSE:  hpi  Andreus Cure  is a 67 y.o. 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 On arrival to the emergency room patient had temperature 102.7, tachycardia and tachypnea.  Patient was started on Zosyn and vancomycin for sepsis.      1. Sepsis: Patient presents with fever, tachypnea, tachycardia and  with hypotension at the time of admission Sepsis is likely due to right lower extremity cellulitis and chronic wounds Urine culture with Klebsiella pneumonia PICC line culture with no growth  wound culture with KLEBSIELLA OXYTOCA and STENOTROPHOMONAS MALTOPHILIA .  Patient had left lower  extremity angiogram today by vascular surgery.  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 Acute metabolic encephalopathy due tosepsisand underlying dementia  4.Diabetes: 5. Chronic diastolic heart failure with preserved ejection fraction:  6.Acute onChronic kidney disease stage III: with h/o nephrotic syndrome    7BPH: Continue Flomax  8.  Sacral pressure ulcer: Continue wound care and reposition patient.  Wound care is following  osa-cpap  Poor prognosis  palliative care followed.  Had a family meeting and family members anonymously decided to DNR/DNI with comfort care measures.  Antibiotics were discontinued Comfort care measures implemented  hospice home placement today     DISCHARGE CONDITIONS:   guarded  CONSULTS OBTAINED:  Treatment Team:  Mick Sell, MD Schnier, Latina Craver, MD Alwyn Pea, MD Lamont Dowdy, MD   PROCEDURES angiogram left lower extremity status post stent placement  DRUG ALLERGIES:   Allergies  Allergen Reactions  . Sulfa Antibiotics Hives    Patient states he is not allergic to this medication    DISCHARGE MEDICATIONS:   Allergies as of 03/23/2017      Reactions   Sulfa Antibiotics Hives   Patient states he is not allergic to this medication      Medication List    STOP taking these medications   aspirin 81 MG tablet   carvedilol 3.125 MG tablet Commonly known as:  COREG   insulin lispro protamine-lispro (75-25) 100 UNIT/ML Susp injection Commonly known as:  HUMALOG 75/25 MIX   ONE TOUCH ULTRA TEST test strip Generic  drug:  glucose blood   simvastatin 20 MG tablet Commonly known as:  ZOCOR   vancomycin 1-5 GM/200ML-% Soln Commonly known as:  VANCOCIN   Vitamin D (Ergocalciferol) 50000 units Caps capsule Commonly known as:  DRISDOL     TAKE these medications   acetaminophen 325 MG tablet Commonly known as:   TYLENOL Take 2 tablets (650 mg total) by mouth every 6 (six) hours as needed for mild pain (or Fever >/= 101).   collagenase ointment Commonly known as:  SANTYL Apply 1 application topically daily as needed.   Gerhardt's butt cream Crea Apply 1 application topically 2 (two) times daily.   glycopyrrolate 0.2 MG/ML injection Commonly known as:  ROBINUL Inject 1 mL (0.2 mg total) into the vein every 4 (four) hours as needed (secretions).   hydrOXYzine 25 MG tablet Commonly known as:  ATARAX/VISTARIL TAKE 1 TO 2 TABLETS BY MOUTH AT BEDTIME IF NEEDED FOR ITCHING   LORazepam 1 MG tablet Commonly known as:  ATIVAN Take 1 tablet (1 mg total) by mouth every 6 (six) hours as needed for anxiety.   montelukast 10 MG tablet Commonly known as:  SINGULAIR take 1 tablet daily   morphine 2 MG/ML injection Inject 1 mL (2 mg total) into the vein every 2 (two) hours as needed (dyspnea/air hunger).   morphine CONCENTRATE 10 MG/0.5ML Soln concentrated solution Take 0.5 mLs (10 mg total) by mouth every 2 (two) hours as needed for moderate pain, severe pain or shortness of breath (air hunger).   ondansetron 4 MG tablet Commonly known as:  ZOFRAN Take 1 tablet (4 mg total) by mouth every 6 (six) hours as needed for nausea.   polyethylene glycol packet Commonly known as:  MIRALAX / GLYCOLAX Take 17 g by mouth daily as needed for mild constipation.   tamsulosin 0.4 MG Caps capsule Commonly known as:  FLOMAX Take 0.4 mg by mouth daily.   torsemide 20 MG tablet Commonly known as:  DEMADEX Take 3 tablets (60 mg total) by mouth daily. What changed:    how much to take  when to take this        DISCHARGE INSTRUCTIONS:    Patient is getting transferred to hospice home DIET:  Diabetic diet  DISCHARGE CONDITION:  Fair  ACTIVITY:  Activity as tolerated  OXYGEN:  Home Oxygen: No.   Oxygen Delivery: room air  DISCHARGE LOCATION:  Hospice home  If you experience worsening of  your admission symptoms, develop shortness of breath, life threatening emergency, suicidal or homicidal thoughts you must seek medical attention immediately by calling 911 or calling your MD immediately  if symptoms less severe.  You Must read complete instructions/literature along with all the possible adverse reactions/side effects for all the Medicines you take and that have been prescribed to you. Take any new Medicines after you have completely understood and accpet all the possible adverse reactions/side effects.   Please note  You were cared for by a hospitalist during your hospital stay. If you have any questions about your discharge medications or the care you received while you were in the hospital after you are discharged, you can call the unit and asked to speak with the hospitalist on call if the hospitalist that took care of you is not available. Once you are discharged, your primary care physician will handle any further medical issues. Please note that NO REFILLS for any discharge medications will be authorized once you are discharged, as it is imperative that you return  to your primary care physician (or establish a relationship with a primary care physician if you do not have one) for your aftercare needs so that they can reassess your need for medications and monitor your lab values.     Today  Chief Complaint  Patient presents with  . Altered Mental Status   Patient denies any complaints, agreeable to hospice home  ROS:  CONSTITUTIONAL: has  fatigue, weakness.  RESPIRATORY: Denies cough, wheeze, shortness of breath.  CARDIOVASCULAR: Denies chest pain, palpitations, edema.  GASTROINTESTINAL: Denies nausea, vomiting, diarrhea, abdominal pain. Denies bright red blood per rectum. GENITOURINARY: Denies dysuria, hematuria. ENDOCRINE: Denies nocturia or thyroid problems. HEMATOLOGIC AND LYMPHATIC: Denies easy bruising or bleeding. SKIN: Chronic bilateral lower extremity  wounds MUSCULOSKELETAL: Denies pain in neck, back, shoulder, knees, hips or arthritic symptoms.  NEUROLOGIC: Denies paralysis, paresthesias.     VITAL SIGNS:  Blood pressure 104/73, pulse 82, temperature (!) 97.5 F (36.4 C), resp. rate 20, height 5\' 11"  (1.803 m), weight 100.7 kg (222 lb), SpO2 98 %.  I/O:    Intake/Output Summary (Last 24 hours) at 03/23/2017 1129 Last data filed at 03/23/2017 1006 Gross per 24 hour  Intake 120 ml  Output 1100 ml  Net -980 ml    PHYSICAL EXAMINATION:  GENERAL:  67 y.o.-year-old patient lying in the bed with no acute distress.  EYES: Pupils equal, round, reactive to light and accommodation. No scleral icterus. Extraocular muscles intact.  HEENT: Head atraumatic, normocephalic. Oropharynx and nasopharynx clear.  NECK:  Supple, no jugular venous distention. No thyroid enlargement, no tenderness.  LUNGS: Normal breath sounds bilaterally, no wheezing, rales,rhonchi or crepitation. No use of accessory muscles of respiration.  CARDIOVASCULAR: S1, S2 normal. No murmurs, rubs, or gallops.  ABDOMEN: Soft, non-tender, non-distended. Bowel sounds present. No organomegaly or mass.  EXTREMITIES: Chronic bilateral lower extremity wounds and left lower extremity status post stent placement .  NEUROLOGIC: Arousable, alert, oriented x2 PSYCHIATRIC: The patient is alert and oriented x 2.  SKIN: No obvious rash, lesion, or ulcer.   DATA REVIEW:   CBC Recent Labs  Lab 03/22/17 1159  WBC 10.3  HGB 14.9  HCT 45.5  PLT 232    Chemistries  Recent Labs  Lab 03/22/17 1159  NA 133*  K 4.2  CL 99*  CO2 21*  GLUCOSE 274*  BUN 71*  CREATININE 1.87*  CALCIUM 8.3*    Cardiac Enzymes No results for input(s): TROPONINI in the last 168 hours.  Microbiology Results  Results for orders placed or performed during the hospital encounter of 03/15/17  Culture, blood (Routine x 2)     Status: Abnormal   Collection Time: 03/15/17  9:29 AM  Result Value Ref Range  Status   Specimen Description   Final    BLOOD LEFT FA Performed at [facility], [address], [place], [place] [address]    Special Requests   Final    BOTTLES DRAWN AEROBIC AND ANAEROBIC Blood Culture adequate volume Performed at [facility], [address], [place], [place] [address]    Culture  Setup Time   Final    GRAM NEGATIVE RODS IN BOTH AEROBIC AND ANAEROBIC BOTTLES CRITICAL RESULT CALLED TO, READ BACK BY AND VERIFIED WITH: MATT MCBANE AT 0307 ON 03/16/17 [facility].    Culture KLEBSIELLA OXYTOCA (A)  Final   Report Status 03/18/2017 FINAL  Final   Organism ID, Bacteria KLEBSIELLA OXYTOCA  Final      Susceptibility   Klebsiella oxytoca -  MIC*    AMPICILLIN >=32 RESISTANT Resistant     CEFAZOLIN <=4 SENSITIVE Sensitive     CEFEPIME <=1 SENSITIVE Sensitive     CEFTAZIDIME <=1 SENSITIVE Sensitive     CEFTRIAXONE <=1 SENSITIVE Sensitive     CIPROFLOXACIN <=0.25 SENSITIVE Sensitive     GENTAMICIN <=1 SENSITIVE Sensitive     IMIPENEM <=0.25 SENSITIVE Sensitive     TRIMETH/SULFA <=20 SENSITIVE Sensitive     AMPICILLIN/SULBACTAM 16 INTERMEDIATE Intermediate     PIP/TAZO <=4 SENSITIVE Sensitive     Extended ESBL NEGATIVE Sensitive     * KLEBSIELLA OXYTOCA  Culture, blood (Routine x 2)     Status: Abnormal   Collection Time: 03/15/17  9:29 AM  Result Value Ref Range Status   Specimen Description   Final    BLOOD RIGHT ANTECUBITAL Performed at [facility], [address] [place], [place] [address]    Special Requests   Final    BOTTLES DRAWN AEROBIC AND ANAEROBIC Blood Culture results may not be optimal due to an excessive volume of blood received in culture bottles Performed at [facility], [address] [place], [place] [address]    Culture  Setup Time   Final    GRAM NEGATIVE RODS IN BOTH AEROBIC AND ANAEROBIC BOTTLES CRITICAL VALUE NOTED.  VALUE IS CONSISTENT WITH PREVIOUSLY REPORTED AND CALLED VALUE. Performed at [facility], [address] [place], [place] [address]    Culture (A)  Final    KLEBSIELLA OXYTOCA SUSCEPTIBILITIES PERFORMED ON PREVIOUS CULTURE WITHIN THE LAST 5 DAYS. Performed at [facility], [address] [place], [place] [address]    Report Status 03/19/2017 FINAL  Final  Urine culture     Status: Abnormal   Collection Time: 03/15/17  9:29 AM  Result Value Ref Range Status   Specimen Description   Final    URINE, CATHETERIZED Performed at [facility], [address] [place], [place] [address]    Special Requests   Final    NONE Performed at [facility], [address] [place], [place] [address]    Culture 70,000 COLONIES/mL KLEBSIELLA PNEUMONIAE (A)  Final   Report Status 03/18/2017 FINAL  Final   Organism ID, Bacteria KLEBSIELLA PNEUMONIAE (A)  Final      Susceptibility   Klebsiella pneumoniae - MIC*    AMPICILLIN >=32 RESISTANT Resistant     CEFAZOLIN <=4 SENSITIVE Sensitive     CEFTRIAXONE <=1 SENSITIVE Sensitive     CIPROFLOXACIN <=0.25 SENSITIVE Sensitive     GENTAMICIN <=1 SENSITIVE Sensitive     IMIPENEM <=0.25 SENSITIVE Sensitive     NITROFURANTOIN 64 INTERMEDIATE Intermediate     TRIMETH/SULFA <=20 SENSITIVE Sensitive     AMPICILLIN/SULBACTAM 4 SENSITIVE Sensitive     PIP/TAZO <=4 SENSITIVE Sensitive     Extended ESBL NEGATIVE Sensitive     * 70,000 COLONIES/mL KLEBSIELLA PNEUMONIAE  Blood Culture ID Panel (Reflexed)     Status: Abnormal   Collection Time: 03/15/17  9:29 AM  Result Value Ref Range Status   Enterococcus species NOT DETECTED NOT DETECTED Final   Vancomycin resistance NOT DETECTED NOT DETECTED Final   Listeria monocytogenes NOT DETECTED NOT DETECTED Final   Staphylococcus species NOT DETECTED NOT DETECTED Final   Staphylococcus aureus NOT DETECTED NOT DETECTED Final   Methicillin resistance NOT DETECTED NOT DETECTED Final   Streptococcus species NOT DETECTED NOT DETECTED Final   Streptococcus agalactiae NOT  DETECTED NOT DETECTED Final  Streptococcus pneumoniae NOT DETECTED NOT DETECTED Final   Streptococcus pyogenes NOT DETECTED NOT DETECTED Final   Acinetobacter baumannii NOT DETECTED NOT DETECTED Final   Enterobacteriaceae species DETECTED (A) NOT DETECTED Final    Comment: CRITICAL RESULT CALLED TO, READ BACK BY AND VERIFIED WITH: MATT MCBANE AT 0307 ON 03/16/17 [facility]. Enterobacteriaceae represent a large family of gram-negative bacteria, not a single organism.    Enterobacter cloacae complex NOT DETECTED NOT DETECTED Final   Escherichia coli NOT DETECTED NOT DETECTED Final   Klebsiella oxytoca DETECTED (A) NOT DETECTED Final    Comment: CRITICAL RESULT CALLED TO, READ BACK BY AND VERIFIED WITH: MATT MCBANE AT 0307 ON 03/16/17 [facility].    Klebsiella pneumoniae NOT DETECTED NOT DETECTED Final   Proteus species NOT DETECTED NOT DETECTED Final   Serratia marcescens NOT DETECTED NOT DETECTED Final   Carbapenem resistance NOT DETECTED NOT DETECTED Final   Haemophilus influenzae NOT DETECTED NOT DETECTED Final   Neisseria meningitidis NOT DETECTED NOT DETECTED Final   Pseudomonas aeruginosa NOT DETECTED NOT DETECTED Final   Candida albicans NOT DETECTED NOT DETECTED Final   Candida glabrata NOT DETECTED NOT DETECTED Final   Candida krusei NOT DETECTED NOT DETECTED Final   Candida parapsilosis NOT DETECTED NOT DETECTED Final   Candida tropicalis NOT DETECTED NOT DETECTED Final    Comment: Performed at [facility], [address] [place], [place] [address]  Aerobic/Anaerobic Culture (surgical/deep wound)     Status: None   Collection Time: 03/15/17 11:15 AM  Result Value Ref Range Status   Specimen Description   Final    LEG Performed at [facility], [address] [place], [place] [address]    Special Requests   Final    NONE Performed at [facility], [address] [place], [place] [address]    Gram Stain   Final    RARE WBC PRESENT, PREDOMINANTLY  PMN ABUNDANT GRAM NEGATIVE RODS RARE GRAM POSITIVE COCCI    Culture   Final    ABUNDANT KLEBSIELLA OXYTOCA MODERATE STENOTROPHOMONAS MALTOPHILIA NO ANAEROBES ISOLATED Performed at [facility], [address] [place], [place] [address]    Report Status 03/19/2017 FINAL  Final   Organism ID, Bacteria STENOTROPHOMONAS MALTOPHILIA  Final      Susceptibility   Stenotrophomonas maltophilia - MIC*    LEVOFLOXACIN 0.25 SENSITIVE Sensitive     TRIMETH/SULFA <=20 SENSITIVE Sensitive     * MODERATE STENOTROPHOMONAS MALTOPHILIA  MRSA PCR Screening     Status: None   Collection Time: 03/15/17  6:30 PM  Result Value Ref Range Status   MRSA by PCR NEGATIVE NEGATIVE Final    Comment:        The GeneXpert MRSA Assay (FDA approved for NASAL specimens only), is one component of a comprehensive MRSA colonization surveillance program. It is not intended to diagnose MRSA infection nor to guide or monitor treatment for MRSA infections. Performed at [facility], [address] [place], [place] [address]   Cath Tip Culture     Status: None   Collection Time: 03/18/17  2:15 PM  Result Value Ref Range Status   Specimen Description   Final    CATH TIP Performed at [facility], [address] [place], [place] [address]    Special Requests   Final    NONE Performed at [facility], [address] [place], [place] [address]    Culture   Final    NO GROWTH  3 DAYS Performed at [facility], [address] [place], [place] [address]    Report Status 03/22/2017 FINAL  Final    RADIOLOGY:  No results found.  EKG:   Orders placed or performed during the hospital encounter of 03/15/17  . ED EKG 12-Lead  . ED EKG 12-Lead  . EKG 12-Lead  . EKG 12-Lead      Management plans discussed with the patient, family and they are in agreement.  CODE STATUS:     Code Status Orders  (From admission, onward)        Start     Ordered   03/22/17  1630  Do not attempt resuscitation (DNR)  Continuous    Question Answer Comment  In the event of cardiac or respiratory ARREST Do not call a "code blue"   In the event of cardiac or respiratory ARREST Do not perform Intubation, CPR, defibrillation or ACLS   In the event of cardiac or respiratory ARREST Use medication by any route, position, wound care, and other measures to relive pain and suffering. May use oxygen, suction and manual treatment of airway obstruction as needed for comfort.      03/22/17 1629    Code Status History    Date Active Date Inactive Code Status Order ID Comments User Context   03/15/2017 13:44 03/22/2017 16:29 Full Code [id number]  Adrian Saran, MD Inpatient   02/12/2017 01:03 02/18/2017 19:32 Full Code [id number]  Bertrum Sol, MD Inpatient   02/09/2017 16:28 02/11/2017 12:29 Full Code [id number]  Shaune Pollack, MD Inpatient   04/02/2016 12:41 04/04/2016 22:49 Full Code [id number]  Arnaldo Natal, MD ED      TOTAL TIME TAKING CARE OF THIS PATIENT: 43 minutes.   Note: This dictation was prepared with Dragon dictation along with smaller phrase technology. Any transcriptional errors that result from this process are unintentional.   @[facility] @  on 03/23/2017 at 11:29 AM  Between 7am to 6pm - Pager - [phone]  After 6pm go to [email or web address] - password [email or web address] [facility]  [place] [facility]  Office  [phone]  CC: Primary care physician; Derwood Kaplan, MD

## 2017-03-23 NOTE — Progress Notes (Signed)
Patient remains lethargic, will awaken to voice, but quickly falls back to sleep. He did eat bites of his lunch and breakfast this morning. Writer met in the family room with patient's daughter's [place] and [name] to initiate education regarding hospice services, philosophy and team approach to care with good understanding voiced. Questions answered, consents signed.hospital care team updated. Report called to the hospice home, EMS notified for transport. Discharge summary faxed to referral.  Flo Shanks RN, BSN, [facility] [place], hospital Liaison [phone]

## 2017-03-23 NOTE — Progress Notes (Signed)
Plan is for discharge to the hospice home today. Writer spoke on the phone with patient's daughter GrenadaBrittany, she plans on coming to the hospital this afternoon and will meet with Clinical research associatewriter. Hospital care team updated. Full note to follow Dayna BarkerKaren Robertson RN, BSN, [facility] [place], hospital liaison [phone]

## 2017-03-23 NOTE — Progress Notes (Signed)
Patient will D[facility]. [facility] is aware of above. Clinical Child psychotherapistocial Worker (CSW) completed EMS form including DNR. Please reconsult if future social work needs arise. CSW signing off.   [employer] Hughes IncorporatedBailey Chandni Gagan, LCSW [phone]

## 2017-03-23 NOTE — Progress Notes (Signed)
Patient discharged to hospice home per MD order. Transported by EMS.

## 2017-04-20 DEATH — deceased

## 2017-06-12 ENCOUNTER — Ambulatory Visit: Payer: Self-pay | Admitting: Internal Medicine

## 2018-01-25 ENCOUNTER — Other Ambulatory Visit (INDEPENDENT_AMBULATORY_CARE_PROVIDER_SITE_OTHER): Payer: Medicare Other

## 2018-01-25 ENCOUNTER — Ambulatory Visit (INDEPENDENT_AMBULATORY_CARE_PROVIDER_SITE_OTHER): Payer: 59 | Admitting: Vascular Surgery

## 2019-01-31 IMAGING — CR DG CHEST 2V
2 series · 2 of 2 positions shown · non-contrast
Comparison: 04/02/2016

CLINICAL DATA: Sepsis.  Syncopal episode today.

EXAM:
CHEST  2 VIEW

[chest lat]
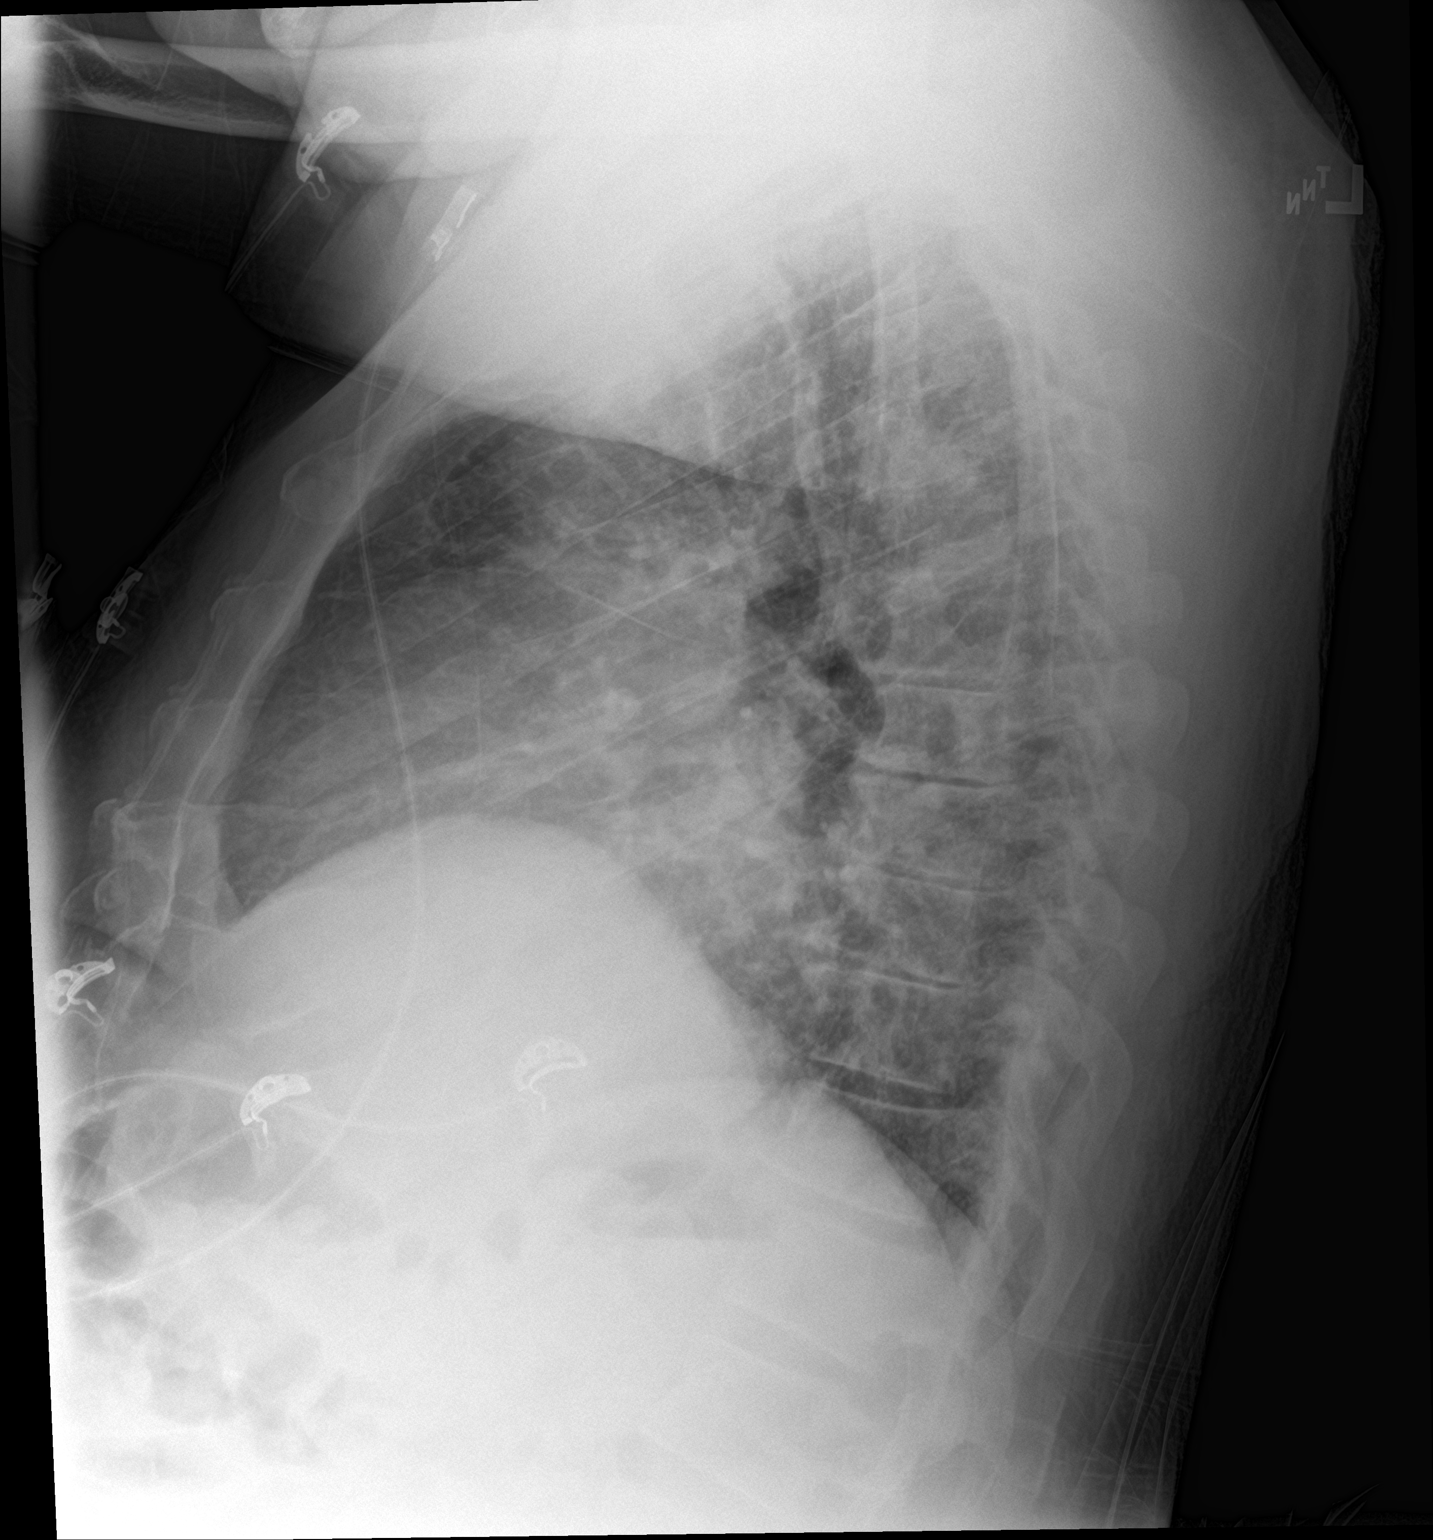

[chest ap]
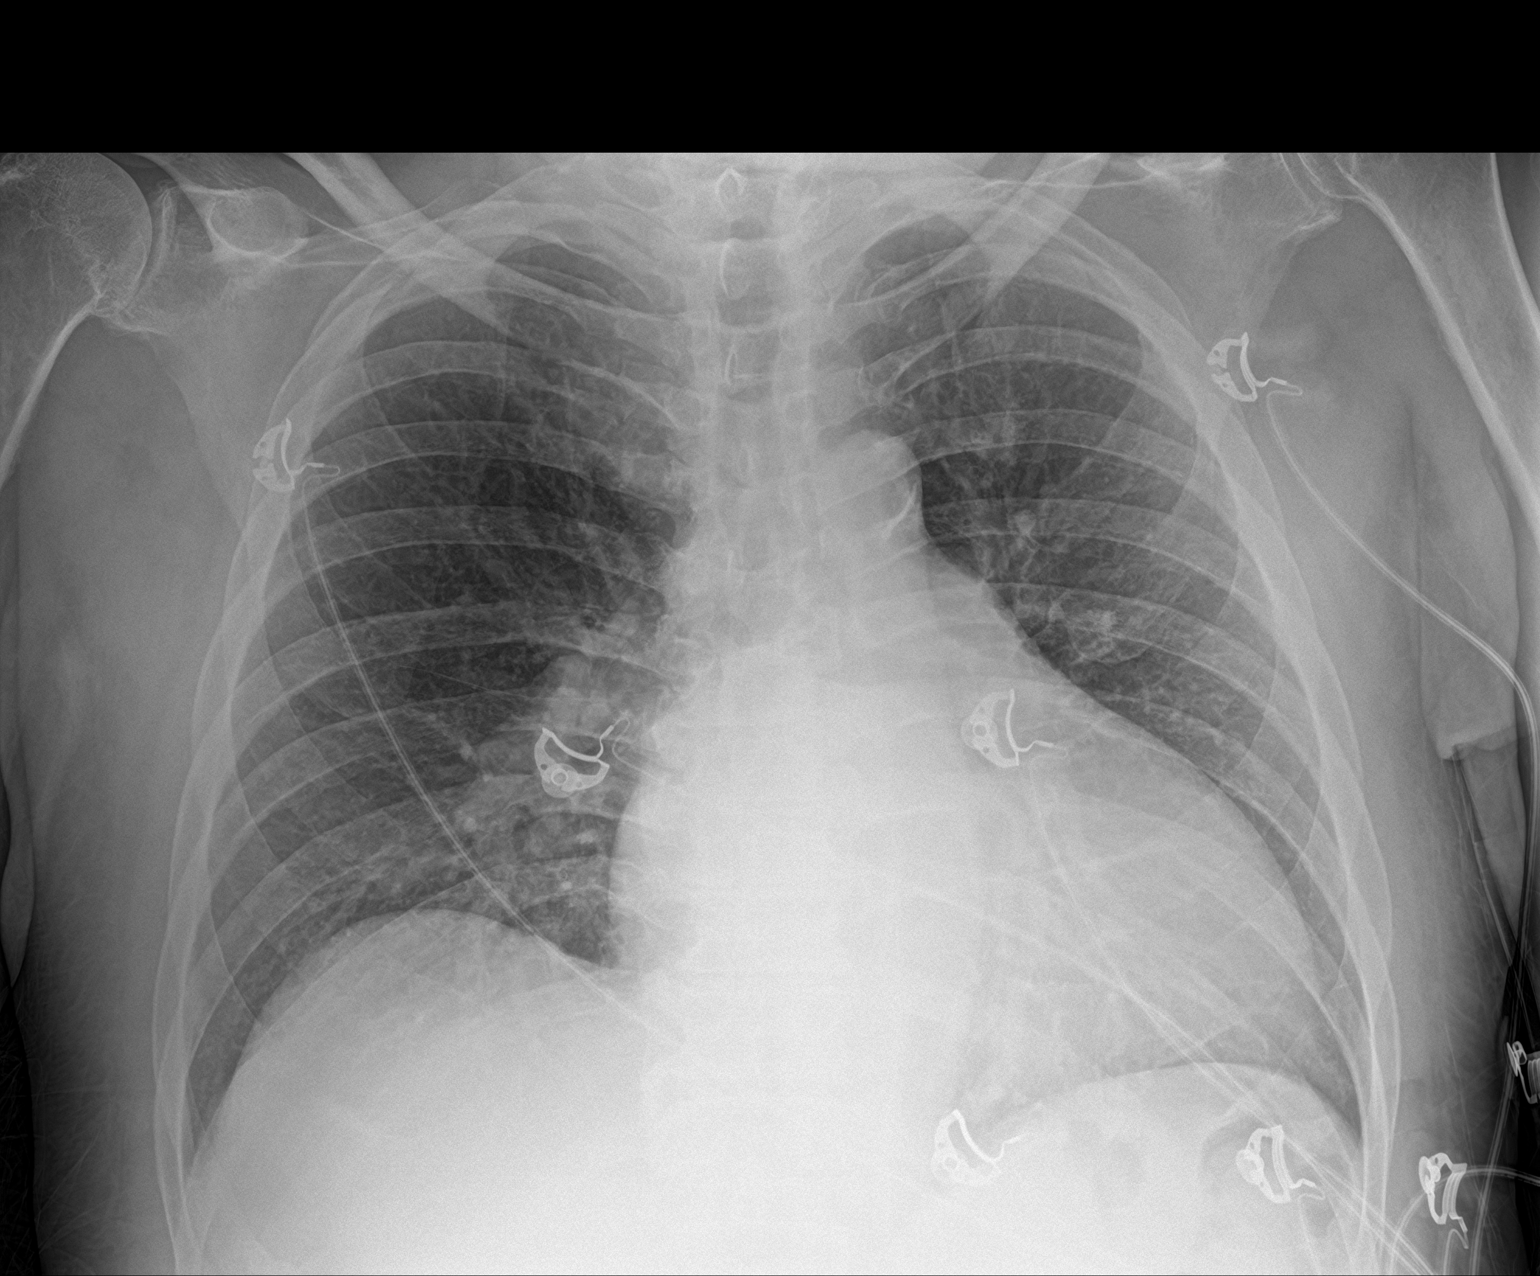

[2 of 2 positions shown; findings below may reference images not displayed]

FINDINGS: Mild enlargement of the cardiopericardial silhouette, stable. No
mediastinal or hilar masses. No evidence of adenopathy.

Clear lungs.  No pleural effusion or pneumothorax.

Skeletal structures are intact.
IMPRESSION: 1. No acute cardiopulmonary disease.
2. Stable cardiomegaly.

## 2019-03-06 IMAGING — DX DG CHEST 1V PORT
1 series · 1 of 1 positions shown · non-contrast
Comparison: 02/09/2017

CLINICAL DATA: Fever and altered mental status. PICC line.
Cellulitis.

EXAM:
PORTABLE CHEST 1 VIEW

[chest ap]
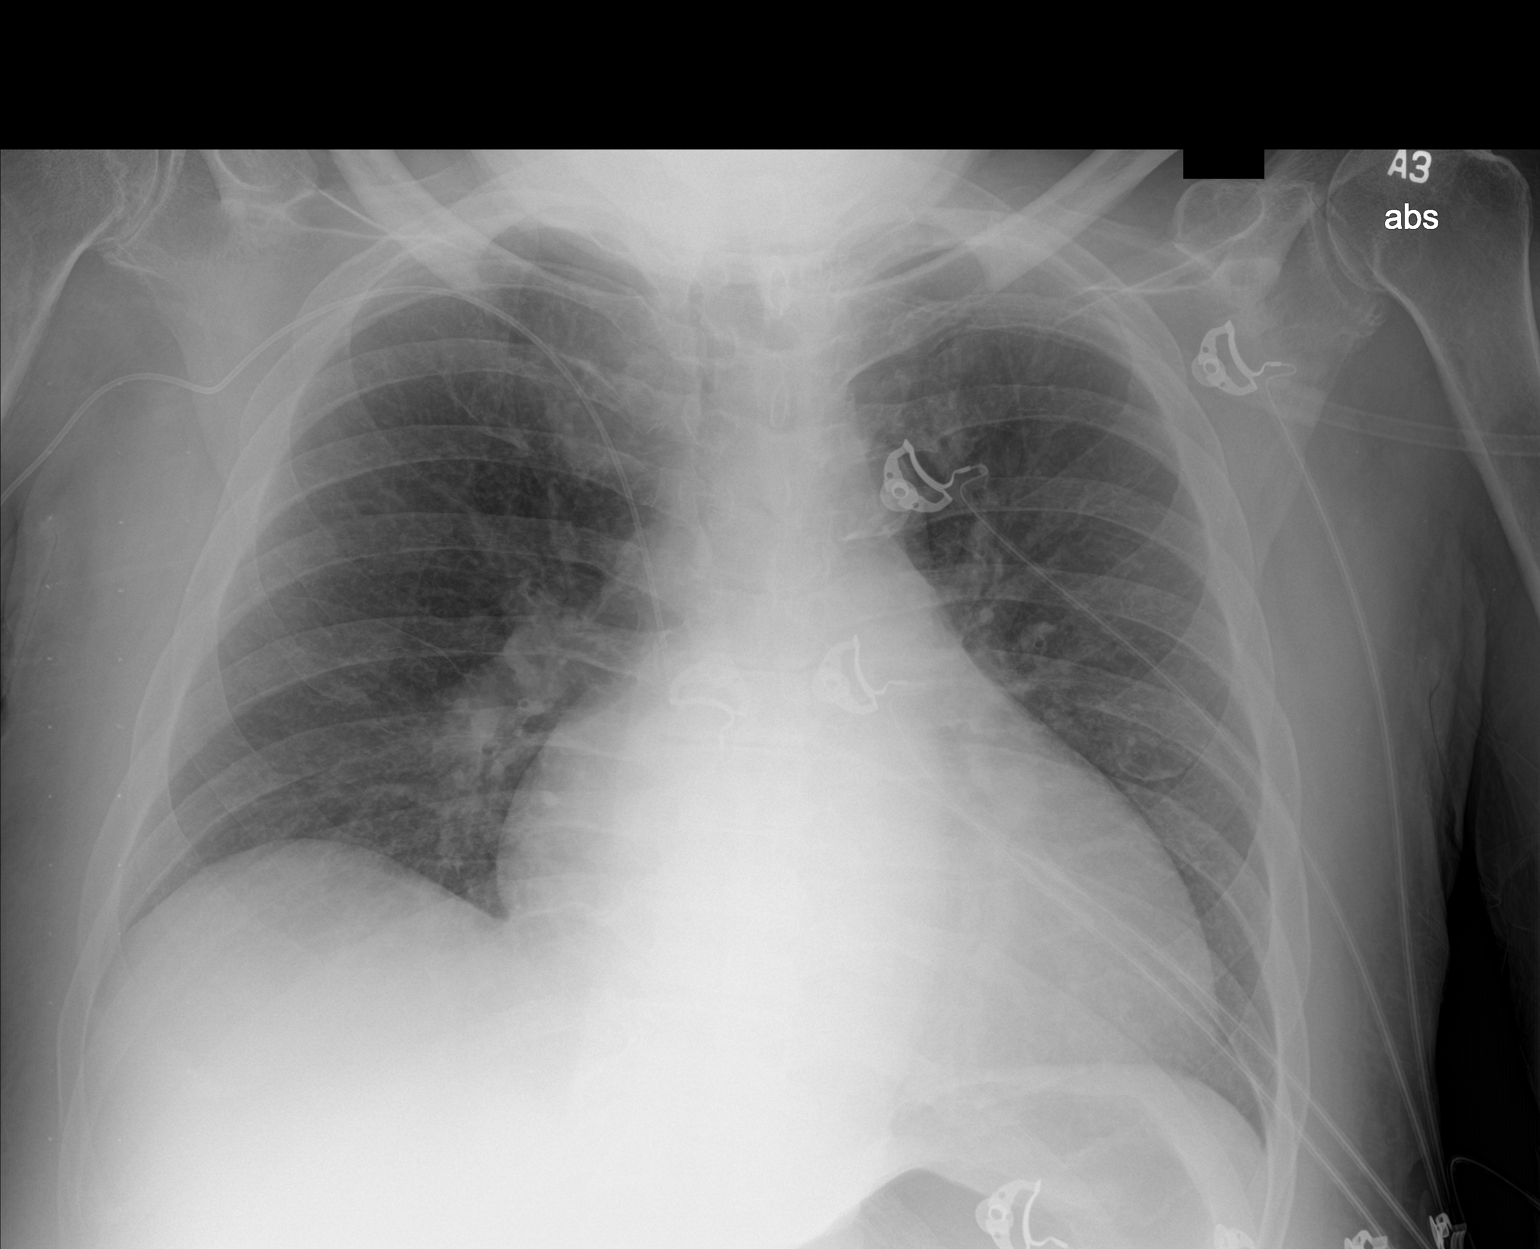

[1 of 1 positions shown; findings below may reference images not displayed]

FINDINGS: Chronic cardiomegaly. Right arm PICC tip in the SVC 3 cm above the
right atrium. The lungs are clear. No edema or effusions. No
significant bone finding.
IMPRESSION: Chronic cardiomegaly.  No active disease.  Right arm PICC.

## 2019-03-06 IMAGING — DX DG TIBIA/FIBULA 2V*L*
4 series · 4 of 4 positions shown · non-contrast
Comparison: None.

CLINICAL DATA: Altered mental status.  Soft tissue infection.

EXAM:
LEFT TIBIA AND FIBULA - 2 VIEW

[tibia ap (1 of 2)]
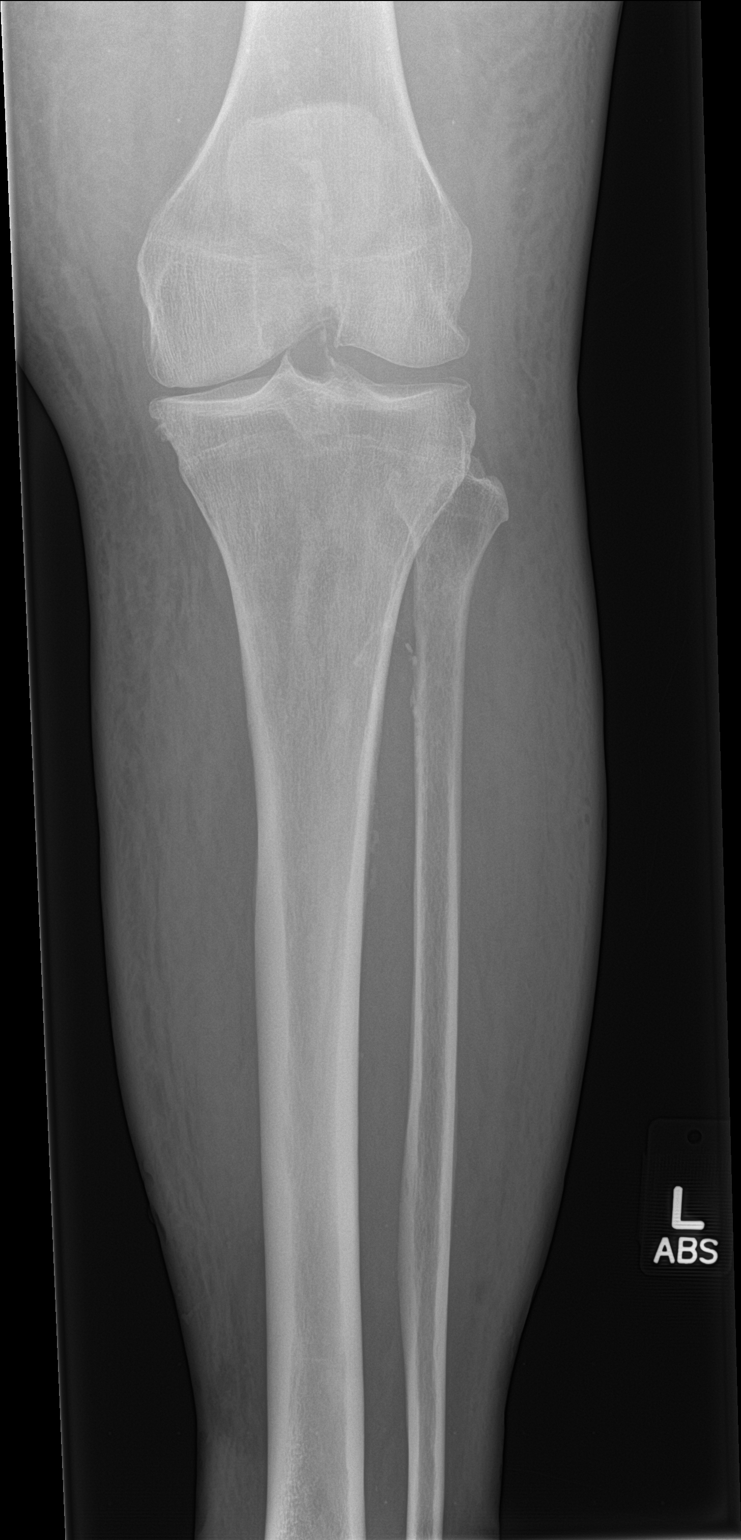

[tibia ap (2 of 2)]
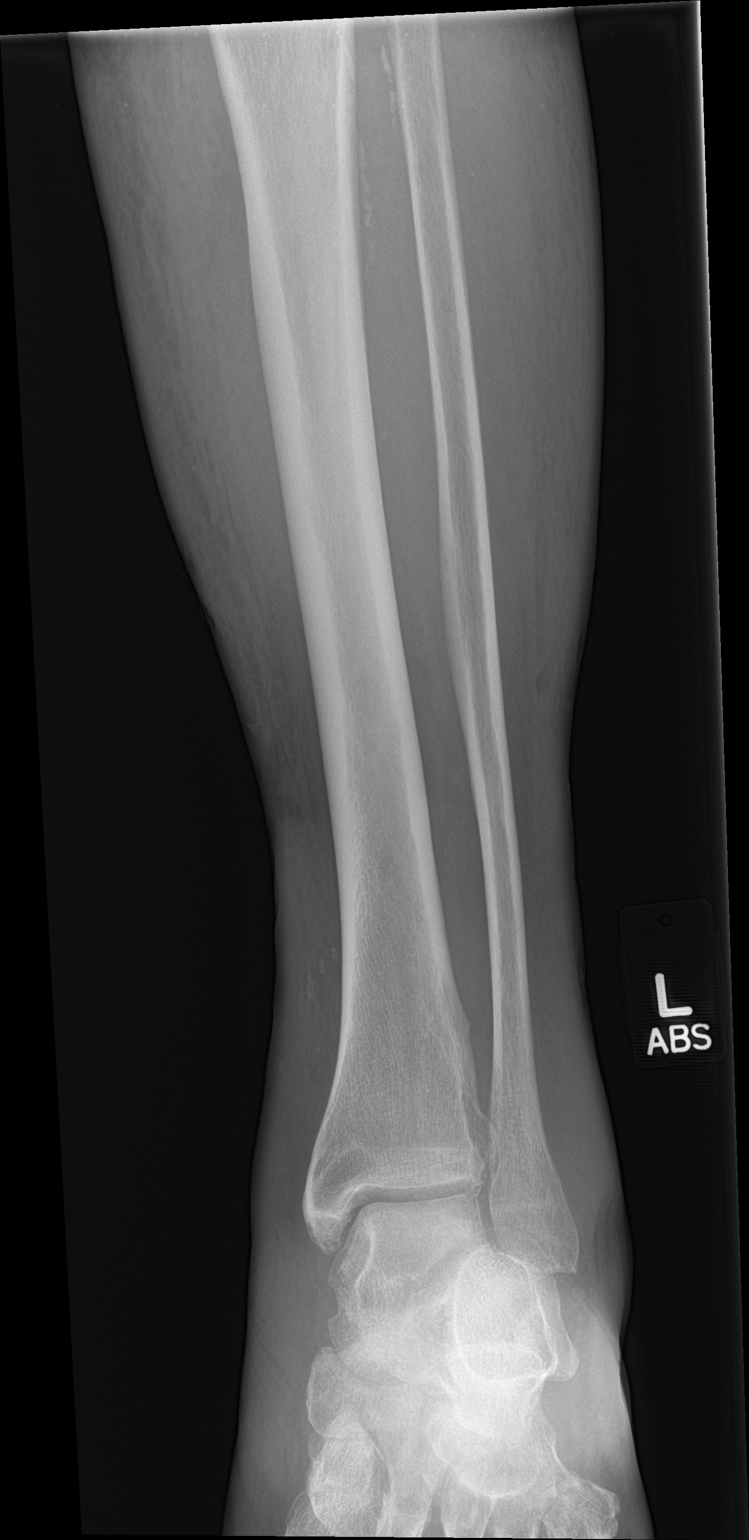

[tibia lat (1 of 2)]
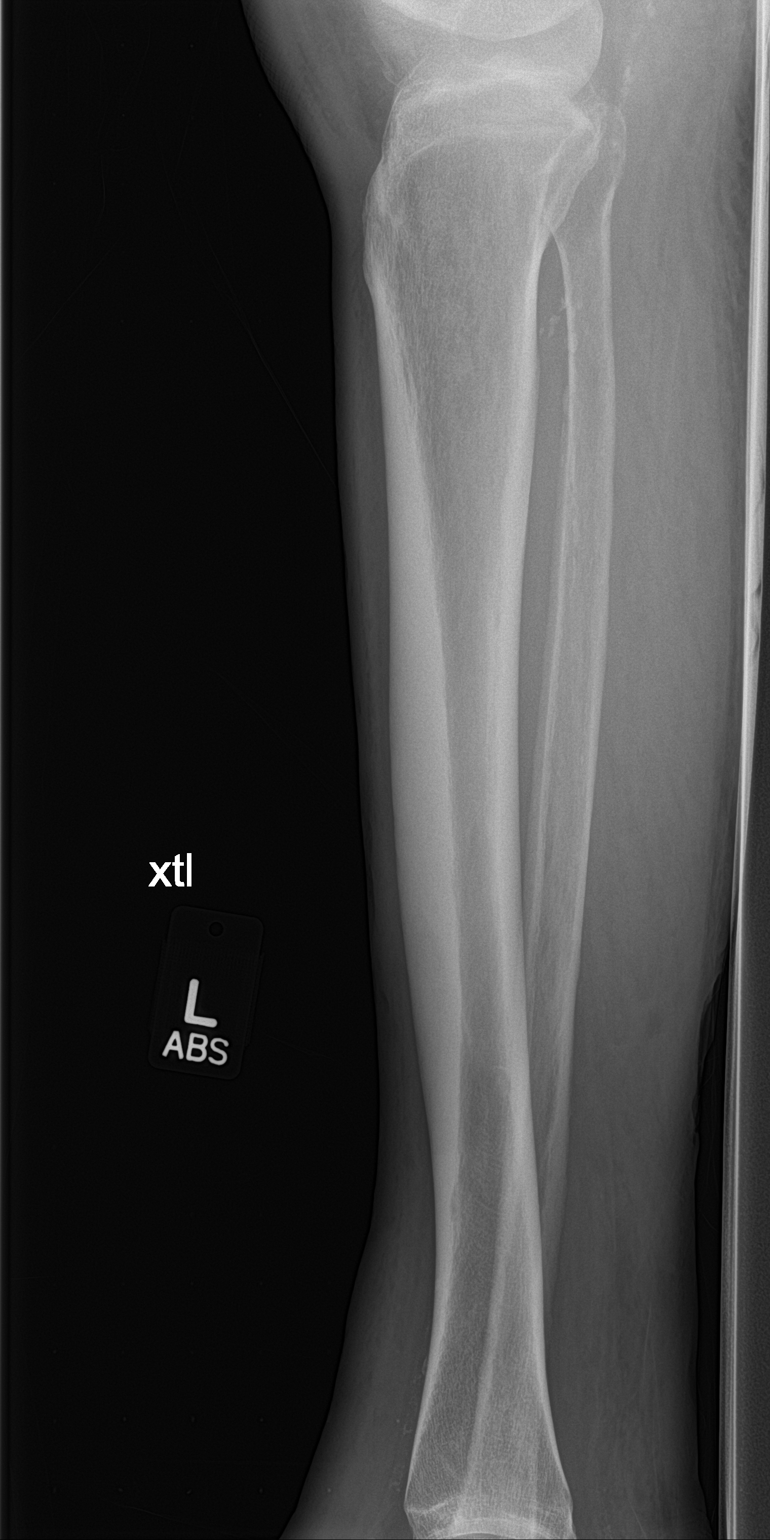

[tibia lat (2 of 2)]
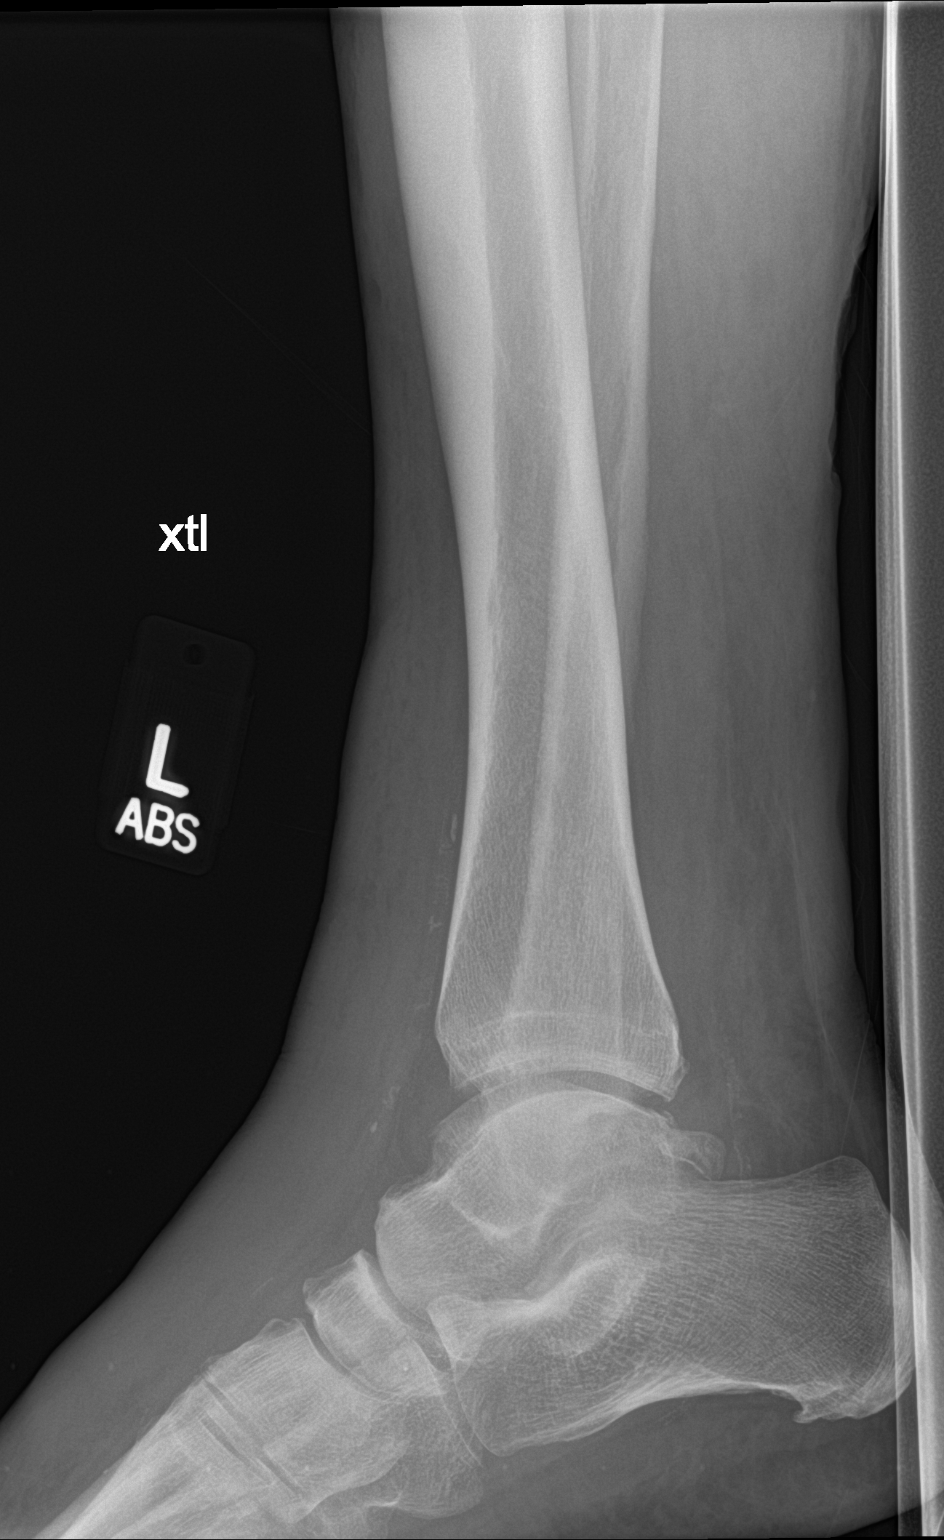

[4 of 4 positions shown; findings below may reference images not displayed]

FINDINGS: Evidence of fracture or osteomyelitis. There is soft tissue
swelling. There are arterial and venous calcifications.
IMPRESSION: No bone abnormality seen.  Soft tissue swelling.
